# Patient Record
Sex: Male | Born: 1950 | Race: White | Hispanic: No | Marital: Married | Smoking: Former smoker
Health system: Southern US, Community
[De-identification: ages and names within clinical notes are randomized; demographics above are authoritative.]

## PROBLEM LIST (undated history)

## (undated) DIAGNOSIS — I5032 Chronic diastolic (congestive) heart failure: Secondary | ICD-10-CM

## (undated) DIAGNOSIS — I2510 Atherosclerotic heart disease of native coronary artery without angina pectoris: Secondary | ICD-10-CM

## (undated) DIAGNOSIS — Z9889 Other specified postprocedural states: Secondary | ICD-10-CM

## (undated) DIAGNOSIS — M1990 Unspecified osteoarthritis, unspecified site: Secondary | ICD-10-CM

## (undated) DIAGNOSIS — I10 Essential (primary) hypertension: Secondary | ICD-10-CM

## (undated) DIAGNOSIS — I2699 Other pulmonary embolism without acute cor pulmonale: Secondary | ICD-10-CM

## (undated) DIAGNOSIS — T8859XA Other complications of anesthesia, initial encounter: Secondary | ICD-10-CM

## (undated) DIAGNOSIS — R112 Nausea with vomiting, unspecified: Secondary | ICD-10-CM

## (undated) DIAGNOSIS — T4145XA Adverse effect of unspecified anesthetic, initial encounter: Secondary | ICD-10-CM

## (undated) DIAGNOSIS — R0602 Shortness of breath: Secondary | ICD-10-CM

## (undated) HISTORY — DX: Atherosclerotic heart disease of native coronary artery without angina pectoris: I25.10

## (undated) HISTORY — PX: ANKLE SURGERY: SHX546

## (undated) HISTORY — DX: Unspecified osteoarthritis, unspecified site: M19.90

## (undated) HISTORY — PX: FOOT SURGERY: SHX648

## (undated) HISTORY — DX: Essential (primary) hypertension: I10

## (undated) HISTORY — PX: CORONARY ANGIOPLASTY WITH STENT PLACEMENT: SHX49

## (undated) HISTORY — PX: TOTAL KNEE ARTHROPLASTY: SHX125

## (undated) HISTORY — PX: CARPAL TUNNEL RELEASE: SHX101

## (undated) HISTORY — PX: ROTATOR CUFF REPAIR: SHX139

## (undated) HISTORY — DX: Chronic diastolic (congestive) heart failure: I50.32

## (undated) HISTORY — PX: THORACOTOMY: SUR1349

## (undated) HISTORY — PX: PACEMAKER INSERTION: SHX728

## (undated) HISTORY — PX: BACK SURGERY: SHX140

---

## 2002-11-23 ENCOUNTER — Encounter: Payer: Self-pay | Admitting: Orthopaedic Surgery

## 2002-11-23 ENCOUNTER — Ambulatory Visit (HOSPITAL_COMMUNITY): Admission: RE | Admit: 2002-11-23 | Discharge: 2002-11-23 | Payer: Self-pay | Admitting: Orthopaedic Surgery

## 2002-12-26 ENCOUNTER — Encounter: Payer: Self-pay | Admitting: Orthopaedic Surgery

## 2002-12-28 ENCOUNTER — Inpatient Hospital Stay (HOSPITAL_COMMUNITY): Admission: RE | Admit: 2002-12-28 | Discharge: 2003-01-01 | Payer: Self-pay | Admitting: Orthopaedic Surgery

## 2002-12-28 ENCOUNTER — Encounter: Payer: Self-pay | Admitting: Orthopaedic Surgery

## 2003-01-01 ENCOUNTER — Encounter: Payer: Self-pay | Admitting: Orthopaedic Surgery

## 2005-06-13 ENCOUNTER — Ambulatory Visit: Payer: Self-pay | Admitting: Cardiovascular Disease

## 2005-06-14 ENCOUNTER — Ambulatory Visit: Payer: Self-pay | Admitting: Internal Medicine

## 2005-06-14 ENCOUNTER — Inpatient Hospital Stay (HOSPITAL_COMMUNITY): Admission: AD | Admit: 2005-06-14 | Discharge: 2005-06-24 | Payer: Self-pay | Admitting: Internal Medicine

## 2005-06-15 ENCOUNTER — Encounter: Payer: Self-pay | Admitting: Cardiology

## 2005-06-15 ENCOUNTER — Ambulatory Visit: Payer: Self-pay | Admitting: Cardiology

## 2006-06-12 ENCOUNTER — Ambulatory Visit: Payer: Self-pay | Admitting: Critical Care Medicine

## 2006-06-12 ENCOUNTER — Inpatient Hospital Stay (HOSPITAL_COMMUNITY): Admission: EM | Admit: 2006-06-12 | Discharge: 2006-07-14 | Payer: Self-pay | Admitting: *Deleted

## 2006-06-12 ENCOUNTER — Ambulatory Visit: Payer: Self-pay | Admitting: Cardiology

## 2006-06-14 ENCOUNTER — Encounter (INDEPENDENT_AMBULATORY_CARE_PROVIDER_SITE_OTHER): Payer: Self-pay | Admitting: *Deleted

## 2006-06-14 ENCOUNTER — Encounter: Payer: Self-pay | Admitting: Cardiology

## 2006-06-29 ENCOUNTER — Ambulatory Visit: Payer: Self-pay | Admitting: Internal Medicine

## 2006-07-01 ENCOUNTER — Encounter: Payer: Self-pay | Admitting: Cardiology

## 2006-07-20 ENCOUNTER — Ambulatory Visit: Payer: Self-pay | Admitting: Cardiology

## 2006-07-20 ENCOUNTER — Inpatient Hospital Stay (HOSPITAL_COMMUNITY): Admission: EM | Admit: 2006-07-20 | Discharge: 2006-07-25 | Payer: Self-pay | Admitting: Emergency Medicine

## 2006-07-28 ENCOUNTER — Encounter: Admission: RE | Admit: 2006-07-28 | Discharge: 2006-07-28 | Payer: Self-pay | Admitting: Thoracic Surgery

## 2006-08-02 ENCOUNTER — Ambulatory Visit: Payer: Self-pay | Admitting: Cardiology

## 2006-08-25 ENCOUNTER — Ambulatory Visit: Payer: Self-pay | Admitting: Cardiology

## 2006-09-03 ENCOUNTER — Ambulatory Visit: Payer: Self-pay | Admitting: Cardiology

## 2006-09-08 ENCOUNTER — Ambulatory Visit: Payer: Self-pay | Admitting: Cardiology

## 2006-09-08 ENCOUNTER — Encounter: Admission: RE | Admit: 2006-09-08 | Discharge: 2006-09-08 | Payer: Self-pay | Admitting: Thoracic Surgery

## 2006-09-15 ENCOUNTER — Ambulatory Visit: Payer: Self-pay | Admitting: Cardiology

## 2006-10-12 ENCOUNTER — Ambulatory Visit: Payer: Self-pay | Admitting: Internal Medicine

## 2006-10-12 ENCOUNTER — Ambulatory Visit: Payer: Self-pay | Admitting: Cardiology

## 2006-10-19 ENCOUNTER — Ambulatory Visit: Payer: Self-pay | Admitting: Cardiology

## 2006-10-26 ENCOUNTER — Ambulatory Visit: Payer: Self-pay | Admitting: Cardiology

## 2006-11-05 ENCOUNTER — Ambulatory Visit: Payer: Self-pay | Admitting: Cardiology

## 2006-11-11 ENCOUNTER — Ambulatory Visit: Payer: Self-pay | Admitting: Cardiology

## 2006-11-25 ENCOUNTER — Ambulatory Visit: Payer: Self-pay | Admitting: Cardiology

## 2006-11-26 ENCOUNTER — Ambulatory Visit: Payer: Self-pay | Admitting: Thoracic Surgery

## 2006-11-30 ENCOUNTER — Encounter: Payer: Self-pay | Admitting: Internal Medicine

## 2006-12-08 ENCOUNTER — Ambulatory Visit: Payer: Self-pay | Admitting: Internal Medicine

## 2006-12-08 ENCOUNTER — Ambulatory Visit (HOSPITAL_COMMUNITY): Admission: RE | Admit: 2006-12-08 | Discharge: 2006-12-08 | Payer: Self-pay | Admitting: Neurosurgery

## 2006-12-21 ENCOUNTER — Ambulatory Visit: Payer: Self-pay | Admitting: Thoracic Surgery

## 2006-12-21 ENCOUNTER — Encounter: Admission: RE | Admit: 2006-12-21 | Discharge: 2006-12-21 | Payer: Self-pay | Admitting: Thoracic Surgery

## 2006-12-29 ENCOUNTER — Ambulatory Visit: Payer: Self-pay | Admitting: Cardiology

## 2006-12-29 LAB — CONVERTED CEMR LAB
BUN: 23 mg/dL (ref 6–23)
CO2: 31 meq/L (ref 19–32)
Calcium: 9.5 mg/dL (ref 8.4–10.5)
Chloride: 102 meq/L (ref 96–112)
Creatinine, Ser: 0.8 mg/dL (ref 0.4–1.5)
GFR calc Af Amer: 129 mL/min
GFR calc non Af Amer: 107 mL/min
Glucose, Bld: 106 mg/dL — ABNORMAL HIGH (ref 70–99)
Potassium: 4.2 meq/L (ref 3.5–5.1)
Sodium: 140 meq/L (ref 135–145)

## 2007-01-11 ENCOUNTER — Ambulatory Visit: Payer: Self-pay | Admitting: Cardiology

## 2007-01-11 ENCOUNTER — Ambulatory Visit: Payer: Self-pay | Admitting: Internal Medicine

## 2007-01-11 DIAGNOSIS — S82899A Other fracture of unspecified lower leg, initial encounter for closed fracture: Secondary | ICD-10-CM | POA: Insufficient documentation

## 2007-01-11 DIAGNOSIS — M009 Pyogenic arthritis, unspecified: Secondary | ICD-10-CM | POA: Insufficient documentation

## 2007-01-14 ENCOUNTER — Encounter: Payer: Self-pay | Admitting: Internal Medicine

## 2007-01-21 ENCOUNTER — Ambulatory Visit: Payer: Self-pay | Admitting: Internal Medicine

## 2007-01-31 ENCOUNTER — Ambulatory Visit: Payer: Self-pay | Admitting: Cardiology

## 2007-01-31 ENCOUNTER — Ambulatory Visit: Payer: Self-pay | Admitting: Cardiovascular Disease

## 2007-02-14 ENCOUNTER — Ambulatory Visit: Payer: Self-pay | Admitting: Cardiology

## 2007-02-28 ENCOUNTER — Ambulatory Visit: Payer: Self-pay | Admitting: Cardiology

## 2007-03-28 ENCOUNTER — Ambulatory Visit: Payer: Self-pay | Admitting: Cardiology

## 2007-04-11 ENCOUNTER — Ambulatory Visit: Payer: Self-pay | Admitting: Internal Medicine

## 2007-05-10 ENCOUNTER — Ambulatory Visit: Payer: Self-pay | Admitting: Cardiology

## 2007-05-10 LAB — CONVERTED CEMR LAB
BUN: 13 mg/dL (ref 6–23)
Calcium: 9.4 mg/dL (ref 8.4–10.5)
GFR calc Af Amer: 129 mL/min
Potassium: 3.8 meq/L (ref 3.5–5.1)
Sodium: 139 meq/L (ref 135–145)

## 2007-05-12 ENCOUNTER — Ambulatory Visit: Payer: Self-pay | Admitting: Cardiology

## 2007-06-03 ENCOUNTER — Ambulatory Visit: Payer: Self-pay | Admitting: Pulmonary Disease

## 2007-06-06 ENCOUNTER — Ambulatory Visit: Payer: Self-pay | Admitting: Pulmonary Disease

## 2007-06-06 LAB — CONVERTED CEMR LAB
Albumin: 3.8 g/dL (ref 3.5–5.2)
Angiotensin 1 Converting Enzyme: 9 units/L (ref 9–67)
BUN: 14 mg/dL (ref 6–23)
Bilirubin, Direct: 0.1 mg/dL (ref 0.0–0.3)
Chloride: 104 meq/L (ref 96–112)
Creatinine, Ser: 0.7 mg/dL (ref 0.4–1.5)
Eosinophils Absolute: 1.3 10*3/uL — ABNORMAL HIGH (ref 0.0–0.6)
Eosinophils Relative: 15.1 % — ABNORMAL HIGH (ref 0.0–5.0)
GFR calc Af Amer: 151 mL/min
GFR calc non Af Amer: 124 mL/min
Glucose, Bld: 105 mg/dL — ABNORMAL HIGH (ref 70–99)
HCT: 34.4 % — ABNORMAL LOW (ref 39.0–52.0)
MCV: 91.2 fL (ref 78.0–100.0)
Monocytes Absolute: 0.7 10*3/uL (ref 0.2–0.7)
Neutro Abs: 4.6 10*3/uL (ref 1.4–7.7)
Neutrophils Relative %: 53.5 % (ref 43.0–77.0)
Platelets: 170 10*3/uL (ref 150–400)
Prothrombin Time: 13.3 s (ref 10.9–13.3)
Sed Rate: 20 mm/hr (ref 0–20)
Total Bilirubin: 0.8 mg/dL (ref 0.3–1.2)
WBC: 8.7 10*3/uL (ref 4.5–10.5)
aPTT: 26.5 s (ref 21.7–29.8)

## 2007-06-09 ENCOUNTER — Ambulatory Visit: Payer: Self-pay | Admitting: Pulmonary Disease

## 2007-06-09 ENCOUNTER — Encounter: Payer: Self-pay | Admitting: Pulmonary Disease

## 2007-06-09 ENCOUNTER — Ambulatory Visit: Admission: RE | Admit: 2007-06-09 | Discharge: 2007-06-09 | Payer: Self-pay | Admitting: Pulmonary Disease

## 2007-06-20 ENCOUNTER — Ambulatory Visit: Payer: Self-pay

## 2007-06-20 ENCOUNTER — Ambulatory Visit: Payer: Self-pay | Admitting: Cardiology

## 2007-06-22 ENCOUNTER — Ambulatory Visit: Payer: Self-pay | Admitting: Cardiology

## 2007-06-22 LAB — CONVERTED CEMR LAB
BUN: 15 mg/dL (ref 6–23)
Basophils Absolute: 0.1 10*3/uL (ref 0.0–0.1)
CO2: 25 meq/L (ref 19–32)
Calcium: 9.1 mg/dL (ref 8.4–10.5)
Chloride: 106 meq/L (ref 96–112)
Eosinophils Relative: 12.5 % — ABNORMAL HIGH (ref 0.0–5.0)
GFR calc Af Amer: 151 mL/min
HCT: 33.7 % — ABNORMAL LOW (ref 39.0–52.0)
Hemoglobin: 11.6 g/dL — ABNORMAL LOW (ref 13.0–17.0)
INR: 2.8 — ABNORMAL HIGH (ref 0.8–1.0)
MCHC: 34.5 g/dL (ref 30.0–36.0)
Monocytes Absolute: 0.8 10*3/uL — ABNORMAL HIGH (ref 0.2–0.7)
Platelets: 192 10*3/uL (ref 150–400)
Potassium: 4.1 meq/L (ref 3.5–5.1)
Prothrombin Time: 20.9 s — ABNORMAL HIGH (ref 10.9–13.3)
Sodium: 137 meq/L (ref 135–145)
aPTT: 38.4 s — ABNORMAL HIGH (ref 21.7–29.8)

## 2007-06-27 ENCOUNTER — Inpatient Hospital Stay (HOSPITAL_COMMUNITY): Admission: RE | Admit: 2007-06-27 | Discharge: 2007-06-28 | Payer: Self-pay | Admitting: Cardiology

## 2007-06-27 ENCOUNTER — Ambulatory Visit: Payer: Self-pay | Admitting: Cardiology

## 2007-07-05 ENCOUNTER — Ambulatory Visit: Payer: Self-pay | Admitting: Cardiology

## 2007-07-12 ENCOUNTER — Ambulatory Visit: Payer: Self-pay | Admitting: Cardiology

## 2007-08-08 ENCOUNTER — Ambulatory Visit: Payer: Self-pay | Admitting: Cardiology

## 2007-09-19 ENCOUNTER — Inpatient Hospital Stay (HOSPITAL_COMMUNITY): Admission: RE | Admit: 2007-09-19 | Discharge: 2007-09-20 | Payer: Self-pay | Admitting: Neurosurgery

## 2007-10-24 ENCOUNTER — Encounter
Admission: RE | Admit: 2007-10-24 | Discharge: 2008-01-22 | Payer: Self-pay | Admitting: Physical Medicine & Rehabilitation

## 2007-10-24 ENCOUNTER — Ambulatory Visit: Payer: Self-pay | Admitting: Physical Medicine & Rehabilitation

## 2007-10-25 ENCOUNTER — Encounter: Payer: Self-pay | Admitting: Internal Medicine

## 2007-11-04 ENCOUNTER — Ambulatory Visit: Payer: Self-pay | Admitting: Cardiology

## 2007-11-25 ENCOUNTER — Ambulatory Visit: Payer: Self-pay | Admitting: Physical Medicine & Rehabilitation

## 2007-12-12 ENCOUNTER — Ambulatory Visit: Payer: Self-pay | Admitting: Physical Medicine & Rehabilitation

## 2007-12-26 ENCOUNTER — Ambulatory Visit: Payer: Self-pay | Admitting: Physical Medicine & Rehabilitation

## 2008-01-02 ENCOUNTER — Ambulatory Visit: Payer: Self-pay | Admitting: Physical Medicine & Rehabilitation

## 2008-01-24 ENCOUNTER — Encounter: Payer: Self-pay | Admitting: Internal Medicine

## 2008-01-27 ENCOUNTER — Encounter
Admission: RE | Admit: 2008-01-27 | Discharge: 2008-04-09 | Payer: Self-pay | Admitting: Physical Medicine & Rehabilitation

## 2008-01-30 ENCOUNTER — Ambulatory Visit: Payer: Self-pay | Admitting: Physical Medicine & Rehabilitation

## 2008-02-08 ENCOUNTER — Ambulatory Visit: Payer: Self-pay | Admitting: Cardiology

## 2008-02-08 LAB — CONVERTED CEMR LAB
ALT: 28 units/L (ref 0–53)
Albumin: 3.9 g/dL (ref 3.5–5.2)
Alkaline Phosphatase: 67 units/L (ref 39–117)
BUN: 13 mg/dL (ref 6–23)
Basophils Absolute: 0 10*3/uL (ref 0.0–0.1)
Basophils Relative: 0.2 % (ref 0.0–1.0)
Bilirubin, Direct: 0.1 mg/dL (ref 0.0–0.3)
Calcium: 9.5 mg/dL (ref 8.4–10.5)
Chloride: 102 meq/L (ref 96–112)
Eosinophils Relative: 5.1 % — ABNORMAL HIGH (ref 0.0–5.0)
GFR calc Af Amer: 129 mL/min
Glucose, Bld: 116 mg/dL — ABNORMAL HIGH (ref 70–99)
Hemoglobin: 14 g/dL (ref 13.0–17.0)
MCHC: 33.4 g/dL (ref 30.0–36.0)
MCV: 90.7 fL (ref 78.0–100.0)
Monocytes Relative: 7.1 % (ref 3.0–12.0)
Platelets: 197 10*3/uL (ref 150–400)
Total Bilirubin: 1 mg/dL (ref 0.3–1.2)
WBC: 8.7 10*3/uL (ref 4.5–10.5)

## 2008-02-15 IMAGING — CR DG THORACIC SPINE 1V
1 series · 1 of 1 positions shown · non-contrast
Comparison: none

CLINICAL DATA: Intraop film for localization.
THORACIC SPINE - 1 VIEW:
AP view of the thoracic spine was obtained.  Multilevel thoracic spine degenerative disc space narrowing is seen.  No gross compression fracture is appreciated on this AP projection, although evaluation is limited without a lateral view.  Atelectasis or scarring is noted in both lung bases.

[t t-spine a.p. *]
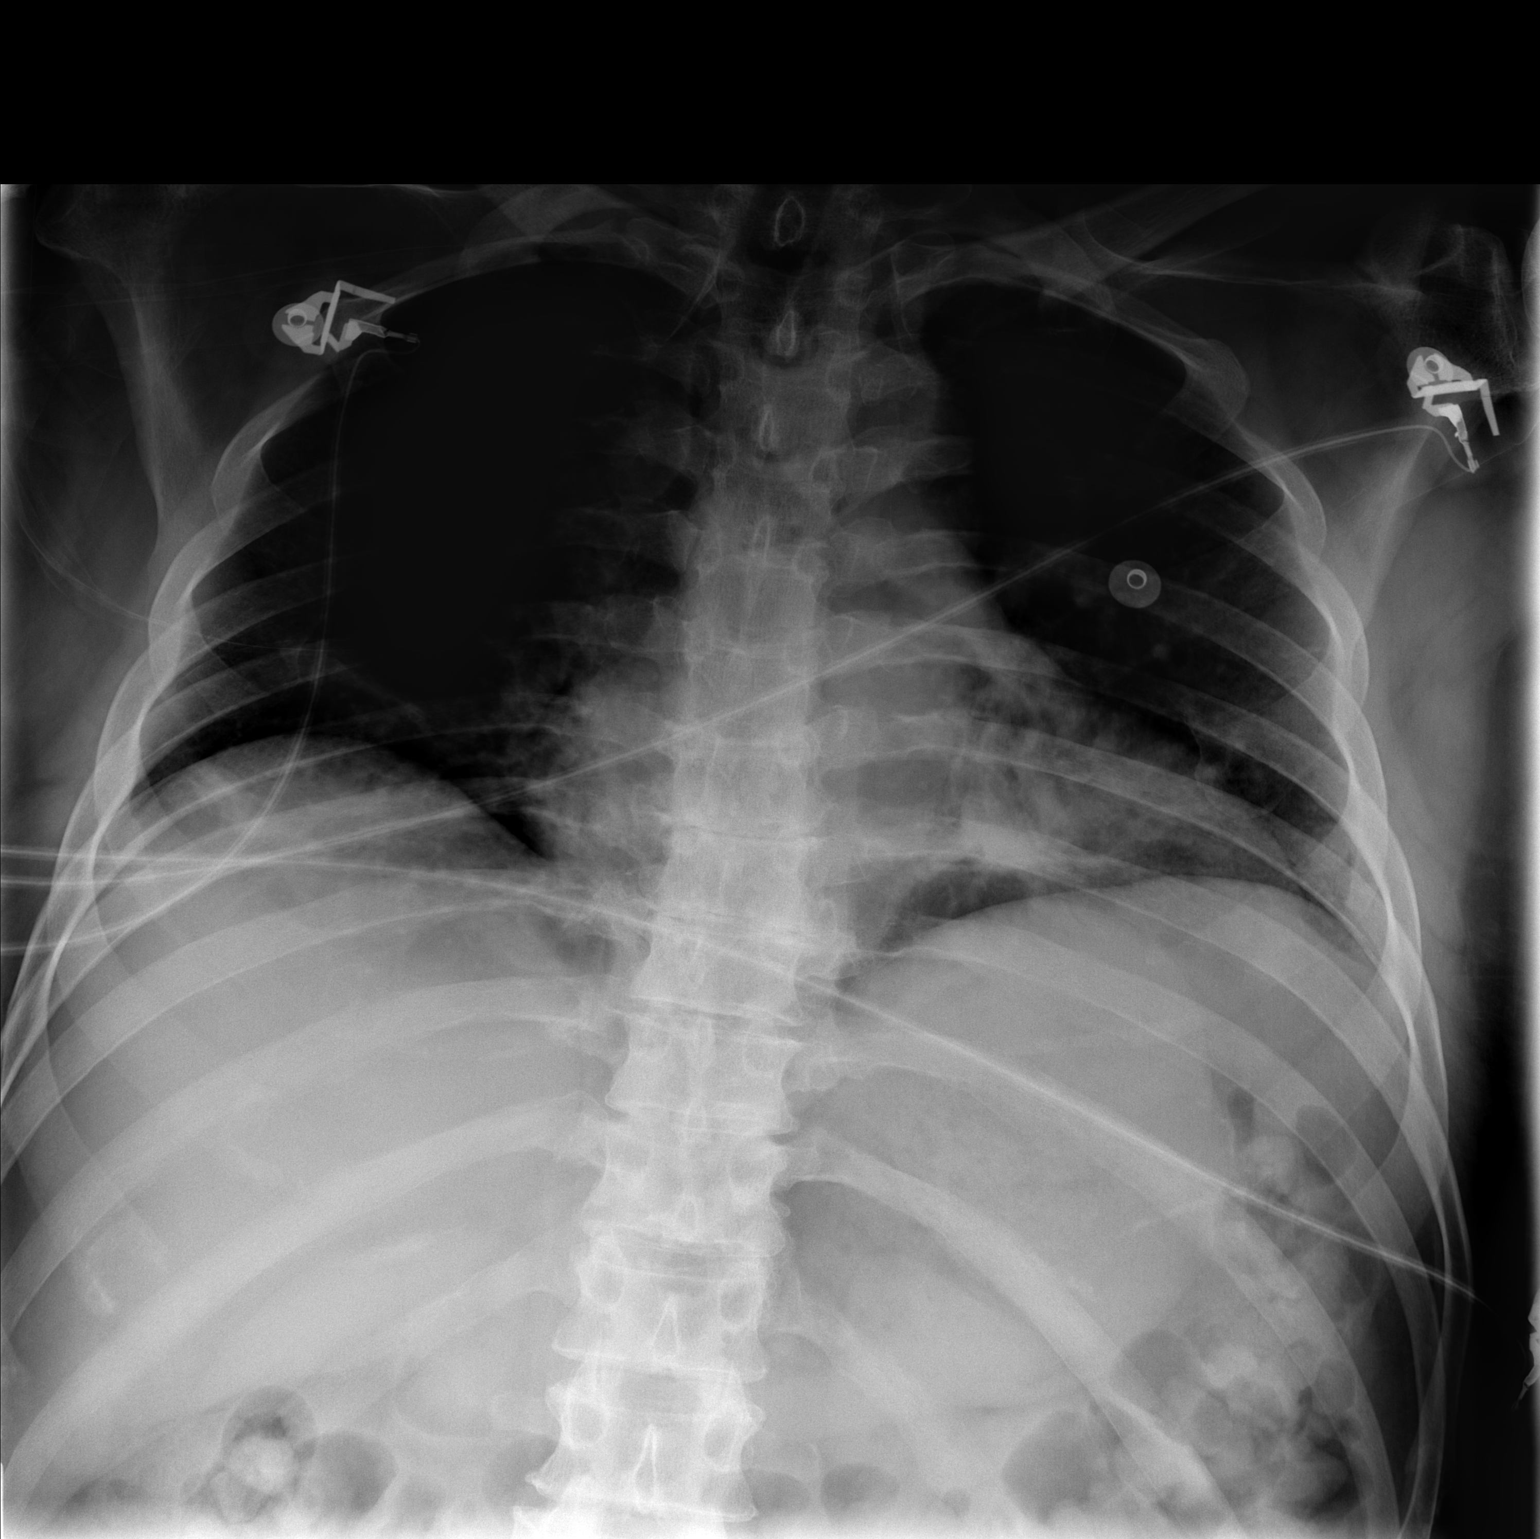

[1 of 1 positions shown; findings below may reference images not displayed]

IMPRESSION: As above.

## 2008-02-16 ENCOUNTER — Encounter: Admission: RE | Admit: 2008-02-16 | Discharge: 2008-05-16 | Payer: Self-pay | Admitting: Anesthesiology

## 2008-02-16 IMAGING — CR DG CHEST 1V PORT
1 series · 1 of 1 positions shown · non-contrast
Comparison: none

CLINICAL DATA: HNP, central line, and CT placement. 
PORTABLE CHEST - 1 VIEW AT 4488 HOURS:

[view not recorded]
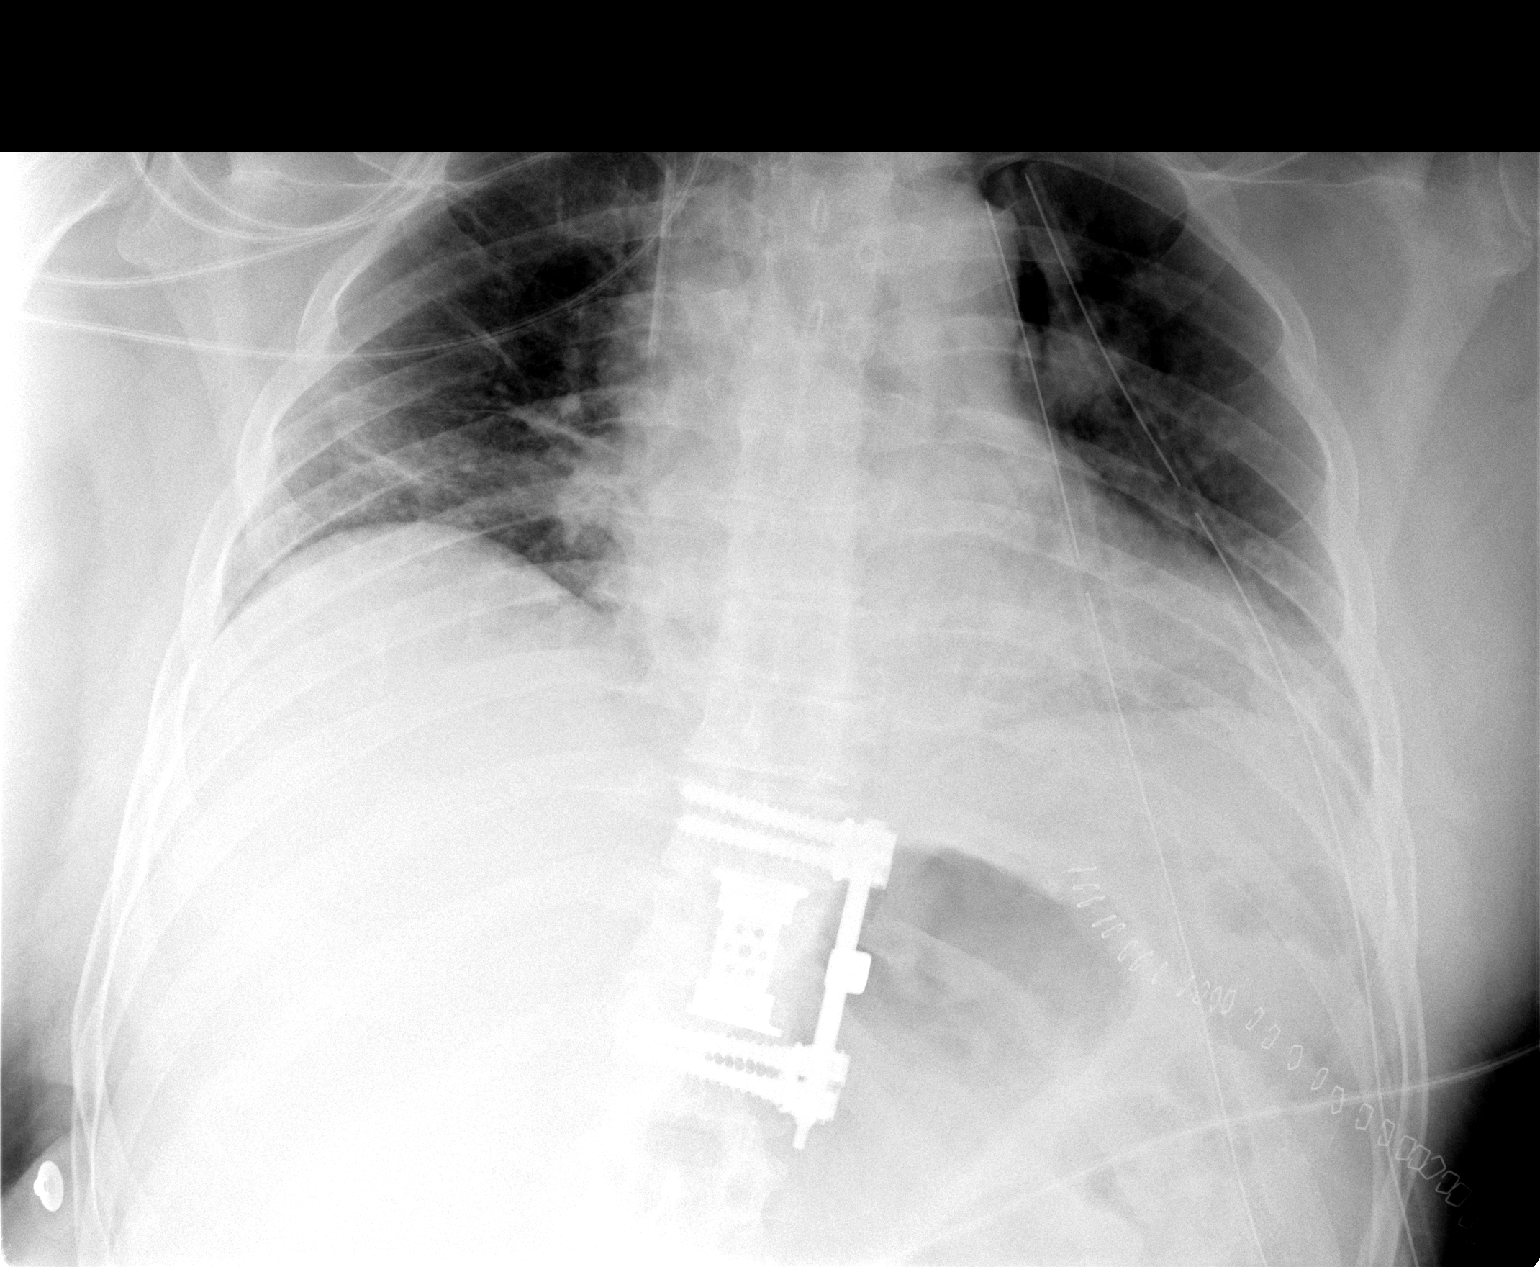

[1 of 1 positions shown; findings below may reference images not displayed]

FINDINGS: There is a corpectomy plug at T11.  Paired horizontally oriented trans-vertebral screws at T10 and T12.  Two left pleural chest tubes.  Left lower chest wall staples. Right jugular CVC tip is in the SVC.  Subsegmental atelectasis at the bases.  No pneumothorax.
IMPRESSION: Subsegmental atelectasis at the bases, mainly on the right.  Status-post T11 corpectomy. See comments above.

## 2008-02-17 IMAGING — CR DG CHEST 1V PORT
1 series · 1 of 1 positions shown · non-contrast
Comparison: Yesterday.

CLINICAL DATA: Pneumonia.

PORTABLE CHEST - 1 VIEW

[view not recorded]
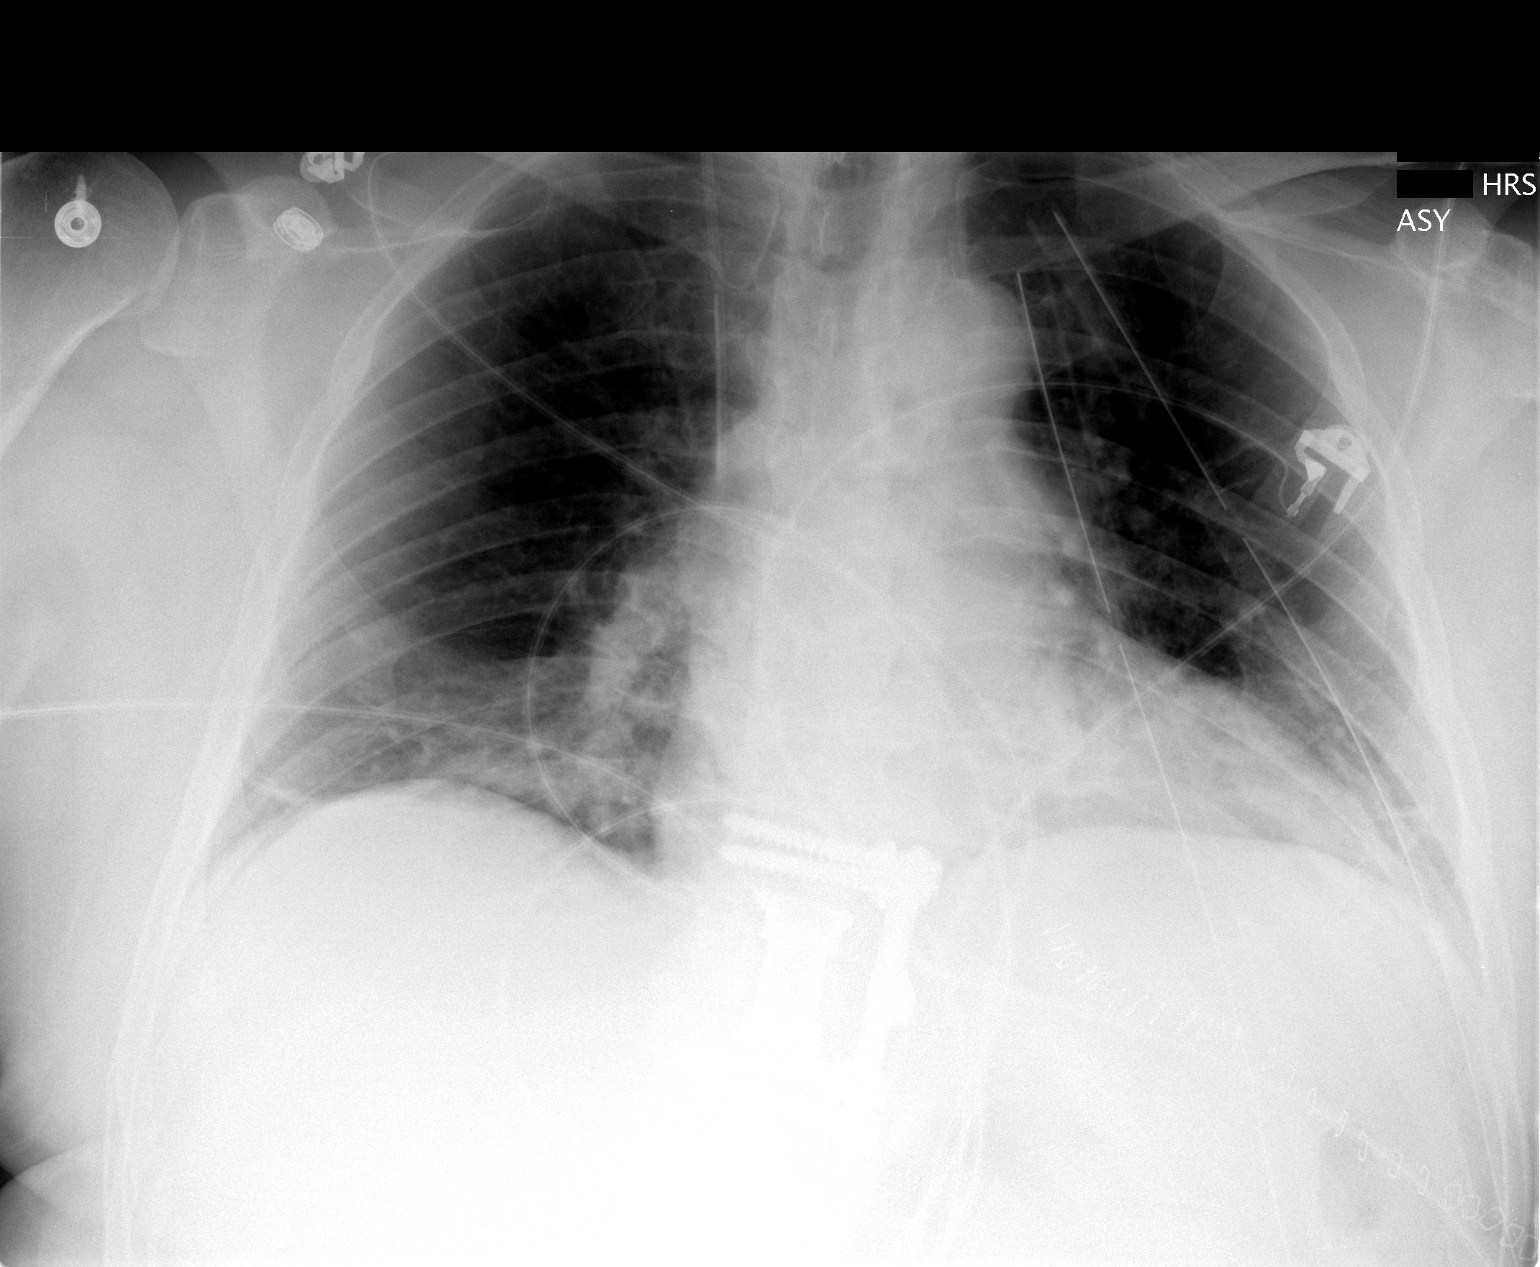

[1 of 1 positions shown; findings below may reference images not displayed]

FINDINGS: Two left chest tubes are unchanged. A tiny left apical pneumothorax
is currently noted. This occupies less than 5% of the left hemithorax. No
significant change in amount of bibasilar atelectasis. Stable hardware fixation
of the lower thoracic spine.  Stable right jugular catheter.

IMPRESSION

1. Tiny left apical pneumothorax.
2. Stable mild bibasilar atelectasis.

## 2008-02-20 IMAGING — CR DG CHEST 1V PORT
1 series · 1 of 1 positions shown · non-contrast
Comparison: none

06/18/06 ? REVISED REPORT:  The comparison date for this exam was 06/17/06.  Also the following information should have been included in this report, in both the body of report and in the impression section:  ?It would be difficult to exclude a tiny left apical pneumothorax.?
CLINICAL DATA: Herniated disc.  Follow-up possible pneumothorax. 
 PORTABLE CHEST - 1 VIEW, 06/18/06, 3302 HOURS:

[view not recorded]
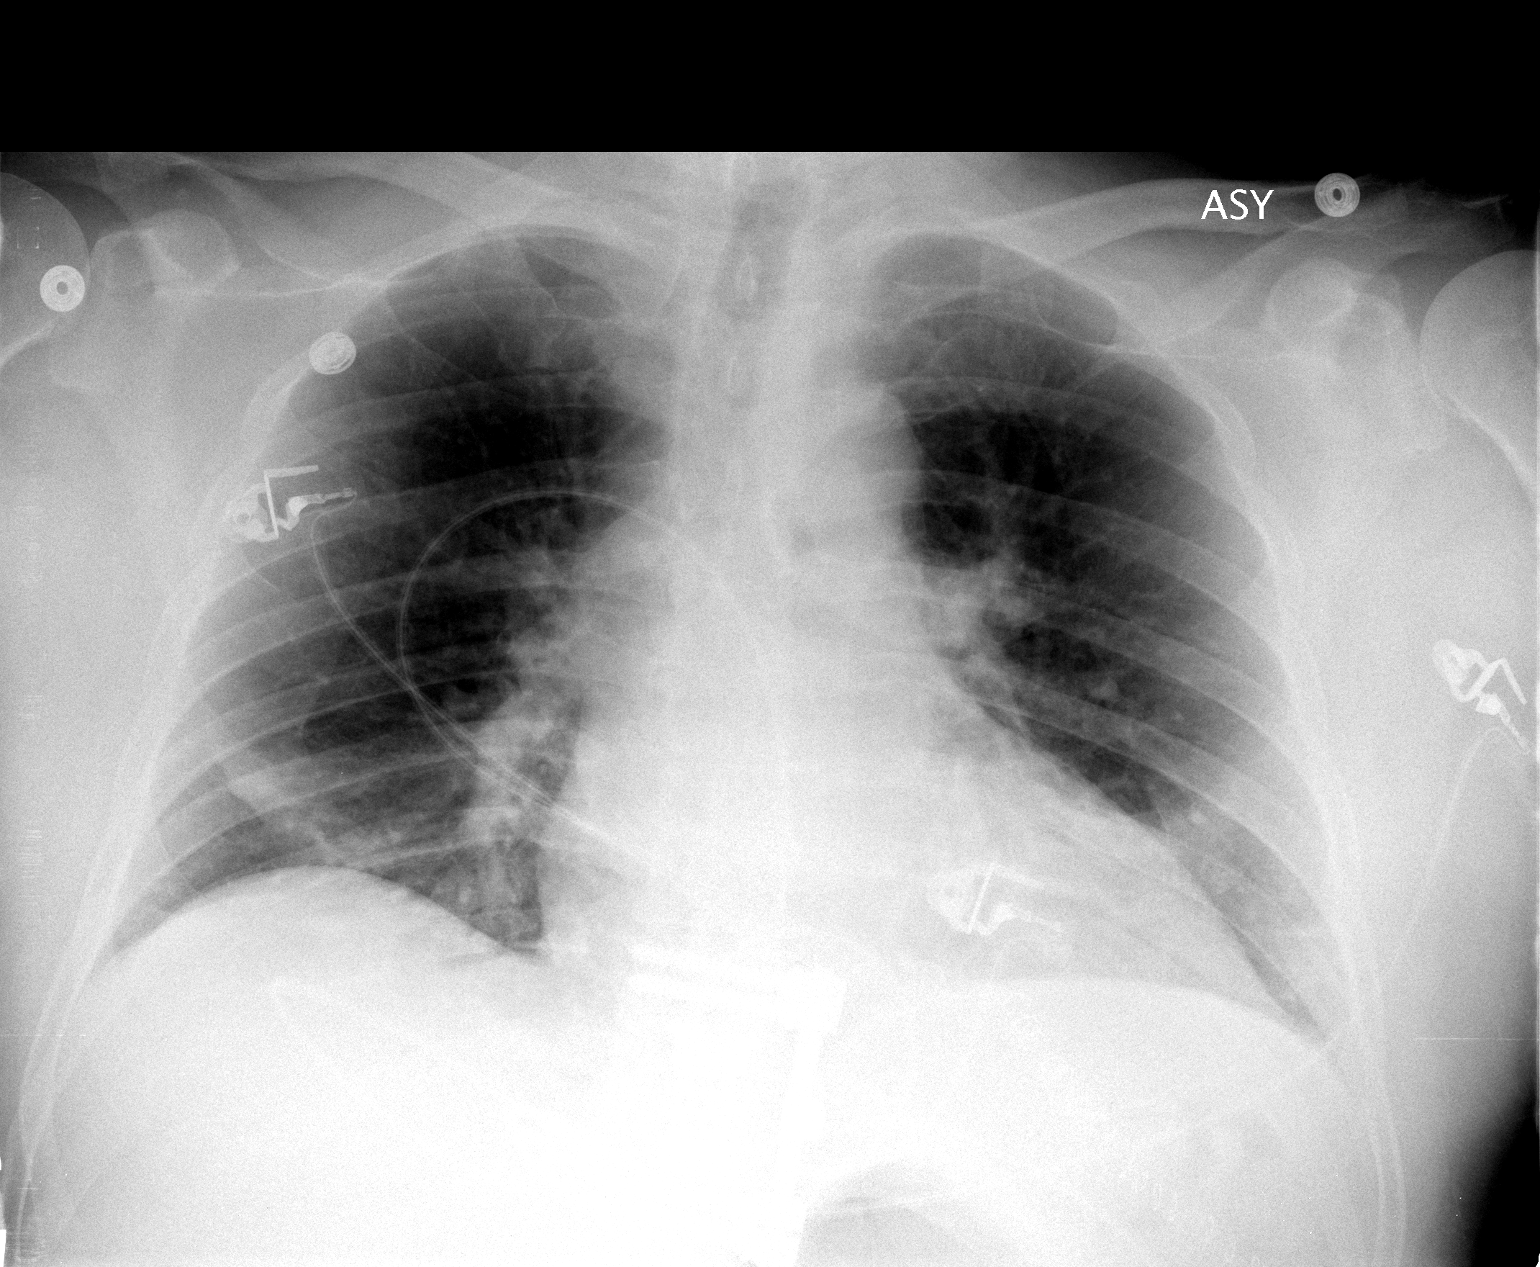

[1 of 1 positions shown; findings below may reference images not displayed]

FINDINGS: Right central venous catheter has been removed.  No pneumothorax is seen.  There is mild basilar atelectasis present.  Cardiomegaly is stable.
IMPRESSION: No pneumothorax.  Mild basilar atelectasis.

## 2008-02-21 ENCOUNTER — Ambulatory Visit: Payer: Self-pay | Admitting: Anesthesiology

## 2008-02-23 IMAGING — CR DG CHEST 1V PORT
1 series · 1 of 1 positions shown · non-contrast
Comparison: 06/19/2006

CLINICAL DATA: Shortness of breath

PORTABLE CHEST - 1 VIEW:

[view not recorded]
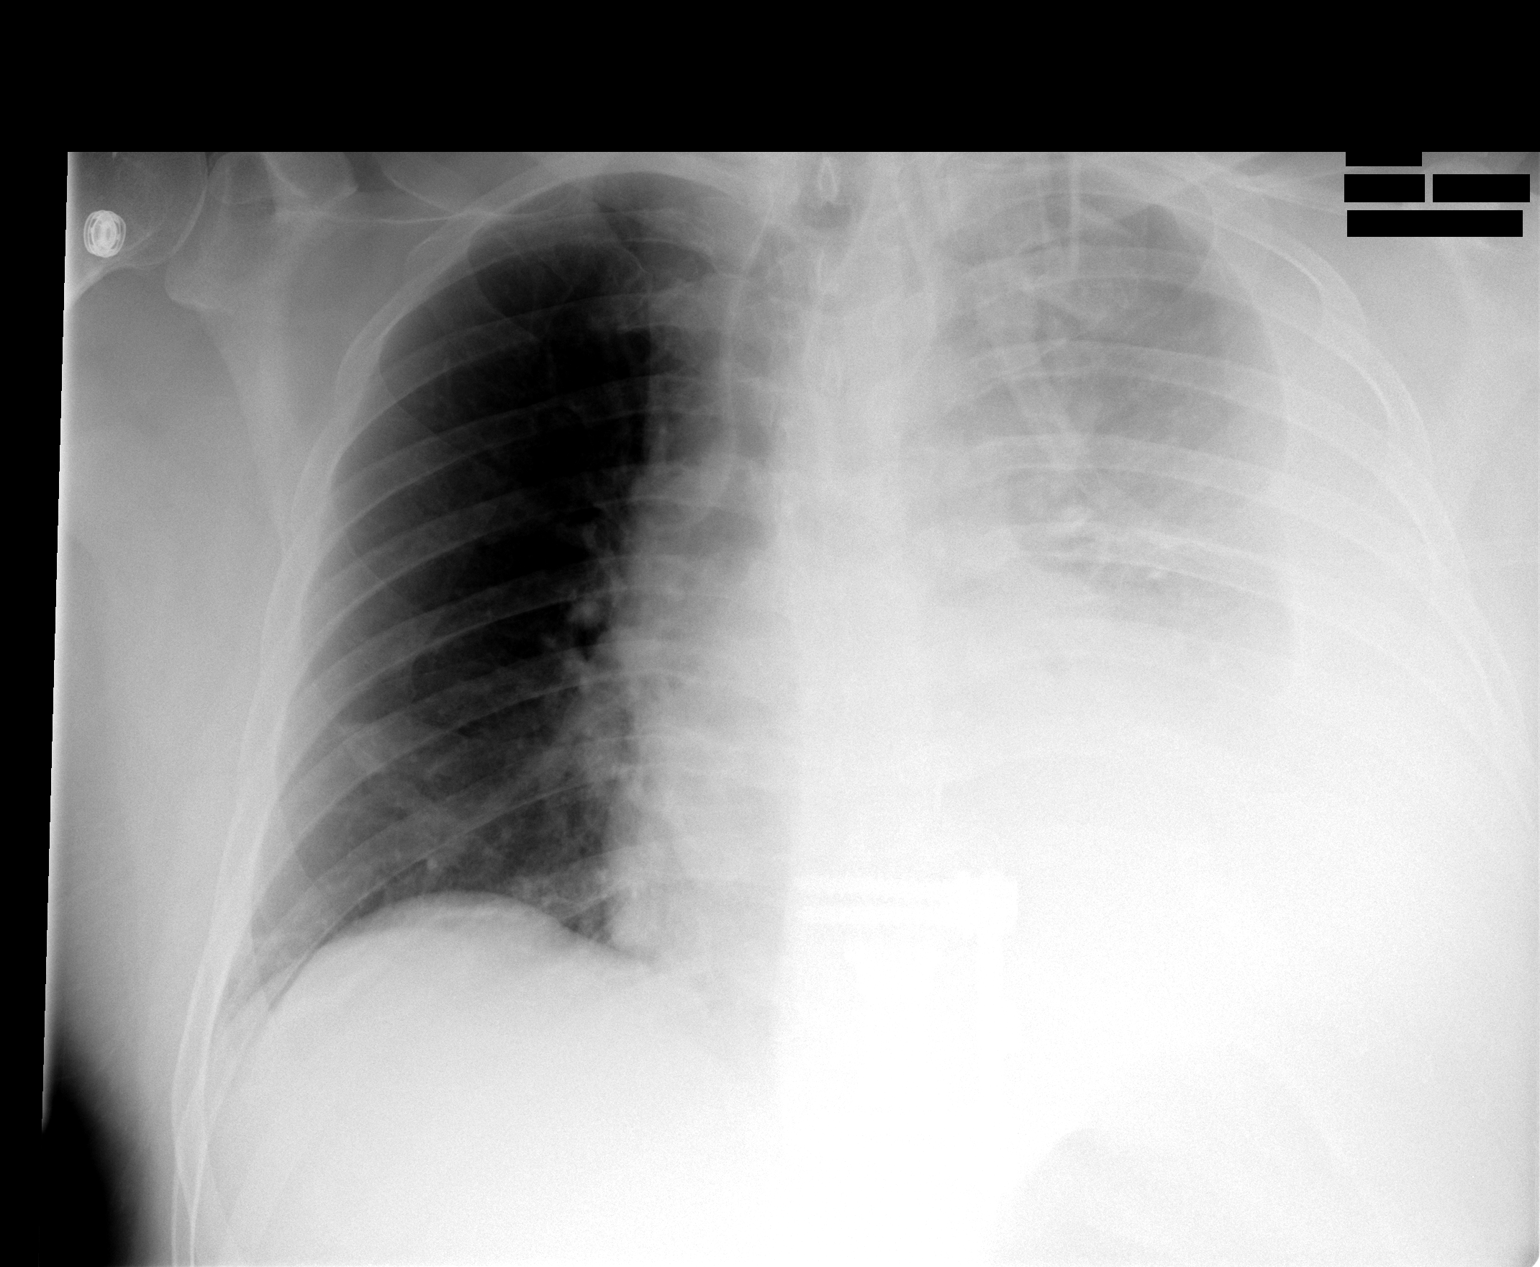

[1 of 1 positions shown; findings below may reference images not displayed]

FINDINGS: There is a new large left pleural effusion. Atelectasis noted
throughout the left lung. Right lung clear. There is mild cardiomegaly.
IMPRESSION: New large left pleural effusion and left lung atelectasis.

Cardiomegaly

## 2008-03-27 ENCOUNTER — Ambulatory Visit: Payer: Self-pay | Admitting: Anesthesiology

## 2008-04-09 ENCOUNTER — Ambulatory Visit: Payer: Self-pay | Admitting: Physical Medicine & Rehabilitation

## 2008-05-04 ENCOUNTER — Encounter
Admission: RE | Admit: 2008-05-04 | Discharge: 2008-08-02 | Payer: Self-pay | Admitting: Physical Medicine & Rehabilitation

## 2008-05-08 ENCOUNTER — Ambulatory Visit: Payer: Self-pay | Admitting: Physical Medicine & Rehabilitation

## 2008-06-07 ENCOUNTER — Ambulatory Visit: Payer: Self-pay | Admitting: Physical Medicine & Rehabilitation

## 2008-06-11 ENCOUNTER — Emergency Department (HOSPITAL_COMMUNITY): Admission: EM | Admit: 2008-06-11 | Discharge: 2008-06-11 | Payer: Self-pay | Admitting: Emergency Medicine

## 2008-07-06 ENCOUNTER — Ambulatory Visit: Payer: Self-pay | Admitting: Physical Medicine & Rehabilitation

## 2008-07-20 ENCOUNTER — Ambulatory Visit: Payer: Self-pay | Admitting: Cardiovascular Disease

## 2008-07-23 ENCOUNTER — Ambulatory Visit: Payer: Self-pay | Admitting: Cardiovascular Disease

## 2008-07-23 ENCOUNTER — Inpatient Hospital Stay (HOSPITAL_COMMUNITY): Admission: RE | Admit: 2008-07-23 | Discharge: 2008-07-24 | Payer: Self-pay | Admitting: Cardiovascular Disease

## 2008-08-02 ENCOUNTER — Ambulatory Visit: Payer: Self-pay | Admitting: Physical Medicine & Rehabilitation

## 2008-08-06 ENCOUNTER — Ambulatory Visit: Payer: Self-pay | Admitting: Cardiovascular Disease

## 2008-08-27 ENCOUNTER — Encounter
Admission: RE | Admit: 2008-08-27 | Discharge: 2008-08-28 | Payer: Self-pay | Admitting: Physical Medicine & Rehabilitation

## 2008-08-28 ENCOUNTER — Ambulatory Visit: Payer: Self-pay | Admitting: Physical Medicine & Rehabilitation

## 2008-11-28 ENCOUNTER — Encounter
Admission: RE | Admit: 2008-11-28 | Discharge: 2009-02-26 | Payer: Self-pay | Admitting: Physical Medicine & Rehabilitation

## 2008-12-04 ENCOUNTER — Ambulatory Visit: Payer: Self-pay | Admitting: Physical Medicine & Rehabilitation

## 2009-01-01 ENCOUNTER — Ambulatory Visit: Payer: Self-pay | Admitting: Physical Medicine & Rehabilitation

## 2009-01-22 ENCOUNTER — Encounter (INDEPENDENT_AMBULATORY_CARE_PROVIDER_SITE_OTHER): Payer: Self-pay | Admitting: *Deleted

## 2009-02-05 ENCOUNTER — Ambulatory Visit: Payer: Self-pay | Admitting: Physical Medicine & Rehabilitation

## 2009-02-11 ENCOUNTER — Ambulatory Visit: Payer: Self-pay | Admitting: Cardiovascular Disease

## 2009-02-11 DIAGNOSIS — I2510 Atherosclerotic heart disease of native coronary artery without angina pectoris: Secondary | ICD-10-CM | POA: Insufficient documentation

## 2009-02-11 DIAGNOSIS — I10 Essential (primary) hypertension: Secondary | ICD-10-CM

## 2009-02-15 ENCOUNTER — Encounter: Payer: Self-pay | Admitting: Cardiovascular Disease

## 2009-02-28 ENCOUNTER — Encounter
Admission: RE | Admit: 2009-02-28 | Discharge: 2009-05-29 | Payer: Self-pay | Admitting: Physical Medicine & Rehabilitation

## 2009-03-07 ENCOUNTER — Ambulatory Visit: Payer: Self-pay | Admitting: Physical Medicine & Rehabilitation

## 2009-04-03 ENCOUNTER — Ambulatory Visit: Payer: Self-pay | Admitting: Physical Medicine & Rehabilitation

## 2009-04-25 ENCOUNTER — Telehealth: Payer: Self-pay | Admitting: Cardiovascular Disease

## 2009-06-24 ENCOUNTER — Telehealth: Payer: Self-pay | Admitting: Cardiovascular Disease

## 2009-07-12 ENCOUNTER — Telehealth: Payer: Self-pay | Admitting: Cardiovascular Disease

## 2009-07-22 ENCOUNTER — Telehealth: Payer: Self-pay | Admitting: Cardiovascular Disease

## 2009-08-01 ENCOUNTER — Ambulatory Visit: Payer: Self-pay | Admitting: Cardiovascular Disease

## 2009-10-21 ENCOUNTER — Telehealth: Payer: Self-pay | Admitting: Cardiovascular Disease

## 2009-12-02 ENCOUNTER — Telehealth: Payer: Self-pay | Admitting: Cardiovascular Disease

## 2010-02-03 ENCOUNTER — Ambulatory Visit: Payer: Self-pay | Admitting: Cardiovascular Disease

## 2010-03-10 ENCOUNTER — Encounter: Admission: RE | Admit: 2010-03-10 | Discharge: 2010-03-10 | Payer: Self-pay | Admitting: General Surgery

## 2010-06-04 ENCOUNTER — Telehealth: Payer: Self-pay | Admitting: Cardiovascular Disease

## 2010-08-14 ENCOUNTER — Ambulatory Visit: Payer: Self-pay | Admitting: Cardiovascular Disease

## 2010-09-04 DIAGNOSIS — E785 Hyperlipidemia, unspecified: Secondary | ICD-10-CM | POA: Insufficient documentation

## 2010-09-28 LAB — CONVERTED CEMR LAB
AST: 20 units/L (ref 0–37)
Albumin: 3.5 g/dL (ref 3.5–5.2)
Alkaline Phosphatase: 57 units/L (ref 39–117)
Bilirubin, Direct: 0.1 mg/dL (ref 0.0–0.3)
HDL: 32.9 mg/dL — ABNORMAL LOW (ref >39.00)
Total Bilirubin: 0.9 mg/dL (ref 0.3–1.2)
Total CK: 67 units/L (ref 7–232)
Total Protein: 7.3 g/dL (ref 6.0–8.3)
Triglycerides: 121 mg/dL (ref 0.0–149.0)

## 2010-09-30 NOTE — Progress Notes (Signed)
Summary: pharmacy have questions about medications / KLOR-CON 10 10 MEQ C   Phone Note Refill Request Call back at [phone]   Caller: [facility] # ref [id number] Summary of Call: pharmacy has questions about medications Initial call taken by: Judie Grieve,  October 21, 2009 2:56 PM  Follow-up for Phone Call        Per pt calling - [facility] has question regarding KLOR-CON 10 10 MEQ CR-TABS 1 tab once daily . [phone]/ ref # [phone] / pt phone # [phone] Lorne Skeens  October 21, 2009 3:37 PM  Called [facility] and clarified Klor-Con orders.  Called pt and let him know this.  Pt then said he had received a call from [facility] about his NTG not being available. I told pt I would check on this for him.  I called [facility] and confirmed they have active order for NTG but it is not covered by his insurance.  They said pt would need to call customer service at [phone] (option 1).  Pt notified of this.  He will call customer service.  I told pt to call us back if help needed and we would try and assist with this if needed. Follow-up by: Dossie Arbour, RN, BSN,  October 21, 2009 5:39 PM

## 2010-09-30 NOTE — Progress Notes (Signed)
Summary: rx NTG   Phone Note Refill Request Message from:  Patient on December 02, 2009 10:57 AM  Refills Requested: Medication #1:  NITROGLYCERIN 0.4 MG SUBL One tablet under tongue every 5 minutes as needed for chest pain---may repeat times three [facility] TO [facility]  [address] [phone] PT WOULD LIKE A CALL WHEN THE REFILL HAS BEEN CALLED IN.  Initial call taken by: Judie Grieve,  December 02, 2009 10:58 AM    Prescriptions: NITROGLYCERIN 0.4 MG SUBL (NITROGLYCERIN) One tablet under tongue every 5 minutes as needed for chest pain---may repeat times three  #25 x 9   Entered by:   Danielle Rankin, CMA   Authorized by:   Verne Carrow, MD   Signed by:   Danielle Rankin, CMA on 12/02/2009   Method used:   [facility] to        [facility]  [address] [id number]       [address]       [place], [place]  [address]       Ph: [phone] or [phone]       Fax: [phone]   RxID:   [id number]

## 2010-09-30 NOTE — Progress Notes (Signed)
Summary: RX SENT TO [facility] TODAY   Phone Note Refill Request Message from:  Patient on June 04, 2010 9:46 AM  Refills Requested: Medication #1:  PRAVASTATIN SODIUM 80 MG TABS Take one tablet by mouth daily at bedtime [facility] [phone]  Initial call taken by: Judie Grieve,  June 04, 2010 9:47 AM    Prescriptions: PRAVASTATIN SODIUM 80 MG TABS (PRAVASTATIN SODIUM) Take one tablet by mouth daily at bedtime  #90 x 3   Entered by:   Danielle Rankin, CMA   Authorized by:   Verne Carrow, MD   Signed by:   Danielle Rankin, CMA on 06/04/2010   Method used:   Faxed to ...       [facility] (mail-order)             , [place]         Ph: [phone]       Fax: [phone]   RxID:   [id number]

## 2010-09-30 NOTE — Assessment & Plan Note (Signed)
Summary: 6 month rov.sl  Medications Added KLOR-CON 10 10 MEQ CR-TABS (POTASSIUM CHLORIDE) take one tablet once daily-pt has been taking two times a day for the last week ASPIRIN 81 MG TABS (ASPIRIN) once daily DOXYCYCLINE HYCLATE 100 MG CAPS (DOXYCYCLINE HYCLATE) take 2 capsules daily      Allergies Added: NKDA  Visit Type:  Follow-up Primary Provider:  Finis Bud MD  CC:  6 MONTH ROV. Marland Kitchen  History of Present Illness: 60 yo WM with history of CAD s/p two drug eluting stents to RCA 11/09, HTN, OA here today for routine follow up. He tells me that he is doing well overall.  He was seen in our office in December for risk assessment prior to left rotator cuff surgery. His Plavix was stopped at the end of last year. He did well with his shoulder surgery but unfortunately he fell on the ice and reinjured his shoulder. 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 His left chest wall has a hernia and he is thinking about seeing a surgeon for that procedure.   He has had no severe chest pain. He has been gardening. He does get easily fatigued with mild SOB if he walks up hills while hunting.  There are no symptoms that are similar to his complaints before his stents were placed.  No palpitations, near syncope, syncope, orthopnea or PND.     Current Medications (verified): 1)  Pravastatin Sodium 80 Mg Tabs (Pravastatin Sodium) .... Take One Tablet By Mouth Daily At Bedtime 2)  Carvedilol 12.5 Mg Tabs (Carvedilol) .Marland Kitchen.. 1 Tab Two Times A Day 3)  Klor-Con 10 10 Meq Cr-Tabs (Potassium Chloride) .... Take One Tablet Once Daily-Pt Has Been Taking Two Times A Day For The Last Week 4)  Isosorbide Mononitrate Cr 30 Mg Xr24h-Tab (Isosorbide Mononitrate) .Marland Kitchen.. 1 Tab Once Daily 5)  Lisinopril 40 Mg Tabs (Lisinopril) .Marland Kitchen.. 1 Tab Once Daily 6)  Hydrochlorothiazide 25 Mg Tabs (Hydrochlorothiazide) .Marland Kitchen.. 1 Tab Once Daily 7)  Aspirin 81 Mg Tabs (Aspirin) .... Once Daily 8)  Nitroglycerin 0.4 Mg  Subl (Nitroglycerin) .... One Tablet Under Tongue Every 5 Minutes As Needed For Chest Pain---May Repeat Times Three 9)  Oxycodone-Acetaminophen 10-325 Mg Tabs (Oxycodone-Acetaminophen) .Marland Kitchen.. 1 Tab Q4 Hours As Needed 10)  Doxycycline Hyclate 100 Mg Caps (Doxycycline Hyclate) .... Take 2 Capsules Daily  Allergies (verified): No Known Drug Allergies  Past History:  Past Medical History: Arthritis- CAD s/p prior PCI with stenting of the RCA as well as other stenting procedures in past Hypertension  Social History: Reviewed history from 07/20/2008 and no changes required. No tobacco No etoh No illicit drugs Married 3 children  Review of Systems       The patient complains of joint pain.  The patient denies fatigue, malaise, fever, weight gain/loss, vision loss, decreased hearing, hoarseness, chest pain, palpitations, shortness of breath, prolonged cough, wheezing, sleep apnea, coughing up blood, abdominal pain, blood in stool, nausea, vomiting, diarrhea, heartburn, incontinence, blood in urine, muscle weakness, leg swelling, rash, skin lesions, headache, fainting, dizziness, depression, anxiety, enlarged lymph nodes, easy bruising or bleeding, and environmental allergies.    Vital Signs:  Patient profile:   60 year old male Height:      72 inches Weight:      259 pounds BMI:     35.25 Pulse rate:   54 / minute Pulse rhythm:   regular BP sitting:   124 / 80  (left arm) Cuff size:  large  Vitals Entered By: Judithe Modest CMA (February 03, 2010 2:28 PM)  Physical Exam  General:  General: Well developed, well nourished, NAD Musculoskeletal: Muscle strength 5/5 all ext Psychiatric: Mood and affect normal Neck: No JVD, no carotid bruits, no thyromegaly, no lymphadenopathy. Lungs:Clear bilaterally, no wheezes, rhonci, crackles CV: RRR no murmurs, gallops rubs Abdomen: soft, NT, ND, BS present Extremities: Trace bilateral LE  edema, pulses 2+.    EKG  Procedure date:   02/03/2010  Findings:      Sinus bradycardia, rate 54 bpm.   Impression & Recommendations:  Problem # 1:  CAD, NATIVE VESSEL (ICD-414.01) Stable. No changes in medications. He is tolerating Pravastatin with occasional leg cramps at night. He used to be on Crestor but changed secondary to cost.  He is ok from a cardiac perspective for any upcoming hernia repair.   His updated medication list for this problem includes:    Carvedilol 12.5 Mg Tabs (Carvedilol) .Marland Kitchen... 1 tab two times a day    Isosorbide Mononitrate Cr 30 Mg Xr24h-tab (Isosorbide mononitrate) .Marland Kitchen... 1 tab once daily    Lisinopril 40 Mg Tabs (Lisinopril) .Marland Kitchen... 1 tab once daily    Aspirin 81 Mg Tabs (Aspirin) ..... Once daily    Nitroglycerin 0.4 Mg Subl (Nitroglycerin) ..... One tablet under tongue every 5 minutes as needed for chest pain---may repeat times three  Orders: EKG w/ Interpretation (93000)  Problem # 2:  ESSENTIAL HYPERTENSION, BENIGN (ICD-401.1) Well controlled.   His updated medication list for this problem includes:    Carvedilol 12.5 Mg Tabs (Carvedilol) .Marland Kitchen... 1 tab two times a day    Lisinopril 40 Mg Tabs (Lisinopril) .Marland Kitchen... 1 tab once daily    Hydrochlorothiazide 25 Mg Tabs (Hydrochlorothiazide) .Marland Kitchen... 1 tab once daily    Aspirin 81 Mg Tabs (Aspirin) ..... Once daily  Patient Instructions: 1)  Your physician recommends that you schedule a follow-up appointment in: 6 months 2)  Your physician recommends that you continue on your current medications as directed. Please refer to the Current Medication list given to you today.

## 2010-10-02 NOTE — Assessment & Plan Note (Signed)
Summary: PER CHECK OUT/SF  Medications Added CARVEDILOL 12.5 MG TABS (CARVEDILOL) 1 tab two times a day KLOR-CON 10 10 MEQ CR-TABS (POTASSIUM CHLORIDE) 1 tab once daily ISOSORBIDE MONONITRATE CR 30 MG XR24H-TAB (ISOSORBIDE MONONITRATE) 1 tab once daily LISINOPRIL 40 MG TABS (LISINOPRIL) 1 tab once daily HYDROCHLOROTHIAZIDE 25 MG TABS (HYDROCHLOROTHIAZIDE) 1 tab once daily      Allergies Added: NKDA  Visit Type:  6 mo f/u Primary Provider:  Finis Bud MD  CC:  chest pain.  History of Present Illness: 60 yo WM with history of CAD s/p two drug eluting stents to RCA 11/09, HTN, OA here today for routine follow up. He tells me that he is doing well overall.  He was seen in our office in December 2010 for risk assessment prior to left rotator cuff surgery. His Plavix was stopped at the end of 2010.  He describes some burning in his chest when he exerts himself. This is similar to pain prior to stents being placed. He also describes dyspnea. He hunted this season and took it more slowly.  No palpitations, near syncope, syncope, orthopnea or PND. His right foot and ankle is severely painful. He had a prior fall and has had fusion of the ankle. He tells me that he is not interested in any repeat catheterizations at this time.     Current Medications (verified): 1)  Pravastatin Sodium 80 Mg Tabs (Pravastatin Sodium) .... Take One Tablet By Mouth Daily At Bedtime 2)  Carvedilol 12.5 Mg Tabs (Carvedilol) .Marland Kitchen.. 1 Tab Two Times A Day 3)  Klor-Con 10 10 Meq Cr-Tabs (Potassium Chloride) .Marland Kitchen.. 1 Tab Once Daily 4)  Isosorbide Mononitrate Cr 30 Mg Xr24h-Tab (Isosorbide Mononitrate) .Marland Kitchen.. 1 Tab Once Daily 5)  Lisinopril 40 Mg Tabs (Lisinopril) .Marland Kitchen.. 1 Tab Once Daily 6)  Hydrochlorothiazide 25 Mg Tabs (Hydrochlorothiazide) .Marland Kitchen.. 1 Tab Once Daily 7)  Aspirin 81 Mg Tabs (Aspirin) .... Once Daily 8)  Nitroglycerin 0.4 Mg Subl (Nitroglycerin) .... One Tablet Under Tongue Every 5 Minutes As Needed For Chest  Pain---May Repeat Times Three 9)  Oxycodone-Acetaminophen 10-325 Mg Tabs (Oxycodone-Acetaminophen) .Marland Kitchen.. 1 Tab Q4 Hours As Needed 10)  Doxycycline Hyclate 100 Mg Caps (Doxycycline Hyclate) .... Take 2 Capsules Daily  Allergies (verified): No Known Drug Allergies  Past History:  Past Medical History: Reviewed history from 02/03/2010 and no changes required. Arthritis- CAD s/p prior PCI with stenting of the RCA as well as other stenting procedures in past Hypertension  Social History: Reviewed history from 07/20/2008 and no changes required. No tobacco No etoh No illicit drugs Married 3 children Disability  Review of Systems       The patient complains of chest pain and shortness of breath.  The patient denies fatigue, malaise, fever, weight gain/loss, vision loss, decreased hearing, hoarseness, palpitations, prolonged cough, wheezing, sleep apnea, coughing up blood, abdominal pain, blood in stool, nausea, vomiting, diarrhea, heartburn, incontinence, blood in urine, muscle weakness, joint pain, leg swelling, rash, skin lesions, headache, fainting, dizziness, depression, anxiety, enlarged lymph nodes, easy bruising or bleeding, and environmental allergies.    Vital Signs:  Patient profile:   60 year old male Height:      72 inches Weight:      270.50 pounds BMI:     36.82 Pulse rate:   62 / minute Pulse rhythm:   regular BP sitting:   122 / 78  (left arm) Cuff size:   large  Vitals Entered By: Danielle Rankin, CMA (September 04, 2010  1:45 PM)  Physical Exam  General:  General: Well developed, well nourished, NAD HEENT: OP clear, mucus membranes moist SKIN: warm, dry Neuro: No focal deficits Musculoskeletal: Muscle strength 5/5 all ext Psychiatric: Mood and affect normal Neck: No JVD, no carotid bruits, no thyromegaly, no lymphadenopathy. Lungs:Clear bilaterally, no wheezes, rhonci, crackles CV: RRR no murmurs, gallops rubs Abdomen: soft, NT, ND, BS present Extremities:  Right ankle with 1+ edema, left ankle with no edema, pulses 2+.    Impression & Recommendations:  Problem # 1:  CAD, NATIVE VESSEL (ICD-414.01) He has recurrent chest pain but he does not think it is severe enough to warrant repeat cath. I have told him that I will be glad to arrange a repeat cath if he is willing. He will call and let us know. Continue current meds.   His updated medication list for this problem includes:    Carvedilol 12.5 Mg Tabs (Carvedilol) .Marland Kitchen... 1 tab two times a day    Isosorbide Mononitrate Cr 30 Mg Xr24h-tab (Isosorbide mononitrate) .Marland Kitchen... 1 tab once daily    Lisinopril 40 Mg Tabs (Lisinopril) .Marland Kitchen... 1 tab once daily    Aspirin 81 Mg Tabs (Aspirin) ..... Once daily    Nitroglycerin 0.4 Mg Subl (Nitroglycerin) ..... One tablet under tongue every 5 minutes as needed for chest pain---may repeat times three  Problem # 2:  CAD, NATIVE VESSEL (ICD-414.01) BP stable. Needs repeat lipids next month.   His updated medication list for this problem includes:    Carvedilol 12.5 Mg Tabs (Carvedilol) .Marland Kitchen... 1 tab two times a day    Isosorbide Mononitrate Cr 30 Mg Xr24h-tab (Isosorbide mononitrate) .Marland Kitchen... 1 tab once daily    Lisinopril 40 Mg Tabs (Lisinopril) .Marland Kitchen... 1 tab once daily    Aspirin 81 Mg Tabs (Aspirin) ..... Once daily    Nitroglycerin 0.4 Mg Subl (Nitroglycerin) ..... One tablet under tongue every 5 minutes as needed for chest pain---may repeat times three  Problem # 3:  HYPERLIPIDEMIA-MIXED (ICD-272.4) Continue statin.   His updated medication list for this problem includes:    Pravastatin Sodium 80 Mg Tabs (Pravastatin sodium) .Marland Kitchen... Take one tablet by mouth daily at bedtime  Patient Instructions: 1)  Your physician recommends that you schedule a follow-up appointment in: 6 months. 2)  Your physician recommends that you continue on your current medications as directed. Please refer to the Current Medication list given to you today. Prescriptions: LISINOPRIL 40  MG TABS (LISINOPRIL) 1 tab once daily  #90 x 3   Entered by:   Danielle Rankin, CMA   Authorized by:   Verne Carrow, MD   Signed by:   Danielle Rankin, CMA on 09/04/2010   Method used:   Faxed to ...       [facility] (mail-order)             , [place]         Ph: [phone]       Fax: [phone]   RxID:   [id number] HYDROCHLOROTHIAZIDE 25 MG TABS (HYDROCHLOROTHIAZIDE) 1 tab once daily  #90 x 3   Entered by:   Danielle Rankin, CMA   Authorized by:   Verne Carrow, MD   Signed by:   Danielle Rankin, CMA on 09/04/2010   Method used:   Faxed to ...       [facility] (mail-order)             , [place]         Ph: [phone]  Fax: [phone]   RxID:   [id number] ISOSORBIDE MONONITRATE CR 30 MG XR24H-TAB (ISOSORBIDE MONONITRATE) 1 tab once daily  #90 x 3   Entered by:   Danielle Rankin, CMA   Authorized by:   Verne Carrow, MD   Signed by:   Danielle Rankin, CMA on 09/04/2010   Method used:   Faxed to ...       [facility] (mail-order)             , [place]         Ph: [phone]       Fax: [phone]   RxID:   [id number] KLOR-CON 10 10 MEQ CR-TABS (POTASSIUM CHLORIDE) 1 tab once daily  #90 x 3   Entered by:   Danielle Rankin, CMA   Authorized by:   Verne Carrow, MD   Signed by:   Danielle Rankin, CMA on 09/04/2010   Method used:   Faxed to ...       [facility] (mail-[facility])             , [place]         Ph: [phone]       Fax: [phone]   RxID:   [id number] CARVEDILOL 12.5 MG TABS (CARVEDILOL) 1 tab two times a day  #180 x 3   Entered by:   Danielle Rankin, CMA   Authorized by:   Verne Carrow, MD   Signed by:   Danielle Rankin, CMA on 09/04/2010   Method used:   Faxed to ...       [facility] (mail-[facility])             , [place]         Ph: [phone]       Fax: [phone]   RxID:   [id number]

## 2010-12-10 ENCOUNTER — Other Ambulatory Visit: Payer: Self-pay | Admitting: Cardiovascular Disease

## 2011-01-13 NOTE — Assessment & Plan Note (Signed)
[facility]                            CARDIOLOGY OFFICE NOTE   NAME:[name], [name]                        MRN:          [id number]  DATE:11/04/2007                            DOB:          08/20/1951    PRIMARY CARE PHYSICIAN:  Dr. Sheria Lang with [facility].   REASON FOR VISIT:  Cardiac followup.   HISTORY OF PRESENT ILLNESS:  [name] is doing well.  He underwent  cervical disk decompression surgery and states he has been feeling well.  He is also seeing a pain management physician and is on some new  medications listed below.  He is not having any angina and has stable  NYHA class II dyspnea on exertion.  He is tolerating his medical regimen  is back on Plavix.   ALLERGIES:  NO KNOWN DRUG ALLERGIES.   PRESENT MEDICATIONS:  1. Coreg 12.5 mg p.o. b.i.d.  2. Lisinopril 40 mg p.o. daily.  3. Imdur 30 mg p.o. daily.  4. Enteric-coated aspirin 325 mg p.o. daily.  5. Hydrochlorothiazide 25 mg p.o. daily.  6. Potassium 10 mg p.o. daily.  7. Sublingual nitroglycerin 0.4 mg p.r.n.  8. Plavix 75 mg p.o. daily.  9. Neurontin 100 mg p.o. t.i.d.  10.Voltaren 1% gel to the foot x3-4 daily.   REVIEW OF SYSTEMS:  As described in history of present illness,  otherwise negative.   EXAMINATION:  Blood pressure today is 124/83, heart rate is 73, weight  is 250 pounds which is overall stable.  The patient is comfortable and  in no acute distress.  NECK:  No elevated jugular venous pressure, no loud bruits.  LUNGS:  Clear without labored breathing.  CARDIAC:  Regular rate and rhythm; no loud murmur or gallop.  EXTREMITIES:  No significant edema.   IMPRESSION/RECOMMENDATIONS:  1. History of ischemic cardiomyopathy with ejection fraction of 45%      status post previous drug-eluting stent placement as well as bare      metal stent placement to the right coronary artery with repeat      angioplasty in October 2008.  The patient is doing well      symptomatically, and we will plan to continue his present medical      regimen.  I will see him back over the next 6      months.  2. Hypertension, well-controlled.     Jonelle Sidle, MD  Electronically Signed    [facility]  DD: 11/04/2007  DT: 11/05/2007  Job #: [id number]

## 2011-01-13 NOTE — Op Note (Signed)
NAME:  [name], [name]                 ACCOUNT NO.:  [id number]   MEDICAL RECORD NO.:  [id number]          PATIENT TYPE:  INP   LOCATION:  3027                         FACILITY:  [facility]   PHYSICIAN:  Cristi Loron, M.D.DATE OF BIRTH:  [birth year]   DATE OF PROCEDURE:  09/19/2007  DATE OF DISCHARGE:  09/20/2007                               OPERATIVE REPORT   BRIEF HISTORY:  The patient is a 60 year old white male who has suffered  from chronic neck pain with some numbness consistent with a cervical  myelopathy.  He was worked up with a cervical MRI which demonstrated the  patient had significant spinal stenosis at C3-4 and C5-6.  I discussed  the various treatment options with him including surgery.  The patient  has weighed the risks, benefits, and alternatives of surgery and decided  to proceed with a C3-4 and C5-6 anterior cervical discectomy fusion and  plating.   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   PROCEDURE:  C3-4 and C5-6 extensive anterior cervical  discectomy/decompression; C3-4 and C5-6 anterior interbody arthrodesis  with local morselized autograft bone and Actifuse bone graft extender;  insertion of C3-4 and C5-6 interbody prosthesis (Alphatec PEEK interbody  prosthesis; C3-4 and C5-6 anterior cervical plating with Codman Slim-Loc  titanium plate and screws.   SURGEON:  Cristi Loron, M.D.   ASSISTANT:  Hewitt Shorts, M.D.   ANESTHESIA:  General endotracheal.   ESTIMATED BLOOD LOSS:  100 mL.   SPECIMENS:  None.   DRAINS:  None.   COMPLICATIONS:  None.   DESCRIPTION OF PROCEDURE:  The patient was brought to the operating room  by the anesthesia team and general endotracheal anesthesia was induced.  The patient  remained in the supine position.  The anterior cervical  region was then shaved with clippers and prepared with Betadine scrub  and Betadine solution.  Sterile drapes were applied.  I then injected  the area to be incised with Marcaine with epinephrine solution.  I used  a scalpel to make a transverse incision in the patient's left anterior  neck.  I used Metzenbaum scissors to divide the platysma muscle and  dissected medial to sternocleidomastoid muscle, jugular vein, and  carotid artery.  I dissected down towards the anterior cervical spine,  carefully identifying the esophagus and retracting it medially.  We used  the Kitner swabs to clear the soft tissue from the anterior cervical  spine.  I then inserted a bent spinal needle into the upper exposed  intervertebral disc space.  I then obtained intraoperative radiograph to  confirm our location.   We then used electrocautery to detach the medial border of the longus  colli muscle bilaterally from C3-4 and C5-6 intervertebral disc space.  We inserted the Caspar self-retaining retractor for exposure, and we  began decompression at C5-6.  We attempted to incise the C5-6  intervertebral disc with a #15-blade scalpel.  The anterior disc  space  was quite spondylotic.  I then used a high-speed drill to remove some  ventral spondylosis from the disc space and then incised the  intervertebral disc and performed partial intervertebral discectomy  using the pituitary forceps and the Carlen curettes.  We then inserted  the distraction screws at C5-6 and distracted the intervertebral disc  space, and we then used a high-speed drill to decorticate the vertebral  endplates at C5-6 to drill away the remainder of C5-6 intervertebral  disc and to drill away some posterior spondylosis and thin out the  posterior longitudinal ligament.  We then incised the ligament with  arachnoid knife and removed the Kerrison punch undercutting the  vertebral end  plates at B1-4 decompressing the thecal sac.  We then  performed foraminotomies about the bilateral C6 nerve roots completing  the decompression at this level.   Having completed the decompression at C5-6, we then repeated this  procedure in an analogous fashion at C3-4 decompressing the thecal sac  and bilateral C4 nerve roots at this level as well.   Having completed the decompression, we now turned attention to  arthrodesis.  We used trial spacers and determined to use 5 mm medium  Alphatec interbody prosthesis at C5-6 and a 6-mm medium interbody  prosthesis at C3-4.  We prefilled the prosthesis with Actifuse bone  graft extender and local morselized autograft bone we obtained during  decompression and then inserted the prosthesis into the distracted  interspaces.  We removed the distraction screws.  There was a good snug  fit of the prosthesis at both levels.   Having completed the arthrodesis, we now turned attention to the  anterior instrumentation.  We obtained the appropriate length Codman  Slim-Loc anterior cervical plate and laid it along the anterior aspect  of the vertebral bodies at C5-6 and C3-4.  We then drilled two 14-mm  holes at C3, C4, C5, and C6.  We then secured the plate to the vertebral  bodies by placing two 14-mm screws at C5, C6, C3, and C4.  We obtained  intraoperative radiograph.  It demonstrated good position of the upper  plate and screws.  We had limited visualization of the lower plate and  screws, but looked good in vivo.  We therefore secured the screws and  plate by locking each cam.   We then obtained hemostasis using bipolar electrocautery and irrigated  the wound out with bacitracin solution.  We then removed the retractor  and then inspected the esophagus for any damage; there was none  apparent.  We then reapproximated the patient's platysma muscle with  interrupted 3-0 Vicryl suture, subcutaneous tissue with interrupted 3-0  Vicryl suture, and  the skin with Steri-Strips and Benzoin.  The wound  was then coated with bacitracin ointment.  A sterile dressing was  applied.  The drapes were removed, and the patient was subsequently  extubated by the anesthesia team and transported to the post-anesthesia  care unit in a stable condition.  All sponge, instrument, and needle  counts were correct at the end of this case.      Cristi Loron, M.D.  Electronically Signed     JDJ/MEDQ  D:  09/20/2007  T:  09/21/2007  Job:  [id number]

## 2011-01-13 NOTE — Assessment & Plan Note (Signed)
[facility]                             PULMONARY OFFICE NOTE   NAME:[name], [name] VILARDI                        MRN:          [id number]  DATE:06/14/2007                            DOB:          [birth year]    I had called [name] to discuss the results of his bronchoscopy.  This  was essentially unremarkable.  Specifically, it was negative for  malignancy or granulomatous disease.  His cultures have been negative to  date, although, his AFB and fungal cultures are still pending.  What I  have discussed with [name] is that we would likely need to have  radiographic follow up as well as monitoring his symptoms to determine  if any further interventions would be necessary.  During our  conversation, he complained of having pain in his left side again which  is getting progressively worse.  I have advised him to contact his  primary care physician, Dr. Sheria Lang, as he, depending upon his  evaluation, may need to have further imaging studies of his abdomen as  well as possibly surgical evaluation.     Coralyn Helling, MD  Electronically Signed    VS/MedQ  DD: 06/15/2007  DT: 06/16/2007  Job #: [id number]   cc:   [facility] Dr. Elwanda Brooklyn, MD  Cristi Loron, M.D.

## 2011-01-13 NOTE — Procedures (Signed)
NAME:  Palladino, Jakyren                 ACCOUNT NO.:  [id number]   MEDICAL RECORD NO.:  [id number]          PATIENT TYPE:  REC   LOCATION:  TPC                          FACILITY:  [facility]   PHYSICIAN:  Celene Kras, MD        DATE OF BIRTH:  17-May-1951   DATE OF PROCEDURE:  DATE OF DISCHARGE:                               OPERATIVE REPORT   HISTORY:  Karl comes to [facility].  We obtained  the history from 14-point review of systems.  He is known to me, he is  an individual who has had successful intercostal block for neuralgia,  secondary to surgical intrusion, most problematic segment is segment T9.  He also has some T8, but I am going to go ahead and proceed today with  interventional procedure, to attenuate  his symptoms, improve his  function, and quality of life.  He also wants to minimize escalation of  controlled substances, I have reviewed this with him.  Cautions were  given.  Opioid consent.  The patient care agreement reviewed.  No  barrier to communication.  Problematic is the neuralgia, that interferes  with his ability to flex, side bend, and he has some dysesthesia that is  also prominent.  1. I have reviewed the risk, complications, and options including      bleeding, infection, nerve damage, stroke, seizure, death, and      potential pneumothorax.  He understands and agrees.  I planned 4-mm      active tip to the left side, under local anesthetic, T9 considering      sequential T11 if necessary.  Consider sequential T8.  I do not      think that it would be necessary at this time and we will see how      he does in followup with Dr. Larna Daughters.  We will use pulse      technology.  He is consented for today's procedure.   Objectively, his typical pain, some dysesthetic pain, difficulty with  endurance and range of motion activities, side bending, pain with  extension and flexion.  Nothing new neurologically.  Extensive  osteoarthritic pain as well.   Nothing new neurological.   IMPRESSION:  Intercostal neuralgia.   PLAN:  T9 radiofrequency neural ablation, pulse technology, with local  anesthetic, at the intercostal nerve, with block.  He is consented.   PROCEDURE:  The patient taken to the operating suite and placed in the  prone position.  The back prepped and draped in the usual fashion, using  a 22-gauge spinal needle, RF 4 mm, I advanced to the intercostal nerve,  6-cm lateral, T9, and confirmed placement in multiple fluoroscopic  positions.  No evidence of intrusion into the pleura, and appropriately  stimulate.  After confirmation of placement, I then injected 2 mL of  Marcaine 0.5% MPF and 20 mg of Aristocort.  This is then followed with  pulsed lesion at 100 second cycling, at 40 degrees centimeter.   He tolerated this well.  We have had him go to the recovery and he  had  good diminution of pain perception with assessment in the context of  activities of daily living.  We will see him back in a few weeks to see  if we need to go to the sequential intercostal nerve.          ______________________________  Celene Kras, MD    HH/MEDQ  D:  02/21/2008 10:31:59  T:  02/22/2008 02:21:46  Job:  [id number]

## 2011-01-13 NOTE — Assessment & Plan Note (Signed)
[name]                            CARDIOLOGY OFFICE NOTE   NAME:[name], [name]                        MRN:          [id number]  DATE:07/12/2007                            DOB:          1951-02-11    PRIMARY CARE PHYSICIAN:  Dr. Sheria Lang with [facility].   REASON FOR VISIT:  Followup cardiac catheterization.   HISTORY OF PRESENT ILLNESS:  [name] was seen back in October for  preoperative assessment prior to planned C3-4 and C4-5 anterior cervical  decompression.  He was referred for a Myoview which was abnormal  demonstrating moderate lateral ischemia.  I arranged for him to undergo  a cardiac catheterization which revealed occlusion of 2 obtuse marginal  branches that filled by collaterals with more importantly a 95% focal  stenosis within the proximal edge of tandem overlapping stents in the  right coronary artery.  Dr. Juanda Chance intervened upon this with an  angioplasty (no new stent) and the patient tolerated this well.  Plan  based on that would be a month of Plavix and therefore deferring surgery  for approximately 6 weeks.   [name] comes into the clinic today stating that he has not had any  new chest pain.  He can not tell any marked difference in his prior  atypical pleuritic chest pain and has noted no new dyspnea on exertion.  His electrocardiogram today is normal showing sinus rhythm at 65 beats  per minute.  He remains off of Coumadin which we discontinued in October  and our plan is for a basic thrombophilia without, hopefully with no  further Coumadin anticipated.   ALLERGIES:  NO KNOWN DRUG ALLERGIES.   PRESENT MEDICATIONS:  1. Coreg 12.5 mg p.o. b.i.d.  2. Lisinopril 40 mg p.o. daily.  3. Imdur 30 mg p.o. daily.  4. Enteric coated aspirin 325 mg p.o. daily.  5. Hydrochlorothiazide 25 mg p.o. daily.  6. Potassium 10 mEq p.o. daily.  7. Nitroglycerin 0.4 mg sublingual p.r.n.  8. Oxycodone p.r.n.   REVIEW OF SYSTEMS:  As described in the history of present illness.  He  has had no bleeding problems.   EXAMINATION:  Weight is 245 pounds, blood pressure is 109/71, heart rate  is 66 and regular.  This is an obese male, no acute distress.  HEENT:  Conjunctivae and lids normal, oropharynx is clear.  NECK:  Supple, no elevated jugular venous pressure, no loud bruits, no  thyromegaly is noted.  LUNGS:  Clear without labored breathing with diminished breath sounds  and rhonchi, right greater than left; no wheezing noted.  CARDIAC:  Reveals a regular rate and rhythm, S3 gallop or pericardial  rub.  ABDOMEN:  Soft, nontender.  EXTREMITIES:  Show no significant pitting edema, distal pulses are 2+.  SKIN:  Warm and dry.  MUSCULOSKELETAL:  No kyphosis is noted.  NEUROPSYCHIATRIC:  Patient alert and oriented x3, affect is normal.   IMPRESSION AND RECOMMENDATION:  1. Coronary artery disease with ischemic cardiomyopathy, ejection      fraction approximately 45%.  Patient has undergone prior drug-  eluting stent placement as well as bare metal stent placement most      recently to the right coronary artery in 2006.  His recent Myoview      indicated lateral ischemia with ultimate angiographic findings      including 95% focal stenosis within the right coronary artery.      This was treated with angioplasty.  My plan at this point would be      to continue Plavix for 4 - 6 weeks in concert with aspirin.  He      could then discontinue Plavix with tentative plans for cervical      surgery in 6 weeks (mid-December).  I would like to see him back in      the office in 1 month prior to considering this.  2. Regarding his prior history of Coumadin use and pulmonary embolus      in the postoperative setting we will keep him off Coumadin as he      had an adequate treatment course as well as previous inferior vena      cava filter.  I spoke with Dr. Craige Cotta about this last month and I      will plan  a general thrombophilia workup with blood work, presuming      that this is normal, will not resume Coumadin.  3. Further plans to follow.     Jonelle Sidle, MD  Electronically Signed    SGM/MedQ  DD: 07/12/2007  DT: 07/13/2007  Job #: [id number]   cc:   Cristi Loron, M.D.  Coralyn Helling, MD

## 2011-01-13 NOTE — Assessment & Plan Note (Signed)
[name] returns today.  I last saw him on April 09, 2008.  He had an  intercostal RF per Dr. Stevphen Rochester on March 27, 2008, this still has been  helping, it is reducing the duration of his flare-ups due to the  intercostal neuralgias.  He continues to have pain that he describes as  sharp, stabbing, aching, and dull.  The ankle pain is more dull.  Intercostal pain is more sharp.  His sleep is fair.  He can sleep.  He  only has occasional disturbance of sleep by pain.  His main limitations  due to pain are lifting objects, and he could only lift very light  weights.  He no longer does any type of heavy work.  He was last  employed in August 2007.   His new complaint today is loss of sexual desire as well as erectile  dysfunction.  He has a history of coronary artery disease.  He has had a  couple of erections following his surgery, but really nothing  consistent.  He has also decreased desire.   He has been on long-term narcotic analgesics.  He has not had his  testosterone checked per his report.   PHYSICAL EXAMINATION:  GENERAL:  No acute distress.  Mood and affect  appropriate.  He has some tenderness to palpation over the left ribs  lower aspect.  Also, some pain in the right lower back area.  He has  limitation in range of motion in right ankle and some hypersensitivity  over the medial scar.   IMPRESSION:  1. Intercostal neuralgia, left side.  2. Lumbar postlaminectomy syndrome.  3. Ankylosis, right ankle.   PLAN:  1. We will continue his current medications, which include MS Contin      30 mg t.i.d., MSIR 15 mg a day, he took it 1 extra last month, he      ran out one day early, he was apologetic.  His Neurontin, we will      increase to q.i.d. 300 mg.  2. We asked him to follow up with his primary physician to check a      testosterone level, as this can be reduced with chronic narcotic      analgesics.  His Oswestry disability score today is 46%.   I will see him back in a  month to see how he does on the increased  Neurontin.  If he remains stable, we will switch him nursing visits on  alternate months.  We will ask the primary physician to check a  testosterone level.  I have discussed the possibility of Urology consult  as well.  He is less inclined to proceed at the current time.      Erick Colace, M.D.  Electronically Signed     AEK/MedQ  D:  05/08/2008 12:25:18  T:  05/09/2008 01:07:34  Job #:  [id number]   cc:   Derrick Ravel  Fax: [phone]

## 2011-01-13 NOTE — Assessment & Plan Note (Signed)
[facility]                            CARDIOLOGY OFFICE NOTE   NAME:[name], [name]                        MRN:          [id number]  DATE:08/06/2008                            DOB:          22-Jun-1951    PRIMARY CARE PHYSICIAN:  [name]. [name] Lang, MD   REASON FOR VISIT:  Cardiac followup after heart catheterization.   HISTORY OF PRESENT ILLNESS:  [name] is a pleasant 60 year old  Caucasian male with a past medical history significant for coronary  artery disease status post multiple percutaneous coronary interventions  with stent placements as well as hypertension and osteoarthritis, who  was previously followed in this office by Dr. Nona [name].  Mr.  [name] came in to see me for the first time on July 20, 2008, and was  complaining of shortness of breath with exertion as well as burning-type  chest sensation with exertion that felt similar to the episodes prior to  his last coronary intervention.  Based on his symptoms, I felt that it  was most appropriate to proceed directly to a left heart  catheterization.  This was performed on July 23, 2008.  The patient  was found to have a significant restenosis in the stented portion of the  right coronary artery.  There was a 99% in-stent restenosis in the mid  right coronary artery in the stented segment followed by a 70% in-stent  restenosis in the mid right coronary artery.  The non-stented segment of  the distal portion of the mid segment had a 70% stenosis.  I initially  performed a cutting balloon angioplasty of the in-stent restenosis, but  did not have a favorable result.  I elected to proceed with angioplasty  through both the stented and non-stented portion of the mid and distal  right coronary artery.  I then placed a 2.75- x 28-mm PROMUS drug-  eluting stent in the beginning of the distal right coronary artery  overlapping with an old stent.  I followed this by placing a 3.0- x 28-  mm PROMUS drug-eluting stent overlapping with the distal stent back into  the proximal portion of the mid segment.  A noncompliant balloon was  used for post-dilatation of the lesion.   The patient comes in today for followup and tells me that he is doing  well.  He has had complete resolution of his dyspnea on exertion as well  as his chest pain on exertion.  He denies any palpitations, diaphoresis,  nausea, vomiting, near syncope, syncope, orthopnea, PND, or lower  extremity edema.   PAST MEDICAL HISTORY:  Significant for coronary artery disease status  post prior stenting procedures, hypertension, and osteoarthritis.   CURRENT MEDICATIONS:  1. Crestor 10 mg once daily, (this was started following his      catheterization last week).  2. Carvedilol 12.5 mg twice daily.  3. Klor-Con 10 mEq once daily.  4. Plavix 75 mg once daily.  5. Isosorbide mononitrate 30 mg once daily.  6. Morphine sulfate 15 mg once daily.  7. Lisinopril 40 mg once daily.  8. Hydrochlorothiazide 25  mg once daily.  9. Gabapentin 400 mg 4 times daily.  10.Voltaren gel once daily.  11.Enteric-coated aspirin 325 mg once daily.  12.Morphine extended release 30 mg once daily.  13.AndroGel 1% 4 times daily.   REVIEW OF SYSTEMS:  As stated in the history present illness, is  otherwise negative.   PHYSICAL EXAMINATION:  VITALS:  Blood pressure 124/88, pulse 60 and  regular, respirations 12 and nonlabored.  GENERAL:  He is a pleasant middle-aged Caucasian male in no acute  distress.  He is alert and oriented x3.  PSYCHIATRIC:  Mood and affect are normal.  SKIN:  Warm and dry.  HEENT:  Normal.  NECK:  No JVD.  No carotid bruits.  No thyromegaly.  No lymphadenopathy.  LUNGS:  Clear to auscultation bilaterally without wheezes, rhonchi, or  crackles noted.  CARDIOVASCULAR:  Regular rate and rhythm without murmurs, gallops, or  rubs noted.  ABDOMEN:  Soft, nontender, and nondistended.  Bowel sounds are present.   EXTREMITIES:  No evidence of edema.  Pulses are 2+ in all extremities.  The right groin cath site is without hematoma or ecchymosis.   DIAGNOSTIC STUDIES:  1. Left heart catheterization performed on July 23, 2008, as      follows.  The left main coronary artery was free of any significant      disease.  The left anterior descending coronary artery gave rise to      3 diagonal branches and 2 septal perforators.  There were luminal      irregularities noted in the proximal and distal left anterior      descending coronary artery.  There was a 40-50% stenosis in the mid      left anterior descending coronary artery.  There was a 90% ostial      stenosis in the first diagonal branch, which is known to be old.      The circumflex is a codominant vessel that had 2 moderate-sized      marginal branches and 2 large posterolateral branches.  The 2      marginal branches are known to be totally occluded.  The second      marginal branch fills via left collaterals.  There was a 50%      stenosis in the midportion of the circumflex artery.  The right      coronary artery was a moderate-sized vessel that had stents noted      in the beginning of the mid segment down to the beginning of the      distal segment.  There was a 99% in-stent restenosis in the mid      right coronary artery.  This was followed by a 70% in-stent      restenosis in the mid right coronary artery.  The non-stented      segment at the distal portion of the mid segment had a 70%      stenosis.  There was a 40-50% stenosis noted in the distal stented      segment.  Left ventricular function was noted to be mildly      depressed with an ejection fraction of 40-45% and global      hypokinesis demonstrated.   ASSESSMENT AND PLAN:  This is a pleasant 60 year old Caucasian male who  was recently found to have recurrent in-stent restenosis in the stented  portion of the right coronary artery.  He is now status post placement  of 2  PROMUS drug-eluting stents in the  right coronary artery in segments  that were stented as well as a non-stented segment.  He is doing well at  the current time.  He denies any symptoms that are suggestive of angina,  arrhythmias, or congestive heart failure.  He seems to be tolerating the  Crestor that was started several weeks ago well.  I would like to have  him come back in 6-8 weeks to have liver function tests, a CK level, and  a fasting lipid profile drawn.  I would also like to see him back in the  office in 6 months unless he needs to be seen sooner.  We will continue  on all of his current medications as written.  I think that Mr. Caul  will be continued on aspirin and Plavix for the remainder of his life or  as long as he tolerates this medication regimen.     Verne Carrow, MD  Electronically Signed    CM/MedQ  DD: 08/06/2008  DT: 08/07/2008  Job #: [id number]   cc:   [name]. [name] Lang, MD

## 2011-01-13 NOTE — Procedures (Signed)
NAME:  [name], [name]                 ACCOUNT NO.:  [id number]   MEDICAL RECORD NO.:  [id number]          PATIENT TYPE:  REC   LOCATION:  [facility]                          FACILITY:  [facility]   PHYSICIAN:  Erick Colace, M.D.DATE OF BIRTH:  [birth year]   DATE OF PROCEDURE:  01/30/2008  DATE OF DISCHARGE:                               OPERATIVE REPORT   This is a left T9 and left T10 intercostal nerve block under  fluoroscopic guidance, performed at the posterior axillary line, area  marked, prepped with Betadine after marking the areas over the rib with  a radiopaque marker.  A 25-gauge 1-1/2-inch needle was inserted first to  numb the skin and subcutaneous tissue at a shallow depth.  Then, a 22-  gauge 1-1/2-inch spinal needle was inserted under fluoroscopic guidance,  starting the inferior aspect of the T9 rib.  Omnipaque 180 demonstrated  no intravascular uptake, then solution containing 1 mL of 4 mg/mL  dexamethasone and 1 mL of 2% MPF lidocaine were injected.  The same  procedure was repeated at the T10 level using same technique needle  injectate.  The patient tolerated the procedure well.  Pre- and post-  injection vitals were stable.  Post-injection instructions were given.  No shortness of breath noted post-injection.  I have also written a  prescription for lidocaine patch to be worn on that area, on q.a.m. and  off q.p.m.       Erick Colace, M.D.  Electronically Signed     AEK/MEDQ  D:  01/30/2008 10:49:16  T:  01/31/2008 00:12:10  Job:  [id number]

## 2011-01-13 NOTE — Assessment & Plan Note (Signed)
Mr. Talcott follows up.  He had an intercostal RF per Dr. Stevphen Rochester on March 27, 2008.  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  His average  pain is 6/10, was 7/10 last time I saw him, and was averaging about 9/10  last time I saw him.  Pain improves with rest and medication.  He can  walk 15-20 minutes at a time, climb steps, he drives.   His meds include Voltaren gel, MS Contin 30 mg b.i.d., MSIR 50 mg daily,  Neurontin 300 mg t.i.d., and Lidoderm patch.   PHYSICAL EXAMINATION:  No tenderness to palpation over the thoracic  area.  Gait shows no toe drag or knee instability.  He continues to have  contracture in right ankle secondary to fusion.   IMPRESSION:  Intercostal neuralgia, improved, status post  radiofrequency.  We will continue current medications.  He does have  several pain generators, several problems including posttraumatic  osteoarthritis, right ankle chronic postop pain.  He also has lumbar  post-laminectomy syndrome, status post lumbar fusion.   We will add Limbrel 250 mg b.i.d. for posttraumatic arthritis.      Erick Colace, M.D.  Electronically Signed     AEK/MedQ  D:  04/09/2008 18:11:26  T:  04/10/2008 07:28:17  Job #:  [id number]

## 2011-01-13 NOTE — Procedures (Signed)
NAME:  [name], [name]                 ACCOUNT NO.:  [id number]   MEDICAL RECORD NO.:  [id number]          PATIENT TYPE:  REC   LOCATION:  [facility]                          FACILITY:  [facility]   PHYSICIAN:  Erick Colace, M.D.DATE OF BIRTH:  04-23-1951   DATE OF PROCEDURE:  DATE OF DISCHARGE:                               OPERATIVE REPORT   Mr. Bulkley returns today.  He was scheduled for transforaminal lumbar  epidural steroid injection under fluoroscopic guidance.  However, he has  been taking Plavix up until about two days ago.   On follow-up on intercostal nerve block from April 13, he has had some  improvement in his left rib pain and when he does have attacks, they  last shorter and are less intense.  He continues to have benefit from  the right tibial nerve block.   VITAL SIGNS:  Blood pressure 122/68, pulse 66, respiratory rate 18,  oxygen saturation 99% on room air.  GENERAL:  In no acute distress.  Mood and affect appropriate.   His gait is normal.  Sensorium is clear.   We went over pre injection instructions.  We went over his medications  and have prescribed another month's supply.  Gabapentin 300 mg three  times daily.  Morphine IR 15 daily and morphine sulfate MS-Contin  controlled release 30 mg three times daily.  No signs of aberrant drug  behavior.  I will schedule him back once he has been on Plavix for a  minimum of 5 days.  He could take baby aspirin in the mean time.      Erick Colace, M.D.  Electronically Signed     AEK/MEDQ  D:  12/26/2007 09:48:21  T:  12/26/2007 10:17:36  Job:  [id number]

## 2011-01-13 NOTE — Assessment & Plan Note (Signed)
[facility]                            CARDIOLOGY OFFICE NOTE   NAME:[name], [name]                        MRN:          [id number]  DATE:01/31/2007                            DOB:          03-Jun-1951    PRIMARY CARE PHYSICIAN:  Dr. Sheria Lang, at [facility].   REASON FOR VISIT:  Cardiac followup.   HISTORY OF PRESENT ILLNESS:  Mr.  [name] comes in for a routine visit.  I  saw him back in March.  He has a history of coronary artery disease with  ischemic cardiomyopathy and mild to moderate left ventricular  dysfunction, being managed medically with occasional angina, relieved  with sublingual nitroglycerin.  He also has a history of postoperative  bilateral pulmonary emboli requiring initially an inferior vena cava  filter and Coumadin since late last year.  Symptomatically, he reports  no new problems.  He continues to have a mild pleuritic chest  discomfort, which he has had for months ever since his hospitalization  last year.  He does get short of breath with activity but no more than  NYHA class II.  He is having some problems with cervical disk disease  and has arm, neck and back pain associated with this.  He has been  seeing Dr. Tressie [name] and is being observed now with the  possibility of surgery in the future, although nothing planned  definitively.  Today I reviewed his medications and spoke with him about  his cardiac situation.  He has had no bleeding problems on Coumadin and  is followed through our Coumadin clinic.  He is interested in  discontinuing this medicine and he is around 6 months from his pulmonary  emboli, although these were fairly impressive, and I would tend to err  on the side of continuing the medication for now.   ALLERGIES:  NO KNOWN DRUG ALLERGIES.   PRESENT MEDICATIONS:  1. Coumadin as directed by the Coumadin clinic.  2. Coreg 12.5 mg p.o. b.i.d.  3. Lisinopril 40 mg p.o. daily.  4. Imdur 30  mg p.o. daily.  5. Prilosec 20 mg p.o. daily.  6. Hydrochlorothiazide 25 mg p.o. daily.  7. Potassium 10 mEq p.o. daily.  8. Percocet p.r.n.  9. Nitrostat p.r.n.   REVIEW OF SYSTEMS:  As described in the History of Present Illness.   EXAMINATION:  Blood pressure today is 130/82, heart rate is 80, weight  is 240 pounds, up from 219 in March.  The patient is comfortable, in no  acute distress.  NECK:  No elevated jugular venous pressure without bruits.  No  thyromegaly is noted.  LUNGS:  Clear without labored breathing.  CARDIAC EXAM:  A regular rate and rhythm, no S3 gallop or pericardial  rub.  EXTREMITIES:  No pitting edema.   IMPRESSION RECOMMENDATION:  1. Coronary artery disease with ischemic cardiomyopathy and mild to      moderate left ventricular dysfunction.  The patient is      symptomatically stable on the present medical regimen.  He is not      specifically  scheduled for cervical disk surgery at this point,      although I would anticipate he would need a preoperative Myoview      prior to proceeding if this does eventually get planned.  I will      plan to see him back over the next 3 months.  2. Hypertension, better controlled today.  3. History of bilateral pulmonary emboli status post inferior vena      cava filter and being followed in the Coumadin clinic.  He has not      had any major bleeding problems.  I am planning to continue the      Coumadin for now.     [name] Sidle, MD  Electronically Signed    SGM/MedQ  DD: 01/31/2007  DT: 01/31/2007  Job #: [id number]   cc:   [name], M.D.

## 2011-01-13 NOTE — Assessment & Plan Note (Signed)
A 60 year old male with chronic postoperative pain, right ankle status  post fusion who has responded well to posterior tibial nerve block.  His  main complaint today is lower extremity radicular discomfort related to  lumbar post laminectomy syndrome.  He has had some partial relief with  his morphine sulfate.  He has had relief in the past with epidural  steroid injections, but has not had one for about a year.  He is  planning to follow up with Dr. Diona Browner.  Next week, the patient would  like to get off the Plavix if possible.  Certainly, I would be happy if  he just can come off the medication for 5 days prior to the procedure.   The patient has no new medical problems in interval time.   His average pain is about 7/10, interferes with activity at 7/10 level,  worse during the daytime and evening hours, worse with walking, bending,  standing, some other activities.  Improves with medications and  injections.  Relief from meds is fair.  He can walk 15 minutes at a  time.  He can climb steps.  He can drive.  He has some trouble with  walking, confusion on his review of systems, shortness of breath as  well.  No chest pains.   His blood pressure is 123/81, pulse 54, respirations 18, O2 sat 97% on  room air.   His medications have been reviewed.  He remains on lisinopril, Plavix,  carvedilol, isosorbide dinitrate, Klor-Con, hydrochlorothiazide,  Crestor.  His medicines from our clinic include MS Contin 30 mg t.i.d.  and MSIR 15 mg p.o. b.i.d.  He is on gabapentin 400 mg q.i.d. as well.   Exam, no acute stress.  Mood and affect appropriate.  His back has no  tenderness to palpation in the upper lumbar area; however, in the lower  lumbosacral area, he has pain all the way down to around S2 PSIS area.  His lower extremities have no evidence of significant edema.  No knee  effusion.  He has right ankle fusion and has very limited range of  motion.  His gait is limited by fusion in his  right ankle.  His knees  showed no evidence of instability despite his TKRs bilaterally.   Deep tendon reflexes are 1+ bilateral knees and 0 at the ankles.   IMPRESSION:  1. Lumbar post laminectomy syndrome, postoperative pain, radiculitis,      lumbosacral area.  2. Chronic postoperative pain, right ankle.  They are relieved by      phenol nerve block.   PLAN:  1. We will set him up for a caudal epidural steroid injection.  2. Continue his morphine sulfate and his gabapentin as noted above.      Erick Colace, M.D.  Electronically Signed     AEK/MedQ  D:  02/05/2009 15:41:12  T:  02/06/2009 06:40:10  Job #:  [id number]   cc:   Jonelle Sidle, MD  [address]  [place], [place] [address]

## 2011-01-13 NOTE — Discharge Summary (Signed)
NAME:  [name], [name]                 ACCOUNT NO.:  [id number]   MEDICAL RECORD NO.:  [id number]          PATIENT TYPE:  INP   LOCATION:  6529                         FACILITY:  [facility]   PHYSICIAN:  Verne Carrow, MDDATE OF BIRTH:  Feb 15, 1951   DATE OF ADMISSION:  07/23/2008  DATE OF DISCHARGE:  07/24/2008                               DISCHARGE SUMMARY   PRIMARY CARDIOLOGIST:  Verne Carrow, MD.   PRIMARY CARE PHYSICIAN:  Beulah Gandy. Sheria Lang, MD   DISCHARGE DIAGNOSIS:  Obstructive coronary artery disease status post  placement of drug-eluting stent in the right coronary artery.   SECONDARY DIAGNOSES:  1. Hypertension.  2. Osteoarthritis.   ALLERGIES:  The patient has no known drug allergies.   PROCEDURES PERFORMED DURING HOSPITALIZATION:  The patient had a left  heart catheterization, left ventriculogram, a PTCA, and stent in the  right coronary artery, and an Angio-Seal of the right femoral artery  performed successfully on July 23, 2008.  See Dr. Cristal Deer  McAlhany's procedure report for more detail regarding the left heart  catheterization.  The patient had mid stented segments 99% stenosis in  the right coronary artery and distal segments had 50% in-stent stenosis  in the left coronary artery.  The left ventricular ejection fraction was  estimated at 40% and global hypokinesis was also seen.  The patient had  an ECG performed on July 23, 2008, which showed sinus bradycardia at  a rate of 49 beats per minute and T-wave flattening in lead 2 and aVF  and T-wave inversion in lead 3.  The patient had another EKG performed  on July 24, 2008, which showed a normal sinus rhythm with a rate of  68.  T-wave flattening in lead 2, T-wave inversion in lead 3, and also T-  flattening in aVF.   HISTORY OF PRESENT ILLNESS:  On July 20, 2008, the patient was seen  in the clinic by Dr. Clifton James and reported experiencing exertional chest  pain and exertional  shortness of breath.  It was planned at that time  due to the patient's history of coronary artery disease status post drug-  eluting stent and bare-metal stent in the right coronary artery that the  patient should be planned for left heart catheterization to diagnose the  origin of his restenosis on July 23, 2008.  The patient agreed and  was admitted on July 23, 2008.   HOSPITAL COURSE:  The patient was admitted on July 23, 2008, in  stable condition.  He underwent a successful left heart catheterization  and right coronary artery drug-eluting stent placement.  The patient  tolerated the procedure well and was discharged on July 24, 2008,  with a follow up visit with Dr. Verne Carrow on August 06, 2008.  Vital signs on day of discharge were stable with a systolic blood  pressure of 99 and diastolic blood pressure of 49.  Temperature 98.0  degrees Fahrenheit, pulse was 57, respirations 18, and O2 saturation of  94% on room air.  His weight is stable at 117.48 kg.   LABORATORY DATA:  White blood  cell 7.1, hemoglobin 12.7, hematocrit  37.5, and platelet count 163.  Sodium 138, potassium 4.0, chloride 105,  CO2 of 27, BUN 9, creatinine 0.80, and glucose 99.   FOLLOWUP:  The patient has a follow up visit with Dr. Verne Carrow on August 06, 2008, at 1:30 p.m.   DISCHARGE MEDICATIONS:  1. Carvedilol 12.5 mg twice daily.  2. Klor-Con 10 mEq daily.  3. Plavix 75 mg daily.  4. Isosorbide 30 mg daily.  5. Morphine 15 mg IR daily if needed.  6. Lisinopril 40 mg daily.  7. HCTZ 25 mg daily.  8. Gabapentin 400 mg 4 times a day.  9. Voltaren gel daily.  10.Enteric-coated aspirin 325 mg daily.  11.Morphine CR 30 mg 3 times a day.  12.Lidoderm patch 5% apply in the evening and in the day.  13.Nitroglycerin tablets 0.4 mg as needed.  14.AndroGel 1% 4 times daily.  15.Crestor 10 mg by mouth daily.   There are no outstanding labs or studies.   DURATION OF  DISCHARGE/ENCOUNTER:  45 minutes including physician time.      Jarrett Ables, [facility]      Verne Carrow, MD  Electronically Signed    MS/MEDQ  D:  07/24/2008  T:  07/25/2008  Job:  [id number]   cc:   Beulah Gandy. Sheria Lang, M.D.

## 2011-01-13 NOTE — Procedures (Signed)
NAME:  [name], [name]                 ACCOUNT NO.:  [id number]   MEDICAL RECORD NO.:  [id number]           PATIENT TYPE:   LOCATION:                                 FACILITY:   PHYSICIAN:  Erick Colace, M.D.DATE OF BIRTH:  07-13-1951   DATE OF PROCEDURE:  DATE OF DISCHARGE:                               OPERATIVE REPORT   PROCEDURE:  Right tibial nerve neurolysis.   INDICATION:  Chronic postoperative pain, right ankle status post ankle  fusion.  Pain is only partially responsive to narcotic analgesic  medications and other conservative care.  He has had previous good  relief for about 6 months after a right tibial nerve block.   Informed consent was obtained after describing the risks and benefits of  the procedure with the patient.  These include bleeding, bruising, and  infection.  He elects to proceed and has given written consent.  The  patient was placed prone on exam table.  External DC stim applied to the  popliteal fossa using EMG stimulator.  Plantar flexion twitch obtained.  The area marked and prepped with Betadine, entered with 40-mm needle  electrode.  Twitch obtained and confirmed initially at 1.4 mA and 4 mL  of 1% phenol injected.  Another area after further needle of the  movement was identified and confirmed at 0.7 mA and an additional 1 mL  of phenol were injected into this site.   The patient tolerated the procedure well.  Post injection instructions  were given.      Erick Colace, M.D.  Electronically Signed     AEK/MEDQ  D:  08/02/2008 17:01:31  T:  08/03/2008 05:05:52  Job:  [id number]

## 2011-01-13 NOTE — Assessment & Plan Note (Signed)
[name]                             PULMONARY OFFICE NOTE   NAME:[name], [name] DETTMER                        MRN:          [id number]  DATE:06/03/2007                            DOB:          1950-11-18    REFERRING PHYSICIAN:  Dr. Sheria [name]   I met [name] today for evaluation of his abnormal CT scan of the  chest.   He had a fairly complicated hospital course approximately one year ago,  in which he underwent thoracic spinal surgery with Dr. Lovell [name].  This  was complicated by a pneumothorax and hemothorax, and he was evaluated  by Dr. Edwyna [name].  He also had a pulmonary embolism and was initially  treated with IVC filter, but then was later started on long-term  anticoagulation with warfarin.  He says that his breathing has been  getting progressively worse since then, although it seems like he has  probably been having some problems with his breathing, even before this.  He says that now he gets dyspneic after walking just 200 yards.  He also  complains of pleuritic type chest pain when he takes a deep breath, more  prominent retrosternally, but then also on his left side.  He has an  occasional cough, but denies any significant sputum production.  He also  denies any hemoptysis.  He has not had any problems with fevers, sweats,  chills or weight-loss.  He denies any allergy symptoms.  He has not had  any problems with skin rashes, joint pain or joint swelling, besides his  neck and his back.  He has not had any recent sick exposures.  He denies  any travel history.  He has not had any significant exposure to animals  or birds.  He used to work in a [employer], from [place] to 1983.  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  He  has a very remote history of smoking.  He says that his mother did have  tuberculosis previously and he was tested for TB at that time and, from  what he can remember, this was negative.   PAST MEDICAL HISTORY:  Significant for hypertension, elevated  cholesterol, pulmonary embolism, chronic neck pain, back surgery,  coronary artery disease, status post angioplasty, bilateral knee  replacements, rotator cuff repair, multiple foot surgeries, carpal  tunnel syndrome, status post repair; MRSA infection, and he also has a  chronic foot infection.   CURRENT MEDICATIONS:  1. Coumadin as directed.  2. Coreg 12.5 mg b.i.d.  3. Lisinopril 40 mg daily.  4. Imdur 30 mg daily.  5. Aspirin 325 mg daily.  6. Doxycycline 100 mg b.i.d.  7. Prilosec 20 mg p.r.n.  8. Hydrochlorothiazide 25 mg daily.  9. Potassium 10 mEq daily.  10.Oxycodone as needed.  11.Nitrostat as needed.   ALLERGIES:  He has no known drug allergies.   SOCIAL HISTORY:  He is married.  He is currently on disability.  He quit  smoking 30 years ago and used to smoke a pack of cigarettes a day for  three years.  He has no significant history of alcohol or drug use.   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  His mother had coronary artery disease and  hypertension.   REVIEW OF SYSTEMS:  Unremarkable, except as stated above.   PHYSICAL EXAM:  He is 6 feet tall, 241 pounds.  Temperature is 98, blood  pressure is 126/84, heart rate 70, oxygen saturation 97% on room air.  HEENT:  Pupils reactive.  There is no sinus tenderness, no nasal  discharge.  He has mild erythema of the posterior pharynx.  He wears  dentures.  There is no lymphadenopathy, no thyromegaly.  HEART:  S1, S2.  CHEST:  There was no wheezing or rales.  ABDOMEN:  Soft, nontender.  He has a reducible hernia on his left side.  There is no organomegaly.  EXTREMITIES:  There is no edema, cyanosis or clubbing.  He has multiple  surgical scars.  He also has multiple small bruises.  NEUROLOGIC EXAM:  No focal deficits were  appreciated.   The CT scan of his chest from September 11 shows small subcarinal  lymphadenopathy, as well as hilar adenopathy.  There were diffusely  increased interstitial markings and there were multiple acinar-like and  central lobular nodules, most notable in the right upper lobe, but also  seen diffusely, and there were nonspecific ground-glass opacities in the  lower lobes, as well as subsegmental atelectasis.   IMPRESSION:  1. That of progressive dyspnea with abnormal CT scan findings, as      stated above.  There are several components which could be included      in the differential for this.  My suspicion is that this most      likely represents an inflammatory process, such as sarcoidosis or      hypersensitivity pneumonitis.  It seems less likely that this would      be related to his previous history of working in coal mines.  It is      possible it could be related to an ongoing fungal infection or      mycobacterial infection, although this seems less likely, given his      lack of other symptoms.  It also seems less likely that this would      be related to underlying malignancy.  What I will do is arrange for      him to undergo a PPD test.  I have advised him to hold off on      taking his Coumadin and aspirin for the time-being and I will      schedule him for bronchoscopy.  I reviewed the procedure for the      bronchoscopy.  I discussed the possible risks related to      bronchoscopy, including bleeding, pneumothorax, infection and      nondiagnosis.  I have advised him to not eat or drink anything      after midnight on the day before the study and that he would need      to have somebody who could drive him to and from the hospital.  I      will also have him undergo laboratory tests to check his CBC,      electrolytes, liver function tests, renal function tests, ACE      level, ANA, rheumatoid factor and erythrocyte sedimentation rate.  I will then follow  up with him in the office shortly after the      results of his bronchoscopy biopsies are reviewed.  2. Pulmonary embolism:  He has been on anticoagulation for      approximately one year.  From what I can tell, he has only had one      episode of pulmonary embolism and it appears that this may be      related to his previous surgery.  I would defer this to his primary      care physician and cardiology, as far as how long he would need to      be on Coumadin for it, although it may be possible that he could      have this discontinued if no other obvious risk factors for him to      have recurrent thromboembolic disease.     Coralyn Helling, MD  Electronically Signed    VS/MedQ  DD: 06/03/2007  DT: 06/03/2007  Job #: [id number]   cc:   [facility] Dr. Elwanda Brooklyn, MD  Cristi Loron, M.D.

## 2011-01-13 NOTE — Discharge Summary (Signed)
NAME:  [name], [name]                 ACCOUNT NO.:  [id number]   MEDICAL RECORD NO.:  [id number]          PATIENT TYPE:  OIB   LOCATION:  6524                         FACILITY:  [facility]   PHYSICIAN:  Bruce R. Juanda Chance, MD, FACCDATE OF BIRTH:  April 09, 1951   DATE OF ADMISSION:  06/27/2007  DATE OF DISCHARGE:                         DISCHARGE SUMMARY - REFERRING   DISCHARGE DIAGNOSIS:  1. Atypical chest discomfort associated with an abnormal adenosine      Myoview.  2. Progressive coronary artery disease.  3. Status post percutaneous transluminal coronary angioplasty of the      in-stent restenosis in the mid right carotid artery.  4. Hyperlipidemia history as noted below.   PROCEDURES PERFORMED:  Cardiac catheterization with PTCA of in-stent mid  RCA restenosis by Dr. Juanda Chance on June 27, 2007.   BRIEF HISTORY:  [name] is a 60 year old male who was referred to Dr.  Diona Browner on June 22, 2007, for preoperative clearance in regards to  anterior cervical decompression.  He was also undergoing pulmonary  evaluation by Dr. Craige Cotta for abnormal CT.  Dr. Diona Browner noted the patient  complaining of fairly pleuritic sharp left-sided chest discomfort and  exertional chest tightness associated with shortness of breath.  Thus,  he had ordered an adenosine Myoview which showed EF of 45% and lateral  wall ischemia.  Thus, after discussing pulmonary status with Dr. Craige Cotta,  prior history of bilateral pulmonary emboli, and his history of coronary  intervention, it was felt that he should undergo cardiac catheterization  prior to any surgery.   LABORATORY DATA:  Chest x-ray on June 27, 2007, showed no acute  findings.  EKG showed normal sinus rhythm, normal axis, nonspecific ST/T-  wave changes.  Prior to discharge on the June 28, 2007, H&H was 11.3  and 32.8, normal indices, platelets 169,000, WBC 6.8, sodium 139,  potassium 4.0.  BUN 10, creatinine 0.79, glucose 89.  CK-MB  postprocedure was 51  and 2.2.  Fasting lipids showed a total cholesterol  188, triglycerides 272, HDL 31, LDL 103.   HOSPITAL COURSE:  [name] was brought in for procedure by Dr. Juanda Chance.  This was performed on June 27, 2007.  Catheterization showed a total  OM-1 and total OM-2, a 95% in-stent restenosis in the mid RCA, global  hypokinesis.  Dr. Juanda Chance, after review, performed PTCA to the in-stent  mid RCA restenosis, reducing this to 20%.  Post sheath removal and  bedrest, he was ambulating without difficulty.  Catheterization site was  intact.  Plavix was recommended for one month since it was a PTCA only.  It was felt that he should resume his aspirin and Coumadin and delay his  neck surgery for six weeks.  Dr. Juanda Chance felt that Dr. Diona Browner or Dr.  Craige Cotta could address long-term Coumadin given his remote history of  bilateral pulmonary emboli.   DISPOSITION:  The patient is discharged home.  Wound care and activities  are per supplemental sheet.  New medications include Plavix 75 mg every  day for one month.  He is advised to resume his Coumadin as previously,  Coreg 12.5 mg twice a day, lisinopril 40 mg daily, Imdur 30 mg daily,  aspirin 325 mg daily, hydrochlorothiazide 25 mg daily, potassium 10 mEq  daily, nitroglycerin 0.4 as needed, Percocet as needed.  He will have a  PT/INR on Tuesday, July 05, 2007, at 9:30.  He was asked to bring all  medications to all appointments.  He will see Dr.  Diona Browner on July 12, 2007, at 3:15 p.m.Marland Kitchen  Based on the chart, it is  not clear why he is not on a Statin given his coronary artery disease.  Dr. Diona Browner should review and consider this at the time of follow-up.   DISCHARGE TIME:  40 minutes.      Joellyn Rued, PA-C      Bruce R. Juanda Chance, MD, [facility]  Electronically Signed    EW/MEDQ  D:  06/28/2007  T:  06/28/2007  Job:  [id number]   cc:   Jonelle Sidle, MD  Cristi Loron, M.D.  Coralyn Helling, MD  Dr. Sheria Lang

## 2011-01-13 NOTE — Discharge Summary (Signed)
NAME:  [name], [name]                 ACCOUNT NO.:  [id number]   MEDICAL RECORD NO.:  [id number]           PATIENT TYPE:   LOCATION:                                 FACILITY:   PHYSICIAN:  Cristi Loron, M.D.    DATE OF BIRTH:   DATE OF ADMISSION:  06/12/2006  DATE OF DISCHARGE:  07/14/2006                               DISCHARGE SUMMARY   BRIEF HISTORY:  The patient is a 60 year old white male who has suffered  from severe back and bilateral leg pain, numbness, tingling, weakness.  He was worked up with a lumbar MRI, which demonstrated a large herniated  disk at T11-12 with severe spinal cord compression.  I discussed the  various treatment options with the patient, including surgery.  The  patient has weighed the risks, benefits and alternatives to surgery and  decided to proceed with a thoracotomy for a T11 corpectomy, fusion and  instrumentation from T10 down to T12.   For further details of this admission, please refer to typed history and  physical.   HOSPITAL COURSE:  I admitted the patient to [facility] on  June 12, 2006.  On June 14, 2006, I performed a T11 corpectomy,  fusion, plating from T10-T12.  Dr. Edwyna Shell performed exposure.  The  surgery went well.  (For full details of the operations, please refer to  typed operative note.)   POSTOPERATIVE COURSE:  The patient's postoperative course was as  follows:  He was digitally monitored in the ICU.  Dr. Edwyna Shell managed his  chest tubes.  The patient had a short run of V-tach, and Dr. Graciela Husbands, his  cardiologist, saw the patient.  The patient was transferred to the  neurosurgical general floor on June 18, 2006.  Cardiology Team signed  off and recommended he follow up with his primary cardiologist in  [place].  On June 21, 2006, the patient became quite short of  breath.  An x-ray showed a new, large left pleural effusion.  Dr. Edwyna Shell  was on vacation, so Dr. Donata Clay managed this and placed a left  chest  tube on June 21, 2006.  The patient did become anemic and was  transfused.  The patient was noted to have a leukocytosis to 35,000.  Initially, there was no clear cause for this.  The patient had some  suicidal ideation during his hospitalization, and we had a psychiatrist  see the patient.  He was seen by Dr. Jeanie Sewer.  The patient developed a  boil on his right ankle, which had a prior history of a MRSA  infection.  We had Orthopedic Surgery see the patient, and they  recommended medical management, i.e., IV antibiotics, and recommended he  follow up with his orthopedic surgeon who had done his prior surgery in  [place].  We had Infectious Disease see the patient.  He was seen by  Dr. Cliffton Asters, and he recommended continued IV vancomycin.  The  patient again became short of breath.  We had Pulmonary/Critical Care  Medicine see the patient.  The patient was worked up with CT  scan, which  demonstrated possible pulmonary embolisms.  It was thought at that point  that benefits of anticoagulation outweighed the risk.  He was started on  heparin.  The patient was transferred back to the ICU.  It was later  decided that the patient should have an IVC filter placed, which was  done on June 30, 2006.  The patient had some more chest pain, but  again, this was attributed to his pulmonary embolisms.  He continued to  be followed by Cardiology.  The patient underwent a left thoracotomy,  evacuation of hemothorax, and decortication by Dr. Edwyna Shell on July 02, 2006.  The cultures from this surgery demonstrated methicillin-sensitive  Staph aureus.  At this point, Dr. Orvan Falconer recommended Ancef rather than  vancomycin.  The patient had a syncopal event on July 03, 2006.  Seven cultures demonstrated Gram-negative rods seen by Gram stains from  a left chest decortication specimens, so Dr. Orvan Falconer changed his IV  antibiotics to Zosyn and added rifampin.  Remainder of the  patient's  hospital course was relatively unremarkable.  We did have the Rehab Team  see the patient.  They did not think he needed rehab, and by July 14, 2006, the patient was afebrile, his vitals were stable, his wounds  were healing well, his chest tubes were out, and he was requesting  discharge to home.   The patient was discharged to home on July 15, 2007.  Instructed to  follow up with me in a few weeks.  Instructed to follow up with Dr.  Edwyna Shell and Dr. Orvan Falconer as per their directions.  He was also instructed  to follow up with Dr. Bevely Palmer, his orthopedic surgeon in [place], and  his cardiologist in [place].  He was set up for a home course of Cipro  and rifampin for 2-3 months per the Infectious Disease Team.   DISCHARGE INSTRUCTIONS:  The patient was given written discharge  instructions and the above followup instructions.   FINAL DIAGNOSES:  1. 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  2. Thoracic myelopathy.  3. Thoracic stenosis.  4. 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  5. Right ankle infection with sensitive Staphylococcus aureus, as      well.  6. Pulmonary embolisms.  7. Thrush.  8. Pleural effusion.  9. Respiratory failure.  10.Hypertension.  11.Coronary artery disease.  12.Hyperlipidemia.   PROCEDURES PERFORMED:  1. Thoracotomy for T11 corpectomy with fusion and instrumentation from      T10-T12 with the titanium interbody prosthesis and ANTARES titanium      screws and rods.  2. Placement of chest tubes.  3. Thoracotomy for decortication and removal of hematoma.  4. Placement of inferior vena cava filter.      Cristi Loron, M.D.  Electronically Signed     JDJ/MEDQ  D:  01/31/2007  T:  01/31/2007  Job:  [id number]   cc:   Cliffton Asters, M.D.

## 2011-01-13 NOTE — Assessment & Plan Note (Signed)
[facility]                            CARDIOLOGY OFFICE NOTE   NAME:Stoermer, DOCTOR SHEAHAN                        MRN:          [id number]  DATE:08/08/2007                            DOB:          09-23-1950    PRIMARY CARE PHYSICIAN:  Dr. Sheria Lang with [facility].   REASON FOR VISIT:  Preoperative evaluation.   HISTORY OF PRESENT ILLNESS:  I saw Mr. Weckerly back in November.  He has  been pending planned C3-4, C4-5 anterior cervical decompression surgery.  He did undergo a cardiac catheterization revealing occlusion of two  obtuse marginal branches that filled by collaterals as well as a 95%  focal stenosis within the proximal edge of tandem overlapping stents in  the right coronary artery.  This was intervened upon by Dr. Juanda Chance with  angioplasty (no new stent placement) and he did well.  Date of the  procedure was June 27, 2007.  He has been on Plavix since that time  and we requested surgery be deferred at least for 4-6 weeks after his  intervention.  He does not report any problems with angina at this time,  and he has been compliant with his medications.  Otherwise, I spoke with  Dr.  Craige Cotta in the meanwhile about the patient's previous history of  pulmonary embolism in the postoperative setting status post inferior  vena cava placement.  Mr.  Mosely's thrombophilia screen was overall  reassuring, arguing against long-term Coumadin.  I am not entirely clear  if he has a retrievable inferior vena cava filter, although, at this  time I would leave this in place, particularly as repeat surgery is  planned.   ALLERGIES:  NO KNOWN DRUG ALLERGIES.   PRESENT MEDICATION:  1. Coreg 12.5 mg p.o. b.i.d.  2. Lisinopril 40 mg p.o. daily.  3. Imdur 30 mg p.o. daily.  4. Enteric coated aspirin 325 mg p.o. daily.  5. Doxycycline 100 mg p.o. b.i.d.  6. Hydrochlorothiazide 25 mg p.o. daily.  7. Potassium 10 mEq p.o. daily.  8. Plavix 75 mg p.o.  daily.   REVIEW OF SYSTEMS:  As described in history of present illness.   PHYSICAL EXAMINATION:  VITAL SIGNS:  Blood pressure 130/81, heart rate  70, weight 248 pounds which is stable.  GENERAL:  The patient is comfortable, in no acute distress.  NECK:  No elevated jugular venous pressure, without bruits.  LUNGS:  General clear without labored breathing.  CARDIAC:  Regular rate and rhythm.  No S3 or gallops.  EXTREMITIES:  No significant pitting edema.   IMPRESSION/RECOMMENDATIONS:  1. Coronary disease with ischemic cardiomyopathy, ejection fraction      45%, status post previous drug eluting stent placement as well as      bare metal stent placement to the right coronary artery.  Most      recent intervention was angioplasty of the right coronary artery in      late October.  The patient is symptomatically stable at this time.      He should be able to proceed with his planned surgery and  Plavix      can be discontinued.  I would like to see him back over the next      few months postoperatively as well.  2. Prior history of postoperative pulmonary embolus.  The patient has      inferior vena cava and filter in place and is no longer on Coumadin      having completed a full treatment course.  He did not have a      thrombophilia propensity based on follow up blood work.  3. Further plans to follow.     Jonelle Sidle, MD  Electronically Signed    SGM/MedQ  DD: 08/08/2007  DT: 08/09/2007  Job #: [id number]   cc:   [facility] Dr. Janell Quiet, M.D.  Coralyn Helling, MD

## 2011-01-13 NOTE — Assessment & Plan Note (Signed)
[name] returns today.  I last saw him on May 08, 2008.  He had  intercostal RF per Dr. Stevphen Rochester on March 27, 2008.  This still has been  helping.  It has reduced the duration of his flareups due to the  intercostal neuralgia.  He appears to have pain that is described as  sharp, dull, and stabbing.  Current pain is 8/10.  He has pain around  his left lower costal margin as well as his left low back as well as  right ankle.  He does have a right ankle contracture.  He has had fusion  surgery.   He walks without assistive device.  He walks 15 minutes at a time.  He  climbs steps.  He drives.   The interval medical history, started on a testosterone topical agent  given his low testosterone level which I suspected at my last visit due  to his long-term opiate analgesics.  I did discuss while supplementation  might be helpful, another consideration to see if he can come off his  opiate analgesics which he is reluctant to do at this time.   He has had no signs of abuse or early refill request, etc.   His Oswestry disability index was completed today.  His score 17/45,  37%, compares with 46% last visit, this is bordering on an improvement.  His Neurontin was increased to 300 mg q.i.d. last month.   PHYSICAL EXAMINATION:  GENERAL:  In no acute distress.  Mood and affect  appropriate.  He has some tenderness toward the left lower ribs and  tenderness in the right lower back.  Range of motion limitation to right  ankle.  Gait favors the right lower extremity.  His deep tendon reflexes  are 1+ bilateral lower extremities.   IMPRESSION:  1. Intercostal neuralgia left side.  2. Lumbar post-laminectomy syndrome.  3. Ankylosis right ankle.   PLAN:  1. We will increase his Neurontin further to 400 mg q.i.d.  2. Continue MS Contin 30 mg t.i.d.  3. MSIR 15 mg per day.  4. We will see him back in 2 months, nurse visit in 1 month compliance      monitoring.      Erick Colace,  M.D.  Electronically Signed     AEK/MedQ  D:  06/07/2008 09:26:45  T:  06/07/2008 22:35:53  Job #:  [id number]   cc:   Desmond Dike  [facility]  [place]

## 2011-01-13 NOTE — Assessment & Plan Note (Signed)
[facility]                            CARDIOLOGY OFFICE NOTE   NAME:Bree, YONATAN GUITRON                        MRN:          [id number]  DATE:06/22/2007                            DOB:          June 29, 1951    REASON FOR VISIT:  Preoperative assessment and followup on cardiac  studies.   HISTORY OF PRESENT ILLNESS:  I saw Mr. Riff back in September.  He is  being considered for a C3-4 and C5-6 anterior cervical decompression  which was actually already scheduled for November the third.  Presently  Mr. Riff is undergoing a pulmonary evaluation by Dr. Craige Cotta, given  abnormal CT scan findings.  He actually recently underwent a  bronchoscopy which was fairly nonspecific and the plan at this point is  symptom observation and repeat imaging per Dr. Craige Cotta.  Symptomatically,  Mr. Vara Guardian does experience a fairly pleuritic sharp left-sided pain and  also not related to this, and exertional dyspnea with chest tightness  particularly with inclines or walking up to 150 yards.   Cardiac history Includes an ischemic cardiomyopathy with an ejection  fraction of approximately 45% status post previous percutaneous  interventions including most recently a bare metal stent in the right  coronary artery in 2006.   I referred Mr. Riff for a followup Myoview which was just recently done  on October 20th and this revealed a left ventricular ejection fraction  of 45% with moderate ischemia in the lateral wall.  This was an  Adenosine study and otherwise no diagnostic ST segment changes were  noted on electrocardiogram.  I reviewed these results with him today.   I spoke with Dr. Craige Cotta by phone and we discussed the patient's pulmonary  status.  I also reviewed with him the patient's prior history of  bilateral pulmonary emboli in the postoperative setting, status post  inferior vena cava placement.  Mr. Vara Guardian has been on his Coumadin now for  over a year and it may be possible to  discontinue the medication,  presuming he otherwise has no obvious thrombophilia at baseline.   ALLERGIES:  No known drug allergies.   PRESENT MEDICATIONS:  1. Coumadin as followed through our [facility].  2. Coreg 12.5 mg p.o. b.i.d.  3. Lisinopril 40 mg p.o. daily.  4. Imdur 30 mg p.o. daily.  5. Enteric coated aspirin 325 mg p.o. daily.  6. Hydrochlorothiazide 25 mg p.o. daily.  7. Potassium 10 mEq p.o. daily.  8. Nitroglycerin 0.4 mg sublingual p.r.n.  9. Percocet p.r.n.   REVIEW OF SYSTEMS:  As described in the history of present illness.  No  palpitations or syncope.  No lower extremity edema.  No hemoptysis.   EXAMINATION:  Weight 244 pounds, blood pressure 122/84, heart rate 72  and regular. This is an overweight male in no acute distress.  HEENT:  Conjunctivae is normal, oropharynx is clear.  NECK:  Supple, no elevated jugular venous pressure.  No carotid bruits.  LUNGS:  Generally clear. Coarse breath sounds.  CARDIAC EXAM:  Reveals a regular rate and rhythm, no S3 gallop.  ABDOMEN:  Soft, nontender, normal active bowel sounds.  EXTREMITIES:  Exhibit no frank pitting edema.  Distal pulses 1+.  SKIN:  Warm and dry.  MUSCULOSKELETAL:  No kyphosis is noted.  NEUROPSYCHIATRIC:  Patient is alert and oriented x3.   Recent electrocardiogram with stress-testing demonstrated normal sinus  rhythm at 67 beats per minute.   IMPRESSION AND RECOMMENDATIONS:  1. Preoperative assessment in the setting of known cardiovascular      disease and potentially chronic lung disease, as yet not defined.      Mr. Vara Guardian has evidence of an ischemic cardiomyopathy with an      ejection fraction of 45% and is status post prior percutaneous      interventions including most recently a bare metal stent to the      right coronary artery in 2006.  His recent Myoview scan is      abnormal, indicating lateral wall ischemia and he has had some      exertional dyspnea and chest tightness.  I  suspect his sharp      pleuritic lateral thoracic pain is non cardiac however.  At this      point would delay surgery pending further cardiac evaluation to      include a cardiac catheterization to assess the potential for      revascularization options.  I will hold Coumadin and not plan to      resume it until we have done a basic screening work up for      thrombophilia (protein C and S, antithrombin 3, anticardiolipin,      factor V Leiden, etc.).  Otherwise will continue present      medications.  Will plan to arrange his cardiac catheterization for      Monday once he has been off Coumadin for an adequate period of time      and can proceed from there regarding further recommendations.  2. History of postoperative bilateral pulmonary emboli status post      inferior vena cava filter placement. Patient has been on Coumadin      greater than a year.  Will work through this as outlined above.  3. Abnormal chest CT findings including lymphadenopathy/adenopathy,      increased interstitial markings with central lobular nodules, and      nonspecific ground glass opacities.  This is      followed by Dr. Craige Cotta and the patient is status post recent      bronchoscopy with fairly nonspecific findings.     Jonelle Sidle, MD  Electronically Signed    SGM/MedQ  DD: 06/22/2007  DT: 06/22/2007  Job #: [id number]   cc:   Cristi Loron, M.D.  Coralyn Helling, MD  Desmond Dike, M.D.

## 2011-01-13 NOTE — Procedures (Signed)
NAME:  Fletes, Korde                 ACCOUNT NO.:  [id number]   MEDICAL RECORD NO.:  [id number]          PATIENT TYPE:  REC   LOCATION:  [facility]                          FACILITY:  [facility]   PHYSICIAN:  Erick Colace, M.D.DATE OF BIRTH:  07-26-1951   DATE OF PROCEDURE:  DATE OF DISCHARGE:                               OPERATIVE REPORT   Mr. Figueira returns today after last on December 04, 2008.  He has switched  back to MS Contin 30 mg t.i.d., MSIR 15 mg daily.  His pain persists  despite his medication changes.  He is here for tibial nerve block to  help release with his right ankle pain.  He has had good relief with  prior tibial nerve block.   Informed consent was obtained after describing the risks and benefits of  the procedure with the patient.  These include bleeding, bruising,  infection, elects to proceed and has given written consent.  The patient  was placed prone on exam table.  External DC stim applied using EMG  stimulator, followed by marking the area with pen and prepping with  Betadine and alcohol, and a 22-gauge, 40-mm needle electrode was  inserted under E-Stim guidance.  Plantar flexion twitch was obtained,  confirmed at 1 milli amp followed by injection with 5% phenol x4 mL.  The patient tolerated the procedure well.  Post-injection instructions  given.      Erick Colace, M.D.  Electronically Signed     AEK/MEDQ  D:  01/01/2009 16:59:11  T:  01/02/2009 07:41:20  Job:  [id number]

## 2011-01-13 NOTE — Procedures (Signed)
NAME:  [name], [name]                 ACCOUNT NO.:  [id number]   MEDICAL RECORD NO.:  [id number]          PATIENT TYPE:  REC   LOCATION:  [facility]                          FACILITY:  [facility]   PHYSICIAN:  Erick Colace, M.D.DATE OF BIRTH:  1951/08/09   DATE OF PROCEDURE:  11/14/2007  DATE OF DISCHARGE:                               OPERATIVE REPORT   PROCEDURE:  Right tibial nerve block under E-stim guidance.   INDICATIONS:  Chronic right postop ankle pain status post ORIF for  complex fracture.   Informed consent was obtained after describing risks and benefits of the  procedure to the patient.  These include bleeding, bruising, infection.  He elects to proceed and has given written consent.  The patient placed  prone on exam table.  External DC stim applied, right popliteal fossa  plantar flexion twitch obtained.  Area marked, prepped with Betadine  then entered with 22-gauge 40 mm needle electrode under E-stim guidance,  obtained plantar flexion twitch confirmed at 1.5 mA, then 4 mL of 1%  0.5% Sensorcaine injected after negative drawback for blood.  The  patient tolerated the procedure well.  Post injection instructions  given.      Erick Colace, M.D.  Electronically Signed     AEK/MEDQ  D:  11/14/2007 11:38:14  T:  11/14/2007 12:49:19  Job:  [id number]   cc:   [facility] Dr. Netta Cedars, [place]

## 2011-01-13 NOTE — Procedures (Signed)
NAME:  [name], [name]                 ACCOUNT NO.:  [id number]   MEDICAL RECORD NO.:  [id number]           PATIENT TYPE:   LOCATION:                                 FACILITY:   PHYSICIAN:  Erick Colace, M.D.DATE OF BIRTH:  1950/10/05   DATE OF PROCEDURE:  DATE OF DISCHARGE:                               OPERATIVE REPORT   Right L3 selective nerve root block. The patient returns today.  He been  off Plavix now for 9 days.  He continues have right lower extremity pain  which is only partially relieved with tibial nerve block.  His pain is  only partially responsive to narcotic analgesic medications and  conservative care.   The patient has limitation of ADLs including walking and bending and  prolonged standing due to right lower extremity pain.   Informed consent was obtained after describing risks and benefits of the  procedure to the patient.  These include bleeding, bruising, infection,  and he elects to proceed and has given written consent.  The patient was  placed prone on the fluoroscopy table.  Betadine prep, sterile drape.  A  25-gauge 1-1/2 inch needle was used to anesthetize skin and subcu tissue  with 1% lidocaine x2 mL, and then a 22-gauge 3-1/2 inch spinal needle  was inserted under fluoroscopic guidance into the L3-4 right  intervertebral foramen.  AP,  lateral and oblique imaging utilized.  Omnipaque 180 initially showed vascular uptake.  The needle was  repositioned and good nerve root spread was identified followed by  injection of 1 mL of 10 mg/mL dexamethasone and 1 mL of 1% lidocaine.  The patient tolerated the procedure well.  Post injection vitals stable.  Post injection instructions given.  Return in approximately 3 weeks,  possible translaminar epidural steroid injection should the  transforaminal route not be very effective.      Erick Colace, M.D.  Electronically Signed     AEK/MEDQ  D:  01/02/2008 09:44:43  T:  01/02/2008 10:26:00   Job:  [id number]

## 2011-01-13 NOTE — Assessment & Plan Note (Signed)
[name] returns today.  I last saw him on February 05, 2009.  We were to do  a caudal epidural injection, however, his cardiologist said that he  could not come off until November.  He has had some increased back pain.  He golfed 18 holes on both Sunday and Monday, had not golfed that much  in a long time.  No new lower extremity pain.  No bowel or bladder  dysfunction.  His pain is mainly in the center of his back as well as  toward his left hip.  His pain is described as sharp stabbing, and  aching.  Improves with medication, worse with walking, bending, and  standing.  He can walk 2-3 minutes at a time right now since Monday.   His Oswestry score is 50% today.   His Oswestry score last visit 52% prior to exacerbation.   PHYSICAL EXAMINATION:  VITAL SIGNS:  Blood pressure 146/66, pulse 59,  respirations 18, and O2 sat 99% on room air.  GENERAL:  Well-developed, well-nourished, overweight male in no acute  stress while sitting.  When he is standing, he does appear to be in mild-  to-moderate distress.  LYMPHS:  Favors the left side.  BACK:  He has some tenderness to palpation in the lumbar paraspinals,  but this is mild.  His main pain is when he is leaning toward the right  side.  EXTREMITIES:  He has no evidence of toe drag or knee instability.  His  sensation is intact in the lowers.  He has good strength in lowers and  negative straight leg raise test.  Upper extremity strength is full and  back has no deformity noted.  He has midline scar well healed.   IMPRESSION:  Lumbar postlaminectomy syndrome with exacerbation low back  pain following golfing, likely has twisting-type injury affecting the  facet joints.  Certainly, could be sacroiliac as well.   PLAN:  1. We will give him a shot of Toradol today 60 mg IM.  2. Flexeril 10 mg t.i.d. x2 weeks.  3. We will continue his current pain medicines including MS Contin 30      p.o. t.i.d. and MSIR 15 mg b.i.d.  Continue gabapentin 400  mg      q.i.d.  I will see him back have nursing see him in a month and I      will see him back in 2 months.  If he fails to improve, I will see      him sooner.  Reevaluate consider imaging studies.      Erick Colace, M.D.  Electronically Signed     AEK/MedQ  D:  03/07/2009 12:36:25  T:  03/08/2009 00:31:10  Job #:  [id number]   cc:   Jonelle Sidle, MD  [address]  [place], [place] [address]

## 2011-01-13 NOTE — Assessment & Plan Note (Signed)
[name] returns today after an absence since December 2009.  He lost  his Medicaid, but now has picked up on Medicare.  We had to switch him  to morphine sulfate immediate release 30 q.i.d. due to cost issues.  He  continues to have pain around the left lower ribs, low back, as well as  right ankle.  Average pain about 8.  Pain is described as sharp as well  as dull and stabbing and aching.  His rib pain is sharper and his ankle  pain is more dull.   He has been disabled since October 2007.  He can walk 15 minutes at a  time.  He climbs steps.  He drives.   REVIEW OF SYSTEMS:  Otherwise negative.  His Oswestry scale is 46%,  which compares with 48%, essentially no change compared to prior.   His blood pressure is 136/79, pulse 68, respirations 18, and 02 sat 98%  on room air.  General, no acute stress.  Orientation x3.  Affect is  bright.  His gait is with a limp.  Extremities without edema, except  right ankle.  It is hypersensitive to touch over the right ankle medial  scar.  His back has some mild tenderness to palpation of lumbar  paraspinals.  Lumbar spine range of motion is limited to 50% forward  flexion and extension.  He has some tenderness over the left lateral mid  axillary line area.   IMPRESSION:  1. Lumbar post-laminectomy syndrome.  2. Contracture of right foot, chronic postoperative pain.  3. Causalgia, right lower limb.  4. Intercostal neuralgia.   PLAN:  Given that he has insurance, again we will switch him back to his  previous regimen which was MS Contin 30 mg t.i.d. with MSIR 15 mg daily.  He will continue on his Neurontin.  He does have a 29-month supply of  that already.   We will schedule him back for a tibial nerve block.  This was helpful  for him in the past to help with some of his causalgia-type pain in the  right lower extremity.   Discussed with the patient, agrees with plan.      Erick Colace, M.D.  Electronically Signed     AEK/MedQ  D:  12/04/2008 13:14:11  T:  12/05/2008 01:34:53  Job #:  [id number]

## 2011-01-13 NOTE — Cardiovascular Report (Signed)
NAME:  [name], [name]                 ACCOUNT NO.:  [id number]   MEDICAL RECORD NO.:  [id number]          PATIENT TYPE:  OIB   LOCATION:  6524                         FACILITY:  [facility]   PHYSICIAN:  Bruce R. Juanda Chance, MD, FACCDATE OF BIRTH:  01/16/1951   DATE OF PROCEDURE:  06/27/2007  DATE OF DISCHARGE:  06/28/2007                            CARDIAC CATHETERIZATION   PROCEDURE:  Cardiac catheterization and percutaneous intervention.   CLINICAL HISTORY:  [name] is 60 years old and has had multiple  percutaneous coronary interventions on his right coronary artery most of  which were done at [facility].  His last intervention here was done by Dr.  Samule Ohm in 2006, which was a bare metal stent in his distal right  coronary artery.  We think this overlapped the previous stent in that  area.  He has two overlapping stents in the mid-right coronary artery,  one of which is a Cypher.  Recently he developed symptoms of chest pain  and was seen by Dr. Diona Browner and scheduled for outpatient  catheterization in the main laboratory.  He has cervical disk disease  and may need surgery by Dr. Lovell Sheehan in the future.   PROCEDURE:  The procedure was performed via the femoral arteries and  arterial sheath and 6-French preformed coronary catheters.  A front wall  arterial puncture was performed and Omnipaque contrast was used.  After  completion of the diagnostic study, we made a decision to proceed with  intervention on the focal in-stent restenotic lesion in the mid-right  coronary artery.   The patient was given Angiomax bolus and infusion.  He was loaded with  Plavix and given chewable aspirin earlier.  We used a 6-French right  saphenous vein bypass graft guiding catheter with side holes and a  Prowater wire.  We crossed the lesion with the wire without difficulty.  We used a 3 x 10 mm angioscope balloon and crossed the lesion without  much difficulty.  We inflated this twice up to 18 atmospheres for  45  seconds.  This did not completely eliminate the waste in the balloon.  We then performed intravascular ultrasound with an Atlantis IVUS  catheter.  This documented that the overlapping stents at the site of  the lesion were not fully expanded with a stent dimension that was just  a little bit more than 2.5 after inflation with the angioscope balloon.  There were two layers of stent and there was quite a bit of tissue  between the two layers.  We felt we could best help by expanding these  further with a larger balloon and high pressure.  For this reason, we  went back in with a 3.25 x 15 mm Quantum Maverick and performed two  inflations up to 22 atmospheres for about 30 seconds and finally  obtained full expansion of the balloon.  We then performed a final IVUS  run which showed better expansion of the stent.   RESULTS:  Left main coronary artery:  The left main coronary artery was  free of significant disease.   Left anterior descending artery:  The left anterior descending artery  gave rise to three diagonal branch and two septal perforators.  The LAD  was irregular.  There was a 90% ostial stenosis in the first diagonal  branch which was old.   Circumflex artery:  The circumflex artery was a codominant vessel that  supplied two marginal branches and two large posterolateral branches.  The two marginal branches were totally occluded and they both filled by  collaterals.  The second marginal branch was a large vessel that filled  well by collaterals from the posterolateral branches.   Right coronary artery:  The right coronary artery was a moderate-size  vessel that gave rise to a posterior descending branch.  There were  tandem overlapping stents in the midportion of vessel with 95% focal  stenosis near the proximal edge.  There were tandem overlapping stents  in the distal vessel extending in the posterior branch and there was 30%  narrowing in the proximal portion of these  stents.  There was also a 50%  narrowing in the proximal right coronary.   Left ventriculogram:  The left ventriculogram performed in the RAO  projection showed mild global hypokinesis.  Estimated ejection fraction  was 45%.   Following PTCA of the in-stent restenotic lesion in the mid-right  coronary artery, the stenosis improved from 95% to 20%.   CONCLUSION:  1. Coronary artery disease status post multiple percutaneous coronary      interventions as described above with 90% ostial stenosis in the      diagonal branch to the left anterior descending (old), total      occlusion of the first and second marginal branch of circumflex      artery (old), and 95% in-stent restenosis in the mid-right coronary      artery and mild global hypokinesis with an estimated fraction of      45%.  2. Successful percutaneous coronary intervention of the in-stent      restenotic lesion in the mid-right coronary artery using the      angioscope balloon and a high pressure noncompliant balloon with      improvement in percent of narrowing from 95% to 20%.   DISPOSITION:  The patient returned to the post anesthesia care unit for  further observation.  Although the patient did receive a stent, I would  recommend Plavix for a month.  I think he could have surgery on his neck  in six weeks as needed.      Bruce Elvera Lennox Juanda Chance, MD, [facility]  Electronically Signed     BRB/MEDQ  D:  06/27/2007  T:  06/28/2007  Job:  [id number]   cc:   Desmond Dike, M.D.  Jonelle Sidle, MD  Cardiopulmonary Lab  Cristi Loron, M.D.

## 2011-01-13 NOTE — Assessment & Plan Note (Signed)
INTERVAL MEDICAL HISTORY:  [name] is 60 year old male with lumbar  postlaminectomy syndrome, chronic right ankle pain due to ankle fusion  who has had chronic postoperative pain partially responsive to right  tibial nerve neurolysis which I performed last month.  In the interval  time, he has undergone cardiac catheterization with a stent placement.  He had restenosis of the RCA and instant restenosis of mid segment.  These stents placed, drug-eluting stent partially overlapping the old  stent and no postprocedural complication.  He has been maintained on  Plavix.  His ejection fraction was measured at 40-45%.   His pain is mainly in the back as well as right ankle described as  sharp, stabbing, constant, 8/10.  Sleep is fair.  His relief from meds  is okay.  It allows him to be functional and independent.  He can walk  10 minutes at a time, climbs step and drives.  He is independent with  all his self-care and mobility.  His Oswestry disability index is 48%.   EXAMINATION:  GENERAL:  In no acute chest pain.  Affect is appropriate.  His right ankle has limited range of motion approximately one fourth of  normal range.  His back has tenderness to palpation in the lumbar area.  He has pain over the lower ribs on the left side as well consistent with  the history of intercostal neuralgia.  His lower extremity strength and  deep tendon reflex are normal with exceptional right ankle which may be  influenced by his limited range of motion.   IMPRESSION:  1. Lumbar postlaminectomy syndrome.  2. 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  3. Causalgia of limb, right lower extremity.   PLAN:  He indicates that he be loosing his Medicaid, but then starting  up Medicare in approximately four months.  In the interval time, we will  maintain him on his current dose of Gabapentin 400 mg q.i.d. which  should be available in the generic form.   In terms of his  MS Contin, this will be the more expensive medication  and in fact would be cheaper if we switch him to Morphine Sulphate  immediate release 30 mg p.o. q.i.d.   He will pick up his scripts for the next 2 months and then he will see  me back basically at the beginning of April.  The patient agrees with  the plan.      Erick Colace, M.D.  Electronically Signed     AEK/MedQ  D:  08/28/2008 14:35:21  T:  08/29/2008 05:44:28  Job #:  [id number]   cc:   Jonelle Sidle, MD  [address]  [place], [place] [address]   Cristi Loron, M.D.  Fax: [phone]   Verne Carrow, MD  [address]  [place] [address]

## 2011-01-13 NOTE — Assessment & Plan Note (Signed)
[facility]                            CARDIOLOGY OFFICE NOTE   NAME:[name] [name] ACE                        MRN:          [id number]  DATE:02/08/2008                            DOB:          02-Feb-1951    PRIMARY CARE PHYSICIAN:  Dr. Sheria Lang.   REASON FOR VISIT:  Scheduled followup.   HISTORY OF PRESENT ILLNESS:  [name] [name] was actually on Dr. Ludwig Clarks  schedule today for some reason.  Fortunately, I was in the office and  was able to see him.  He came in for a routine visit, but tells me that  he has been experiencing recent nausea and even some emesis with general  anorexia.  He has had no obvious fevers, chills, abdominal pain, or  changes in his bowel habits.  He has had no anginal chest pain or  dyspnea.  He states that he was doing his usual activities, in fact,  worked out in the yard most of the Monday and got overheated.  Since  then, he has had the symptoms described.  He has not been able to take  his medicines regularly.  His electrocardiogram today looks stable with  decreased R-wave progression on baseline sinus rhythm at 69 beats per  minute.  There is no marked change compared to his prior tracing.  I had  orthostatics obtained, which demonstrated a change from as low as 55 up  into the mid 70s after standing for 5 minutes.  His blood pressure never  dropped below 115 systolic.  He reported symptoms of dizziness after  standing, although eventually got better after a few minutes.  He also  states that he has had some vertiginous symptoms, but no syncope.  He  has had no headache or visual changes.  I had blood work obtained in the  office and also had an IV placed for him to receive IV fluids.  He  seemed to be mildly dehydrated.  In reviewing his blood work, his white  blood cell count was 8.7, hemoglobin of 14.0, platelets 197.  Sodium  137, potassium 4.3, BUN 13, creatinine 0.8.  His bilirubin, alkaline  phosphatase, AST, ALT,  amylase, and lipase were all normal.   ALLERGIES:  No known drug allergies.   MEDICATIONS:  1. Gabapentin 300 mg p.o. t.i.d.  2. Carvedilol 12.5 mg p.o. b.i.d.  3. Klor-Con 10 mg p.o. daily.  4. Plavix 75 mg p.o. daily.  5. Imdur 30 mg p.o. daily.  6. Morphine sulfate 15 mg and 30 mg as directed.  7. Doxycycline 100 mg p.o. b.i.d.  8. Lyrica 50 mg p.o. t.i.d.  9. Lisinopril 40 mg p.o. daily.  10.Hydrochlorothiazide 25 mg p.o. daily.  11.Nitroglycerin p.r.n.   REVIEW OF SYSTEMS:  As per history of present illness.  Otherwise,  negative.   PHYSICAL EXAMINATION:  Blood pressure is 140/95, heart rate is 59,  ultimately the blood pressure settled down around systolic of 120-130.  He did not appear toxic.  No acute distress.  General malaise.  HEENT:  Conjunctiva is normal.  No scleral icterus.  Oropharynx  clear.  NECK:  Supple.  No elevated jugular venous pressure.  LUNGS:  Clear without labored breathing.  No wheezing noted.  CARDIAC:  Regular rate and rhythm.  No loud murmur or gallop.  ABDOMEN:  Soft, nontender.  Bowel sounds are present.  No guarding or  rebound.  EXTREMITIES:  Exhibit no frank pitting edema.  SKIN:  No rashes.  Distal pulses are 1-2+.   IMPRESSION:  1. Recent nausea, occasional emesis, and general anorexia.  Question      possible viral syndrome/gastroenteritis.  He has also had some      intermittent vertigo and a viral labyrinthitis could also be      considered.  His blood work is overall stable as is his EKG.  We      gave him nearly a liter of saline in the office, and he is feeling      somewhat better, although we talked about providing p.r.n.      prescriptions for meclizine and Phenergan temporarily until he can      see Dr. Sheria Lang within the week.  It seems unlikely that this is      related to cardiac decompensation.  He is not having any angina and      dyspnea.  2. History of cardiovascular disease with ischemic cardiomyopathy and      an  ejection fraction of 45%, status post drug-eluting stent      placement as well as bare-metal stent placement at the right      coronary artery in the past with repeat angioplasty in October      2008.  3. Hypertension.  4. History of chronic lateral left thoracic pain, status post T9 and      T11 intercostal nerve block.  This was recently done on January 30, 2008.     Jonelle Sidle, MD  Electronically Signed    SGM/MedQ  DD: 02/08/2008  DT: 02/09/2008  Job #: [id number]   cc:   Desmond Dike, MD

## 2011-01-13 NOTE — Assessment & Plan Note (Signed)
[name] returns today.  He has plaintive left-side pain rather than  back pain.  He states that this has really been bothering him quite a  bit lately.  He points to the left side of his ribs in the lower area,  rates it as an 8 out of 10, worse with walking and bending.  Relief from  meds is fair.  He is able to drive and climb steps, but is limited to 15  minutes at a time for walking distances.  This is a combination of his  left ankle contracture as well as his back and his left side pain.   His blood pressure was 133/72, pulse 73, respirations 18, O2 sat 97% on  room air.  GENERAL:  No acute distress.  Mood and affect appropriate.  His gait is with a limp, had decreased ankle range of motion,  essentially zero range of motion, left ankle, status post fusion.  His  left side has multiple surgical scars in the area and tenderness under  the lower rib margin at the mid axillary line which reproduces as pain.  This pain does shoot around toward the front and toward the back.   IMPRESSION:  1. Intercostal neuralgia, postoperative.  He has had thoracotomy due      to T11 corpectomy, 2007.  His pain is only partially responsive to      narcotic analgesics.  He has been on MS Contin 30 mg t.i.d.  He      does have breakthrough pain.  Will add on some MS IR 15 mg per day      and continue the voltaren gel, and he can take that over that area.      Will also increase his Neurontin to 300 p.o. b.i.d. for a week, and      then increase to t.i.d.  2. He is scheduled back for intercostal nerve block under fluoroscopic      guidance.      Erick Colace, M.D.  Electronically Signed     AEK/MedQ  D:  11/28/2007 16:32:12  T:  11/28/2007 17:03:55  Job #:  [id number]   cc:   Elicia Lamp, MD  [facility]  [address]  [place], [place] [address]

## 2011-01-13 NOTE — Procedures (Signed)
NAME:  [name], [name]                 ACCOUNT NO.:  [id number]   MEDICAL RECORD NO.:  [id number]          PATIENT TYPE:  REC   LOCATION:  [facility]                          FACILITY:  [facility]   PHYSICIAN:  Erick Colace, M.D.DATE OF BIRTH:  [birth year]   DATE OF PROCEDURE:  DATE OF DISCHARGE:                               OPERATIVE REPORT   PROCEDURE:  Intercostal nerve block under fluoroscopic guidance, T10  performed at the posterior axillary line as well as the mid axillary  line.   The area was previously marked with a marker. The patient placed in  prone position.  Initially the area was prepped with Betadine and  alcohol. A 25-gauge inch and a half needle was inserted onto the rib  area and 1 mL of 1% lidocaine injected followed by insertion of a 22-  gauge inch and a half needle under fluoroscopic guidance starting at the  inferior border of the rib, bone contact made, needled walk off.  Omnipaque 180 demonstrated no intravascular uptake followed by injection  of 1/2 mL of 40 mg/mL Depo-Medrol plus 1.5 mL of 2% lidocaine and then  the mid axillary line position was selected, marked, prepped with  Betadine, entered with a 25-gauge inch and a half needle and 1 mL of 1%  lidocaine injected followed by insertion of 22 gauge inch and a half  needle, needle walked off the inferior aspect of T10 and Omnipaque 180  under live fluoro demonstrated no intravascular uptake followed by  injection of one half mL of 40 mg/mL Depo-Medrol and 1.5 mL of 2% MPF  lidocaine.  The patient tolerated the procedure well.  Pre injection  pain level 6/10, post injection 4/10. I will see him back in 2 weeks.  He will have right L3-4 transforaminal upper epidural steroid injection  for right lower extremity knee pain only partially relieved with  medications.      Erick Colace, M.D.  Electronically Signed     AEK/MEDQ  D:  12/12/2007 14:07:49  T:  12/12/2007 14:30:54  Job:  [id number]

## 2011-01-13 NOTE — Procedures (Signed)
NAME:  [name], [name]                 ACCOUNT NO.:  [id number]   MEDICAL RECORD NO.:  [id number]          PATIENT TYPE:  REC   LOCATION:  [facility]                          FACILITY:  [facility]   PHYSICIAN:  Celene Kras, MD        DATE OF BIRTH:  19-May-1951   DATE OF PROCEDURE:  03/27/2008  DATE OF DISCHARGE:                               OPERATIVE REPORT   [name].  I evaluated  him and I reviewed her health and history form, 14-point review of  systems.   1. [name] comes today successful to T9 intercostal nerve block with      radiofrequency neural ablation, some residual pain with T11.      possibly even T10, the hardware is evident, he had extensive      surgical invasion  with interference of activities of daily living,      quality of life, and nonrestorative sleep at night, with frequent      night awakenings.  2. Other modifiable features in health profile discussed.  Again,      weight control, home-based therapy as best outcome positioning.  I      have reviewed the risks, complications and options available to      him.  I do believe going on to T11 as described most problematic      level and reinforce with radiofrequency neural ablation.  He is in      his best interest.  This will be pulse technology after block, and      follow him expectantly.  He thinks that he is able to move around      better after the T9 neural modulation, better restorative sleep      capacity but still has frequent catching.  It is migrated,      consistent with T11.  I will go ahead and proceed with this as our      best nonsurgical approach, follow him expectantly, as he does not      want to escalate controlled substances.   Objectively, I think the T9 region has improved, better range of motion,  side bending positive.  Pain with extension and nothing new  neurologically.  This is right around the scar probably point of most  problematic intrusion, and  I have related that with him.  It is on the  left side.   He does not have any surface allodynia, nothing new neurologically.   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   PLAN:  Intercostal nerve block T11, with radiofrequency neural ablation  pulse technology.  He is consented.  Risks, complications and options  are fully outlined.  Questions are answered, discussed in lay terms.  Followup in 1 month.  No further interventions are warranted, see how he  does.  At some point, he may actually have been ended up to be as  peripheral nerve stim candidate, or central candidate.   He is consented.   The patient was taken to the fluoroscopy  suite and placed in supine  position.  Back prepped and draped in the usual fashion.  Using a 10-mm  active tip, radiofrequency cannula we advance to the T11 intercostal  nerve confirmed placement in multiple fluoroscopic positions at greater  than 5 cm lateral.  After confirmation is made under fluoroscopic  observation, with no CSF, heme or paresthesia, we inject 2 mL of  Marcaine 0.25% MPF and 20 of Aristocort.  Pulse technology follows at  100 seconds, 42 degrees cycled, and he tolerated this well.  No  complications identified.  Questions were answered at discharge.  We  discussed potential complications such as pneumothorax in followup and  he is aware.           ______________________________  Celene Kras, MD     HH/MEDQ  D:  03/27/2008 10:32:03  T:  03/27/2008 23:35:04  Job:  [id number]

## 2011-01-13 NOTE — Group Therapy Note (Signed)
REFERRING PHYSICIAN:  Dr. Sheria Lang in [place] at [facility].   REASON FOR CONSULTATION:  Evaluation of chronic back pain.   CHIEF COMPLAINT:  Back pain, left flank pain, bilateral knee pain, right  ankle pain, and right shoulder pain.   HISTORY OF PRESENT ILLNESS:  A 60 year old male with a complicated past  medical history consisting of cervical pain with cervical spine surgery,  lumbar pain with thoracolumbar spine surgery, and shoulder pain.  In  addition he has had rotator cuff repair and carpal tunnel release as  well as bilateral knee replacements.  He states overall he has had 25  surgeries which appears about right given the numerous scars he has  shown me and the records that I have reviewed.  His most severe area of  pain is the right ankle at the current time followed by his back and his  flank.  His right shoulder and knees are less painful than the other  areas.   He describes this pain as sharp, constant, stabbing, and aching, rated  8/10 on average and interferes with activity at 7/10 level.  The pain is  worse with walking, particularly his ankle pain as well as standing.  Improves with medications.  His relief with medicines is fair to good.  He can walk 5-10 minutes at a time.  He climbs steps, he drives.  He  does not use an assistive device to ambulate.  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   REVIEW OF SYSTEMS:  Positive for trouble walking and shortness of  breath.   PAST SURGICAL HISTORY:  He has undergone a C3-4 and C5-6 anterior  diskectomy decompression and anterior interbody arthrodesis, PEEK cage  per Cristi Loron, M.D. on September 19, 2007.  He has had a cardiac  catheterization on June 27, 2007, including angioplasty noted.  He  had an ejection fraction of 45%.  Plavix for a month was recommended.  He had a bronchoscopy to rule out sarcoidosis with no  endobronchial  lesions noted, June 09, 2007.  He had a T11 corpectomy, planing from  T10 to T12 per Dr. Lovell Sheehan on June 14, 2006.  He has some  postoperative arrhythmia, pleural effusion, postoperative infection.  He  had a T12 diskectomy with postoperative hematoma on July 02, 2006,  with drainage per Ines Bloomer, M.D.  He had an L5-S1 fusion,  laminectomy, posterior arthrodesis, spacer placement, and pedicle screws  at L5-S1 on December 28, 2002, and this was per Sharolyn Douglas, M.D.  Also knee  replacement in 1996 and 1994 and right ankle ORIF approximately 2004 and  right ankle fusion in approximately 2007.   PAST MEDICAL HISTORY:  Significant for pulmonary emboli, cardiac  arrhythmia.   Last imaging studies of his lumbar spine showed the intact fusion levels  T10 through T12, mild epidural scarring at that level, moderate right  foraminal narrowing T11-12, L1-2 had broad based fold eccentric to the  right, L2-3 focal perfusion with potential impingement on left L3 nerve  root.   CURRENT MEDICATIONS:  1. Oxycodone 10 mg he tablets about six per day and on this for a      number of years.  He attributes being on this due to all of his      surgeries.  2. Hydrocodone 10 mg about three per day.   OTHER TREATMENTS TRIED:  No physical therapy  and no injections.   PHYSICAL EXAMINATION:  VITAL SIGNS:  Blood pressure 111/64, pulse 76,  respiratory rate 18, and O2 saturations 97% on room air.  GENERAL:  No acute distress.  NECK:  Small amount of bruising around the anterior cervical incision.  Neck range of motion, however, is 75% forward flexion, extension,  lateral rotation, and bending.  NEUROLOGY:  He has 5/5 strength bilateral deltoid, biceps, triceps, and  grip.  He has 5/5 strength in the hip flexors, knee extensors, and left  ankle dorsiflexor.  Right ankle dorsiflexor is trace movement, both  plantar flexor and dorsiflexor, he has right ankylosis.  He has full  range of  motion at the hips and knees, but has knee instability medially  and laterally bilateral knees, history of TKR.  Left ankle has normal  range of motion.  Upper extremity range of motion is normal in  shoulders, elbows, and wrists.  Negative impingement signs at the  shoulders bilaterally.  He has 2+ deep tendon reflexes bilateral biceps,  triceps, brachial radialis.  1+ bilateral knees and 1+ left ankle.  Right ankle could not elicit.  Sensation is normal in C5, C6, C7, C8,  T1, L2, L2, L3, L4, L5, S1 dermatomes.   His right ankle has bony enlargement.  He has medial and lateral  surgical scars.  He has hypersensitivity to touch over the scar areas.  He has good pedal pulses and posterior tibial on the right as well as  left lower extremity.  He has no hypersensitivity to touch to the skin  of the foot, however, decreased left saphenous distribution as well as  the peroneal nerve distribution sensation.   IMPRESSION:  1. 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  He may have some saphenous      nerve involvement.  This may be just peroneal nerve and saphenous.      He may benefit from trial blocks tibial and peroneal on the right      and see how this relates to his pain.  Consider lumbar sympathetic      blocks if peripheral blocks are not helpful.  2. Consider EMG right lower extremity.  3. In terms of his back, he may benefit from lumbar facet medial      branch blocks given that he does not appear to have any significant      radicular component.  There is the possibility that some of his      right lower extremity pain could be related to L3-4 biforaminal      narrowing and this may account for some of his knee pain as well.   We will check urine drug screen.  I have advised him to stop his  hydrocodone taking it in conjunction with his oxycodone.  I would  consider trial of Fentanyl patch versus another long acting  agent such  as MS Contin.  This will be after urine drug screen.  For now we will  start him on Neurontin 100 mg q.h.s. and working up to b.i.d. and then  t.i.d. and a voltaren gel sample as well as prescription for his right  ankle.   Thank you for this interesting consultation and I will keep you apprised  of his progress.      Erick Colace, M.D.  Electronically Signed     AEK/MedQ  D:  10/25/2007 16:04:21  T:  10/26/2007 00:42:29  Job #:  [id number]   cc:   Dr. Sheria Lang, [place], [place] [phone]   Cristi Loron, M.D.  Fax: [phone]

## 2011-01-13 NOTE — Assessment & Plan Note (Signed)
[facility]                            CARDIOLOGY OFFICE NOTE   NAME:Felicetti, RICHARDS PHERIGO                        MRN:          [id number]  DATE:07/20/2008                            DOB:          [birth year]    PRIMARY CARE PHYSICIAN:  Beulah Gandy. Sheria Lang, M.D.   REASON FOR VISIT:  Routine Cardiology followup.   HISTORY OF PRESENT ILLNESS:  Mr. Franek is a pleasant 60 year old  Caucasian male with a past medical history significant for coronary  artery disease status post multiple percutaneous coronary interventions  with stent placement as well as hypertension and osteoarthritis who was  previously followed in this office by Dr. Nona Dell and comes in  today to establish care at my clinic.  Mr. Gulbranson tells me that he has  recently had the onset of shortness of breath with exertion as well as a  burning chest sensation with exertion that feels similar to the episodes  prior to his last coronary intervention.  He denies any palpitations,  diaphoresis, nausea, vomiting, dizziness, near syncope, syncope,  orthopnea, PND, or change in lower extremity edema.  He was helping his  grandson with a tree stand last week and while walking out of the woods,  had the onset of chest burning without radiation.  However, there was an  increase in his shortness of breath during this episode.  His most  recent heart catheterization was in October 2008 at which time Dr.  Juanda Chance performed a cutting balloon angioplasty of the stented segment of  the right coronary artery.  The patient was noted at that time to have  other coronary obstructions in the branch vessels as well.   PAST MEDICAL HISTORY:  1. Hypertension.  2. Coronary artery disease status post prior coronary interventions      with placements of a drug-eluting and bare-metal stents in the      right coronary artery.  3. Osteoarthritis.   PAST SURGICAL HISTORY:  1. Bilateral carpal tunnel release.  2. Rotator  cuff repair.  3. Six knee surgeries leading to bilateral knee replacements.  4. Traumatic injury to the right ankle requiring fusion of the right      ankle joint.  5. Three back surgeries.  6. Placement of Greenfield filter.  7. Thoracotomy for access to a T10-T12 spinal fusion that was      complicated by hemothorax and a 44-day stay in [facility].   ALLERGIES:  No known drug allergies.   CURRENT MEDICATIONS:  1. Coreg 12.5 mg twice daily.  2. Klor-Con 10 mEq once daily.  3. Plavix 75 mg once daily.  4. Imdur 30 mg once daily.  5. Morphine sulfate 15 mg once daily.  6. Lisinopril 40 mg once daily.  7. Hydrochlorothiazide 25 mg once daily.  8. Neurontin 400 mg 4 times daily.  9. Voltaren gel once daily.  10.Enteric-coated aspirin 325 mg once daily.  11.Morphine extended-release 30 mg once daily.   SOCIAL HISTORY:  The patient does not use tobacco, alcohol, or illicit  drugs.  He is married and has 3  children.  He is currently on  disability.   FAMILY HISTORY:  The patient's mother died from myocardial infarction.  His father died from lung complications from being a Forensic scientist.   REVIEW OF SYSTEMS:  As stated in history present illness, is otherwise  negative.   PHYSICAL EXAMINATION:  VITALS:  Blood pressure 138/84, pulse 76 and  regular, respirations 12 and nonlabored.  GENERAL:  He is a pleasant middle-aged Caucasian male, in no acute  distress.  He is alert and oriented x3.  PSYCHIATRIC:  Mood and affect are normal.  MUSCULOSKELETAL:  Muscle strength and tone are normal.  NEUROLOGICAL:  No focal neurological deficits.  SKIN:  Warm and dry.  NECK:  No JVD.  No carotid bruits.  No thyromegaly.  No lymphadenopathy.  HEENT:  Normal.  LUNGS:  Clear to auscultation bilaterally without wheezes, rhonchi, or  crackles noted.  CARDIOVASCULAR:  Regular rate and rhythm without murmurs, gallops, or  rubs noted.  ABDOMEN:  Soft and nontender.  Bowel sounds are  present.  EXTREMITIES:  The right ankle has previous scars from trauma and  surgeries.  The right ankle was slightly swollen and deformed.  The left  ankle and foot have no evidence of edema.  Pulses are 2+ in all  extremities.   DIAGNOSTIC STUDIES:  There are no diagnostic studies for review today.   ASSESSMENT AND PLAN:  This is a pleasant 60 year old Caucasian male with  a history of prior obstructive coronary artery disease with prior stent  placements in the right coronary artery as well as hypertension who  presents to our Cardiology Clinic today and describes exertional angina  as well as exertional dyspnea that is highly suggestive of recurrence of  his obstructive coronary artery disease.  I think at this time it would  be most appropriate to proceed with a diagnostic left heart  catheterization.  We will have the patient come into the Outpatient  Heart Catheterization Laboratory, Monday, July 23, 2008 for a left  heart catheterization.  We will plan on performing pre-catheterization  laboratory studies today as well as a chest x-ray.  The patient is  encouraged to present to a local Emergency Department this weekend if he  has  recurrence of his chest discomfort that is not relieved with sublingual  nitroglycerin.  I will plan on following him in this office in 1-2 weeks  following his heart catheterization.     Verne Carrow, MD  Electronically Signed    CM/MedQ  DD: 07/20/2008  DT: 07/21/2008  Job #: [id number]   cc:   Elicia Lamp, MD

## 2011-01-13 NOTE — Procedures (Signed)
NAME:  [name], [name]                 ACCOUNT NO.:  [id number]   MEDICAL RECORD NO.:  [id number]          PATIENT TYPE:  REC   LOCATION:  TPC                          FACILITY:  [facility]   PHYSICIAN:  Erick Colace, M.D.DATE OF BIRTH:  1951-08-18   DATE OF PROCEDURE:  DATE OF DISCHARGE:                               OPERATIVE REPORT   PROCEDURE:  Right tibial nerve neurolysis with phenol.  He had about a  50% relief of pain for 12-hour duration with Sensorcaine which was  performed about 2 weeks ago.  He has had gradual return of pain.  His  pain is only partially responsive to medication management and other  conservative care.   Informed consent was obtained after describing risks and benefits of the  procedure to the patient.  These include bleeding, bruising, infection  as well as neuritis of the tibial nerve.  He elects to proceed and has  given written consent.  The patient placed prone on exam table.  External DC stim using EMG stimulator utilized.  Plantar flexion twitch  obtained, marked, prepped with Betadine, entered with 22 gauge 40 mm  needle electrode connected to [facility] guidance.  Positive plantar flexion  twitch obtained and confirmed.  Then a solution of 5% phenol x4 ccs  injected with abolition of plantar flexion twitch.  The patient  tolerated procedure well.  Post injection instructions given.      Erick Colace, M.D.  Electronically Signed     AEK/MEDQ  D:  11/28/2007 04:54:09  T:  11/29/2007 81:19:14  Job:  [id number]

## 2011-01-13 NOTE — Cardiovascular Report (Signed)
NAME:  [name], [name]                 ACCOUNT NO.:  [id number]   MEDICAL RECORD NO.:  [id number]          PATIENT TYPE:  INP   LOCATION:  6529                         FACILITY:  [facility]   PHYSICIAN:  Verne Carrow, MDDATE OF BIRTH:  1951-08-16   DATE OF PROCEDURE:  07/23/2008  DATE OF DISCHARGE:                            CARDIAC CATHETERIZATION   PROCEDURES PERFORMED:  1. Left heart catheterization.  2. Selective coronary angiography.  3. Left ventricular angiogram.  4. Percutaneous coronary intervention with placement of 2 PROMUS drug-      eluting stents in the mid and distal right coronary artery.  5. Placement of an Angio-Seal femoral artery closure device in the      right femoral artery.   OPERATOR:  Verne Carrow, MD   INDICATIONS:  A 60 year old Caucasian male with known coronary artery  disease with prior stenting in the right coronary artery who presents  with recurrent chest pain.  The patient had in-stent restenosis in the  mid right coronary artery in October 2008, at which time a cutting  balloon was used for angioplasty.   PROCEDURE IN DETAIL:  The patient was brought into the catheterization  laboratory after signing informed consent for the procedure.  The right  groin was prepped and draped in a sterile fashion.  Lidocaine 1% was  used for local anesthesia.  A 5-French sheath was inserted into the  right femoral artery without difficulty.  Standard diagnostic catheters  were used to perform selective angiography of the left coronary system  and the right coronary artery.  A 5-French pigtail catheter was used to  cross the aortic valve into the left ventricle.  Following performance  of the left ventricular angiogram, the pigtail catheter was pulled back  across the aortic valve with no significant pressure gradient measured.   The patient was found to have significant in-stent restenosis in the mid  right coronary artery.  We elected to proceed to  do angioplasty at this  point.  The patient was given a bolus of Angiomax and started on an  Angiomax drip.  He was given an additional 300 mg of Plavix.  A 6-French  sheath was then inserted into the right femoral artery over a wire.  A  JR-4 guiding catheter with side holes was used to engage the right  coronary artery.  A Cougar intracoronary wire was passed down the right  coronary artery without difficulty.  Initially, I used a 3.0 x 10 mm  cutting balloon inside the area of in-stent restenosis.  I made several  inflations with this and the more distal portion of the mid stented  segment and the proximal portion.  This was a subpar angiographic result  with 60-70% residual stenosis by angiography.  There was an area noted  distal to the stented segment in the midportion that had no prior  stenting.  This area was between the distal stent and the mid stent.  The stenosis was 70-80%.  I then elected to go down with a 2.5 x 15 mm  Apex balloon and predilate this area.  Angiography at  this point  demonstrated that the area in the unstented portion should be stented.  A 2.75 x 28 mm PROMUS drug-eluting stent was placed beginning in the  distal right coronary artery overlapping with an old stent.  This was  inflated to 12 atmospheres.  I then placed a 3.0 x 28 mm PROMUS drug-  eluting stent overlapping with the distal stent back into the proximal  portion of the mid segment.  This was inflated to 14 atmospheres.  At  this point, a 3.25 x 20 mm noncompliant balloon was used for post-  dilatation of the lesion.  This balloon was inflated to 8 atmospheres in  the distal portion of the stented segment.  I then pulled it back and  inflated it to 10 atmospheres in the more distal portion of the mid  stented segment.  I then pulled it back to the proximal portion of the  mid stented segment and inflated to 15 atmospheres.  There was an  excellent angiographic result.  Of note, there is disease  that  represents 60-70% stenosis in the proximal portion of the right coronary  artery.  I elected not to perform angioplasty in this area secondary to  the patient's poor tolerance to stenting therapy in the past.  There was  good runoff to the distal vessel following the stenting procedure.  The  patient tolerated the procedure well.  An Angio-Seal femoral artery  closure device was placed in the right femoral artery for arterial  closure.   ANGIOGRAPHIC FINDINGS:  1. The left main coronary artery is free of any significant disease.      This bifurcates into the circumflex and the LAD.  2. The left anterior descending gives rise to 3 diagonal branches and      2 septal perforators.  There are luminal irregularities noted in      the proximal and distal LAD.  There is a 40-50% stenosis in the mid      LAD.  There is a 90% ostial stenosis in the first diagonal branch      which is known to be old.  3. The circumflex artery is a codominant vessel that has 2 moderate-      sized marginal branches and 2 large posterolateral branches.  The 2      marginal branches are known to be totally occluded.  The second      marginal branch fills via left collaterals.  There is a 50%      stenosis in the midportion of the circumflex artery.  4. The right coronary artery is a moderate-sized vessel that has      stents noted in the beginning of the mid segment down to the      beginning of the distal segment.  There were also stents noted in      the distal right coronary artery.  There is a small segment of      nonstented vessel at the very distal portion of the mid segment.      There is a 99% in-stent restenosis in the mid right coronary      artery.  This is followed by a 70% in-stent restenosis in the mid      right coronary artery.  The nonstented segment at the distal      portion of the mid segment has 70% stenosis.  There is 40-50%      stenosis noted in the distal stented segment.  5. Left  ventricular angiogram in the RAO projection demonstrates mild      global hypokinesis with an ejection fraction estimated at 40-45%.   IMPRESSION:  1. Coronary artery disease status post multiple percutaneous coronary      interventions in the past with a residual 90% ostial stenosis in      the diagonal branch to the left anterior descending that is known      to be old, total occlusion of the first and second marginal      branches of the circumflex artery that is known to be old, and 99%      in-stent restenosis in the mid right coronary artery as well as 70%      stenosis in the distal right coronary artery and a nonstented      segment.  2. Status post successful percutaneous coronary intervention of the in-      stent restenotic lesion in the mid right coronary artery using a      cutting balloon followed by placement of a PROMUS drug-eluting      stent.  3. Successful percutaneous coronary intervention of the distal right      coronary artery and a nonstented segment with placement of a PROMUS      drug-eluting stent.  4. Mild global left ventricular systolic dysfunction that is      unchanged.   RECOMMENDATIONS:  We will continue this patient on aggressive medical  therapy.  I have reviewed his records and do not see that he has been on  a statin therapy in the last year.  I will plan on starting this during  his hospitalization.  The patient will be continued on aspirin and  Plavix for his lifetime.      Verne Carrow, MD  Electronically Signed     CM/MEDQ  D:  07/23/2008  T:  07/23/2008  Job:  [id number]

## 2011-01-13 NOTE — Assessment & Plan Note (Signed)
[facility]                            CARDIOLOGY OFFICE NOTE   NAME:Calcaterra, KIRAN CARLINE                        MRN:          [id number]  DATE:05/10/2007                            DOB:          1951/05/27    PRIMARY CARE PHYSICIAN:  Dr. Sheria Lang with [facility].   REASON FOR VISIT:  Cardiac followup.   HISTORY OF PRESENT ILLNESS:  Mr. Dahmen comes in today having missed his  previous scheduled visit on the 5th of this month.  Symptomatically he  reports that he continues to have dyspnea on exertion at NYHA Class II.  He also has fairly chronic pleuritic chest pain, describing discomfort  in the central portion of his lower anterior rib cage when he takes a  deep breath in.  This past Thursday he experienced a dull ache in his  chest all day and was going to go to the emergency department but  decided not to.  He states that he went to see Dr. Sheria Lang on Friday and  had an electrocardiogram that was reassuring.  The patient states that  it was felt his symptoms were possibly musculoskeletal in etiology.  I  reviewed his medications.  He continues on Coumadin and we plan to check  an INR today.  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  The patient's electrocardiogram today is normal showing sinus  rhythm at 65 beats per minute.   ALLERGIES:  NO KNOWN DRUG ALLERGIES.   PRESENT MEDICATIONS:  1. Coumadin as directed by the Coumadin clinic.  2. Coreg 12.5 mg p.o. b.i.d.  3. Lisinopril 40 mg p.o. daily.  4. Imdur 30 mg p.o. daily.  5. Enteric coated aspirin 325 mg p.o. daily.  6. Prilosec 20 mg p.o. daily.  7. Hydrochlorothiazide 25 mg p.o. daily.  8. Potassium 10 mEq p.o. daily.  9. Percocet p.r.n.  10.Oxycodone p.r.n.   REVIEW OF SYSTEMS:  As described in the history of  present illness.   EXAMINATION:  Blood pressure 115/73, heart rate is 65 and regular,  weight is 238 pounds.  Patient is comfortable and in no acute distress.  HEENT:  Conjunctivae and lids normal, oropharynx is clear.  NECK:  Supple, no elevated jugular venous pressure.  LUNGS:  Clear, somewhat diminished breath sounds.  No obvious pleural  rub noted.  No rales or wheezing.  CARDIAC:  Reveals a regular rate and rhythm, no S3 gallop.  ABDOMEN:  Soft, nontender, normoactive bowel sounds.  EXTREMITIES:  Show no pitting edema, distal pulses are 2+.  SKIN:  Warm and dry.  MUSCULOSKELETAL:  No kyphosis is noted, there is deformity of the right  ankle status post prior surgery.  NEUROPSYCHIATRIC:  Patient is alert and oriented x3.   IMPRESSION/RECOMMENDATION:  1. Pleuritic chest pain as outlined as well as a more prolonged      episode of dull chest aching this past Thursday of uncertain  etiology.  Patient's electrocardiogram is normal.  He is not having      any exertional component to these symptoms.  I would like to      schedule a CT scan of the chest with contrast to followup on his      prior pulmonary emboli and also look for other changes related to      his previous thoracotomy.  I suspect he may have some fibrous      changes/post surgical changes that may be related to his pleuritic      chest pain as his pulmonary emboli should be completely resolved at      this point given long term Coumadin.  His symptoms do not seem to      be ischemic in description, although he does clearly have      underlying coronary artery disease and cardiomyopathy.  We will      followup on his chest CT scan and continue medical therapy for      coronary artery disease.  I will plan to see him back over the next      few months unless he continues to have progressive symptoms.  2. Further plans to follow.     Jonelle Sidle, MD  Electronically Signed    SGM/MedQ  DD: 05/10/2007  DT:  05/11/2007  Job #: [id number]

## 2011-01-13 NOTE — Op Note (Signed)
NAME:  [name], [name]                 ACCOUNT NO.:  [id number]   MEDICAL RECORD NO.:  [id number]          PATIENT TYPE:  AMB   LOCATION:  CARD                         FACILITY:  [facility]   PHYSICIAN:  Coralyn Helling, MD        DATE OF BIRTH:  Jan 12, 1951   DATE OF PROCEDURE:  06/09/2007  DATE OF DISCHARGE:                               OPERATIVE REPORT   BRONCHOSCOPY:   PREOPERATIVE DIAGNOSIS:  Rule out sarcoidosis.   POSTOPERATIVE DIAGNOSIS:  Rule out sarcoidosis   PROCEDURE:  [name] is a 60 year old male who I had recently evaluated  for symptoms of dyspnea.  He was noted to have on CT scan of the chest  from September 11 diffuse increased interstitial markings and multiple  acinar-like and central lobule nodules most notable in the right upper  lobe as well as a nonspecific ground-glass opacification.  As a result  he was scheduled for bronchoscopy with airway inspection,  bronchoalveolar lavage and transbronchial biopsy.  The procedure  explained to the patient.  Risks were detailed including possibilities  of bleeding, infection, pneumothorax and nondiagnosis.  The patient  signed a consent form.  He was given a total of 8 mg of Versed and 100  mcg of fentanyl intravenously for sedation and analgesia.  He was given  a 1% lidocaine nebulizer for topical anesthesia for the nares and  posterior pharynx.  The bronchoscope was then entered through the left  naris.  The vocal cords were visualized and appeared to have normal  motion.  Topical lidocaine was instilled as needed for topical  anesthesia of the airways.  The bronchoscope was entered into the  trachea.  The carina was visualized and the mucosa appeared pale, but  there was no significant widening of the carina.  The bronchoscope was  then entered into the left main bronchus.  The left upper, lingular and  lower lobe orifices were all visualized and there were no obvious  endobronchial lesions.  The bronchoscope was then entered  into the right  main bronchus.  The right upper, middle and lower lobe orifices were all  visualized and there were no obvious endobronchial lesions.  The  bronchoscope was then entered into the right middle lobe and 60 mL of  saline was instilled with approximately 20 mL of clear, foamy fluid  returned.  The bronchoscope was then entered into the right upper lobe.  Using fluoroscopic guidance, a transbronchial biopsy was obtained from  the right upper lobe.  There was a minimal amount of bleeding, which  resolved spontaneously.  Saline was instilled to assure that there was  no further bleeding and then after this, the bronchoscope was then  withdrawn.  The patient tolerated the procedure well without any obvious  immediate complications.  A postprocedure chest x-ray is pending at this  time.  The specimens will be sent for microbiology, cytology and  surgical pathology and then I will call the patient with the results  once they are available and follow up with him the office in  approximately the next 1-2 weeks.  Coralyn Helling, MD  Electronically Signed     VS/MEDQ  D:  06/09/2007  T:  06/09/2007  Job:  [id number]

## 2011-01-16 NOTE — Op Note (Signed)
NAME:  [name], [name]                           ACCOUNT NO.:  [id number]   MEDICAL RECORD NO.:  [id number]                   PATIENT TYPE:  INP   LOCATION:  5035                                 FACILITY:  [facility]   PHYSICIAN:  Sharolyn Douglas, M.D.                     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   PROCEDURES:  1. L5 Gill laminectomy with decompression of the L5 nerve roots bilaterally.  2. L5-S1 posterior arthrodesis.  3. L5-S1 transforaminal lumbar interbody fusion with placement of a 10-mm     NuVasive allograft prosthesis spacer.  4. Pedical screw instrumentation, L5-S1.  5. Local autogenous bone grafting, supplemented with 5 mL ProOsteon bone     graft substitute and platelet gel.  6. Neuro-monitoring utilizing triggered and free-running electromyograms.   SURGEON:  Sharolyn Douglas, M.D.   ASSISTANT:  Verlin Fester, P.A.   ANESTHESIA:  General endotracheal.   COMPLICATIONS:  None.   INDICATIONS:  The patient is a 60 year old male with chronic disabling back  and bilateral lower extremity pain, right greater than left, in an L5  distribution.  He has had a long and appropriate course of conservative  therapy including pain medications, muscle relaxers, physical therapy and  epidural steroid injections.  His plain radiographs show an isthmic  spondylolisthesis at L5-S1 with disk space narrowing.  MRI scan shows multi-  level degenerative changes with the L5-S1 isthmic spondylolisthesis.  There  is severe foraminal stenosis at L5-S1 with bilateral L5 nerve root  impingement.  Because of his disabling symptoms which have failed to respond  to conservative treatment, he has elected to undergo L5-S1 decompression and  fusion in hopes of improving his  symptoms.  He understands he has  significant degenerative changes at the cephalad levels and would be at risk  to develop adjacent segment degeneration and continued pain from these  levels.  Nevertheless, his current symptomatology is intolerable.   PROCEDURE:  The patient was properly identified in the holding area and  taken to the operating room.  He underwent general endotracheal anesthesia  without difficulty.  He was given prophylactic IV antibiotics.  He was  carefully turned prone onto the Acromed four-poster positioning frame.  All  bony prominences were padded.  Face and eyes were protected at all times.  The back was prepped and draped in the usual sterile fashion.  A 10-cm  incision was made over the L5-S1 interspace and extended proximally.  Dissection was carried down to the deep fascia.  Deep fascia was incised and  the paraspinal muscles elevated out over the transverse process of L5 and  the sacral ala bilaterally.  We immediately  noted that the L5 spinous  process and lamina were loose secondary to the spondylolysis.  The L5  transverse process was displaced significantly anteriorly.  We placed our  deep retractors.  We then performed a Gill laminectomy by removing the  spinous process and L5 lamina as a single large piece, once the ligamentum  flavum was released with a curette.  We noted that there was exuberant  fibrous tissue in the pars defect which was narrowing the foramina  bilaterally.  We then performed radical foraminotomies bilaterally,  completely decompressing the L5 nerve roots from their takeoff, all the way  out the foramina.  We noted that the right nerve root was more severely  compressed by the fibrous tissue, hypertrophy of the facet, as well as  pedicle and pedicle narrowing.  Similar findings were on the left side, but  not as severe.  Once the decompression was complete, we performed a  transforaminal lumbar interbody fusion on the right side by  gently  mobilizing the dural sac medially.  Veins were coagulated.  The disk space  was entered using a 15 blade.  We then distracted up to 10 mm.  We then used  specialized instruments from the TLIF set to perform a radical diskectomy  all the way across the contralateral side.  The vertebral endplates were  scraped and the cartilage removed.  We then packed the disk space with local  autologous bone graft that had been mixed with 5 mL of ProOsteon and the  platelet gel.  Once the disk space was packed, we placed a 10-mm NuVasive  allograft prosthesis spacer and carefully tamped it across to the midline  and anterior.  We confirmed good positioning using fluoroscopy.  We checked  the nerve roots again and found that the foramina were now open.  We then  placed pedicle screws at L5 and S1 bilaterally.  We used an anatomic probing  technique as well as fluoroscopy to identify the starting points.  Each  pedicle was palpated using the ball-tipped probe, confirming there were no  breeches.  We were also able to directly palpate the pedicles from within  the canal.  The pedicles were tapped.  We placed 50.0 x 6.5-mm screws at L5  and 40.0 x 7.5-mm screws in the sacrum.  We placed the rods and applied  gentle compression before locking the caps.  The wound was copiously  irrigated.  We then decorticated the L5 transverse process and sacral ala  bilaterally.  We packed the remaining local autogenous bone graft and bone  graft substitute mixture into the lateral gutter, tightly over the L5  process and sacral ala.  We placed a deep Hemovac drain.  We placed Gelfoam  over the exposed dura and nerve roots.  The deep fascia was closed with a  running #1 Vicryl suture, the subcutaneous layer closed with 2-0 Vicryl and  the skin closed with a running 3-0 nylon suture.  It should be noted that  after the pedicle screws were placed, we used trigger EMGs, confirming interosseous placement.  In each case,  we had stimulation at greater than 20  MA.  We also monitored free-running EMGs throughout the three-hour procedure  and there were no deleterious changes.  Patient was turned supine after a  sterile dressing was applied, extubated without difficulty and transferred  to the recovery room in stable condition and able to move his upper and  lower extremities.  Sharolyn Douglas, M.D.    MC/MEDQ  D:  12/28/2002  T:  12/29/2002  Job:  [id number]

## 2011-01-16 NOTE — Cardiovascular Report (Signed)
NAME:  [name], [name]                 ACCOUNT NO.:  [id number]   MEDICAL RECORD NO.:  [id number]          PATIENT TYPE:  INP   LOCATION:  3304                         FACILITY:  [facility]   PHYSICIAN:  Arvilla Meres, M.D. [facility] OF BIRTH:  02/12/1951   DATE OF PROCEDURE:  06/16/2005  DATE OF DISCHARGE:                              CARDIAC CATHETERIZATION   PRIMARY CARE PHYSICIAN:  [facility]   REFERRING PHYSICIAN:  Dr. Sherlyn Lick   IDENTIFICATION:  Mr. Lorimer is a 60 year old male with a history of coronary  artery disease status post a previous inferior wall myocardial infarction.  He has undergone previous stenting on two occasions to the right coronary  artery in 2002 and 2003 in [facility].  Over the past several  months he has had exertional angina which he has not mentioned to anyone.  More recently he underwent repair of a fracture in his right foot.  In the  postoperative period he had a recurrent chest pain similar to his previous  angina.  He underwent Cardiolite which showed an EF of 31% with some mild  inferior lateral wall ischemia.  He had ruled out for myocardial infarction  and is now transferred for cardiac catheterization.  Of note, he does have a  history of medical noncompliance and despite his coronary disease, does not  have a family doctor or a cardiologist with whom he follows.   PROCEDURES PERFORMED:  1.  Selective coronary angiography.  2.  Left heart catheterization.  3.  Left ventriculogram.   DESCRIPTION OF THE PROCEDURE:  The risks and benefits of catheterization  were explained to Mr. Minter.  Consent was signed and placed on the chart.  A  6-French arterial sheath was placed in the right femoral artery using a  modified Seldinger technique.  Standard catheters including a JL4, JR4, and  angled pigtail were used for the procedure.  All catheter exchanges made  over a wire. There were no apparent complications.   Central aortic  pressure was 141/81 with a mean of 106.  LV pressure was  160/80 with an EDP of 14.  There was no gradient across the aortic valve on  pullback.   Left main was short, angiographically normal.   LAD was a long vessel wrapping the apex.  It gave off several small  diagonals.  In the proximal portion of the LAD there was a minor  irregularity.  In the mid portion between the two diagonals there was a 30%  a tubular stenosis.  In the mid to distal portion there was a 50-60% tubular  stenosis.  The first diagonal was a small to moderate sized vessel with a  99% ostial lesion.   Left circumflex was a large system essentially codominant with a small PDA.  It gave off a large OM1 and a large OM2.  The distal AV groove circumflex  was small to moderate size.  In the proximal portion of the left circumflex  was a 30-40% tubular lesion.  In the mid portion of the circumflex there was  a 50-60% lesion extending  into the ostomy of the OM2.  The OM1 was totally  occluded at the ostium.  There was evidence of retrograde filling from left-  to-left collaterals from the OM2 as well as the distal LAD.   Right coronary artery was large, dominant vessel.  There was a tiny acute  marginal branch and moderate sized PDA.  In the mid portion of the RCA there  was evidence of previously placed stents.  There was also evidence of a  stent in the distal portion just at distal RCA into the ostium of the PDA.  In the proximal portion of the RCA was a 50-60% stenosis.  In the proximal  to mid portion there is a 50-60% focal stenosis just at the lead-in to the  proximal stent.  The stent itself was widely patent.  After the stent there  was a 30% stenosis.  In the distal RCA a little bit prior to the takeoff of  the PDA there was a 99% focal stenosis.   Left ventriculogram done in the RAO approach showed an EF of approximately  35-40%.  There was inferior and inferior apical akinesis.   ASSESSMENT:  Three-vessel  coronary artery disease as described above.  The  OM2 is chronically occluded, but fills well through left-to-left  collaterals.  There is also a high-grade lesion in the ostium of the first  diagonal but this is a small vessel and is best treated medically.  Appears  that the culprit vessel is most likely the distal right coronary artery  lesion.  There is also evidence of a moderate decrease in left ventricular  function.   </PLAN/DISCUSSION>  I have reviewed the catheterization films with Dr. Riley Kill.  He agrees that  the best approach is likely PTCA and stent of the distal RCA.  I did have a  long discussion with Mr. Hallahan given the risks and benefits of bare metal  stenting versus drug-eluting stent.  Given his history of noncompliance, at  this point we have decided to proceed with bare metal stenting.      Arvilla Meres, M.D. [facility]  Electronically Signed     DB/MEDQ  D:  06/16/2005  T:  06/16/2005  Job:  [id number]   cc:   [facility] [place]   Harl Bowie, M.D.  Fax: [phone]

## 2011-01-16 NOTE — Op Note (Signed)
NAME:  Koloski, Tenzin                 ACCOUNT NO.:  [id number]   MEDICAL RECORD NO.:  [id number]          PATIENT TYPE:  INP   LOCATION:  2314                         FACILITY:  [facility]   PHYSICIAN:  Kerin Perna, M.D.  DATE OF BIRTH:  August 04, 1951   DATE OF PROCEDURE:  06/21/2006  DATE OF DISCHARGE:                                 OPERATIVE REPORT   OPERATION:  Left chest tube placement.   PREOPERATIVE DIAGNOSIS:  Left hemothorax following prior thoracotomy for  lumbar spine exposure.   POSTOPERATIVE DIAGNOSIS:  Left hemothorax following prior thoracotomy for  lumbar spine exposure.   SURGEON:  Kerin Perna, M.D.   ANESTHESIA:  Local 1% lidocaine, with intravenous conscious sedation using  morphine and Versed.   PROCEDURE:  The patient is a 60 year old male who is 1 week status post left  thoracotomy for exposure of lower thoracic/upper lumbar diskectomy and  instrumentation.  He developed shortness of breath, and a chest x-ray showed  a new large left pleural effusion since his x-ray 48 hours ago.  He had been  hemodynamically stable.  His oxygen requirement had increased.  A left chest  tube was felt to be indicated to drain this new effusion.  I discussed the  procedure with the patient and his wife of before the procedure and obtained  informed consent.   PROCEDURE:  The left chest was prepped and draped as a sterile field.  1%  lidocaine was infiltrated in the left inframammary crease.  A small incision  was made.  Further infiltration of lidocaine down to the chest wall was  accomplished.  The left chest was entered in the fifth interspace, and a 28-  French chest tube was placed over a trocar and positioned to the apex and  secured to the skin with silk sutures.  A sterile dressing was applied.  1000 cc of bloody fluid returned.  A portable chest x-ray is pending.      Kerin Perna, M.D.  Electronically Signed     PV/MEDQ  D:  06/21/2006  T:  06/21/2006  Job:   [id number]   cc:   [facility]  Dalia Heading, M.D.

## 2011-01-16 NOTE — Consult Note (Signed)
NAME:  [name], [name]                 ACCOUNT NO.:  [id number]   MEDICAL RECORD NO.:  [id number]          PATIENT TYPE:  INP   LOCATION:  3302                         FACILITY:  [facility]   PHYSICIAN:  Jonelle Sidle, MD DATE OF BIRTH:  Aug 31, 1951   DATE OF CONSULTATION:  DATE OF DISCHARGE:  06/13/2006                                   CONSULTATION   REFERRING PHYSICIAN:  Dr. Tressie Stalker   REASON FOR CONSULTATION:  Preoperative evaluation.   HISTORY OF PRESENT ILLNESS:  [name] is a 60 year old male previously  followed by Dr. Sherlyn Lick in [place]. He has known coronary artery disease with  ischemic cardiomyopathy and a left ventricular ejection fraction of  approximately 35-40%. His last cardiac catheterization was in December,  2006, at which time he was noted to have overall nonobstructive coronary  artery disease except for a high-grade stenosis within the distal right  coronary artery which was ultimately treated with placement of a bare metal  stent. His prior history includes an inferior wall myocardial infarction  with a previously placed stent at that time as well. He has not had any  regular followup of his cardiac disease stating that he has no insurance. He  does state that he has been taking medications, predominantly generics that  he has been able to obtain through the [employer] drug assistance program. In  terms of symptoms, he fortunately has not had any problems with significant  angina or limiting dyspnea on exertion. He states that he typically has been  fairly active and in fact has recently been lifting heavy weights at work  without any exacerbation of chest pain or shortness of breath. He has  furthermore had no problems with palpitations, orthopnea, PND, lower  extremity edema or syncope.   He states that approximately 5-6 weeks ago he lifted a very heavy weight  and subsequently noted some discomfort off and on in his right leg with some  weakness.  Approximately 1 week ago he stated that he lifted some house  siding (approximately 150 pounds) and then began to experience problems with  bilateral leg weakness, shock-like discomfort, and has had to use crutches  to walk since then. He is scheduled tentatively for a thoracotomy tomorrow  for access to his thoracic spine in anticipation of a T11 corpectomy and T10-  T12 fusion. There are concerns about the patient's lower extremity weakness  and neuropathy and therefore some urgency to proceeding with this surgery.   Electrocardiogram today shows sinus rhythm at 68 beats per minute with a QT  interval of 467 ms. There are no marked ST-T wave changes in comparison to  prior tracings from 2006. His telemetry monitoring shows sinus rhythm  although with 2 separate episodes of nonsustained ventricular tachycardia  which were asymptomatic.   ALLERGIES:  No known drug allergies.   HOME MEDICATIONS:  1. Piroxicam 20 mg p.o. daily.  2. Diclofenac 75 mg p.o., b.i.d.  3. Cyclobenzaprine as directed.  4. Lovastatin 40 mg p.o. daily.  5. Hydrochlorothiazide 12.5 mg p.o. daily.  6. Lisinopril 10 mg p.o. daily.  7. Percocet p.r.n.   PAST MEDICAL HISTORY:  1. As outlined in the history of present illness. Additional problems      include:  2. Hypertension.  3. Hyperlipidemia.  4. Degenerative joint disease with osteoarthritis.  5. Prior history of bilateral total knee replacements.  6. Spondylolisthesis of L5 and S2, status post previous L1-S1 fusion in      July, 2004.  7. Prior history of Methicillin-sensitive Staphylococcus aureus sepsis.   SOCIAL HISTORY:  The patient lives in [place]. 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 There is no active tobacco or alcohol  use. He is married.   FAMILY HISTORY:  Significant for coronary artery disease in the patient's  mother, occurring in her 44s. The patient's father died in his late 10s  after multiple heart attacks  and congestive heart failure. He has 1  brother and 2 sisters with diabetes and heart trouble.   REVIEW OF SYSTEMS:  As described in history of present illness. He denies  having any limiting exertional chest pain or dyspnea. He has had no  palpitations or syncope. He denies any bleeding problems, melena,  hematochezia, cough, fevers or chills. Main complaints recently have been  lower extremity weakness, shock-like pain and numbness.   PHYSICAL EXAMINATION:  VITAL SIGNS:  Blood pressure is 136/87. Heart rate is  in the 80s and sinus rhythm. He is afebrile. Respirations 20 and nonlabored.  GENERAL:  This is an overweight male in no acute distress.  HEENT:  Conjunctivae and lids are normal. Oropharynx is clear.  NECK:  Supple without elevated jugular venous pressure. No thyromegaly or  thyroid tenderness is noted.  LUNGS:  Clear without labored breathing.  CARDIAC:  Exam reveals a regular rate and rhythm with soft systolic murmur  heard throughout the precordium. The 2nd heart sound is normal. There is no  obvious S3 gallop or pericardial rub.  ABDOMEN:  Soft. No midline bruit or tenderness. Bowel sounds are present. No  obvious hepatomegaly.  EXTREMITIES:  Have compression devices in place. No pitting edema is noted.  Distal pulses are 2+.  SKIN:  No ulcerative changes noted.  MUSCULOSKELETAL:  No kyphosis is noted.  NEUROPSYCHIATRIC:  Patient is alert and oriented x3.   LABORATORY DATA:  WBCs 9.3, hemoglobin 13.1, hematocrit 39.2, platelets 193.  INR 1.1. Sodium 134, potassium 4.2, BUN 17, creatinine 0.8. Liver function  tests are normal. Urinalysis is normal.   Chest x-ray reports low lung volumes with bibasilar atelectasis.   IMPRESSION:  1. Preoperative evaluation in a 60 year old male with known coronary      artery disease status post remote inferior wall myocardial infarction     with stent placement to the right coronary artery and a fairly recent      intervention to the  right coronary artery in October, 2006, with      placement of a bare metal stent at that time. Ejection fraction      previously assessed in the 35-40% range. He has not had regular      cardiology followup since that time but remains symptomatically stable,      denying any significant exertional angina or dyspnea with fairly      substantial description of activity (lifting 150 pounds). The      electrocardiogram shows no marked ST-T wave changes in comparison to      prior tracings.  2. History of hypertension and hyperlipidemia.  3. Plans for thoracotomy with T11 corpectomy  and T10-T12 fusion. The      patient has bilateral lower extremity neuropathy, apparently fairly      acute over the last week, and there is some urgency to proceeding with      his surgery.   DISCUSSION:  I reviewed the issues with the patient. He has been  revascularized within the year and is fortunately reporting stable  symptomatology. He has an ejection fraction previously assessed in the 35-  40% range and has had some nonsustained ventricular tachycardia  (asymptomatic) noted on monitoring. At the present time he is not on beta  blocker therapy. I suspect that his perioperative cardiovascular risk is at  least moderate based on his substrate and condition although does not  preclude him from proceeding, particularly given the described urgency. I  will plan to initiate Coreg and titrate this as tolerated while continuing  his lisinopril and lovastatin. Will discontinue hydrochlorothiazide and  initiate Imdur. Will plan serial electrocardiograms and at least initially  will assess a baseline set of cardiac markers. Fasting lipid profile will  also be obtained. We will follow up with a 2-D echocardiogram to reassess  cardiac structure and function and plan to follow him through his  perioperative course. Further plans to follow.      Jonelle Sidle, MD  Electronically Signed     SGM/MEDQ  D:   06/13/2006  T:  06/13/2006  Job:  [id number]   cc:   Cristi Loron, M.D.

## 2011-01-16 NOTE — H&P (Signed)
NAME:  [name], [name]                           ACCOUNT NO.:  [id number]   MEDICAL RECORD NO.:  [id number]                   PATIENT TYPE:  INP   LOCATION:  2864                                 FACILITY:  [facility]   PHYSICIAN:  Sharolyn Douglas, M.D.                     DATE OF BIRTH:  03-03-1951   DATE OF ADMISSION:  12/28/2002  DATE OF DISCHARGE:                                HISTORY & PHYSICAL   CHIEF COMPLAINT:  Back and right lower extremity pain.   HISTORY OF PRESENT ILLNESS:  This is a 60 year old male who has been having  back and right lower extremity pain, referred to [place] by Dr. Bevely Palmer for  surgical consultation.  The pain has been going on for several years now, it  got to the point that it is constant, aching and burning across his low back  with radiation into the right lower extremity, including the posterolateral  thigh, calf and foot.  He describes the pain as a 9/10 on a visual analog  scale.  It is worse with any activity including sitting or standing.  The  most comfortable position is lying down with his legs up.  He has tried  numerous methods of conservative management including anti-inflammatory  medications, pain medications, activity modification, epidural steroid  injections, physical therapy, without any significant relief.  Risks and  benefits of the above surgery were discussed with the patient by Dr. Noel Gerold,  as well as myself, he indicated understanding and opted to proceed.   ALLERGIES:  No known drug allergies.   MEDICATIONS:  Vicodin p.r.n.   PAST MEDICAL HISTORY:  1. Hypertension.  2. Coronary artery disease.  3. Osteoarthritis.   PAST SURGICAL HISTORY:  1. Heart catheterization with two stents.  2. Total knee replacement x2.  3. Right rotator cuff repair.   SOCIAL HISTORY:  The patient denies tobacco use, denies alcohol use.  He is  married.  He works in his own business in Music therapist, and  also he is a Optician, dispensing.  He has a family and  friends to help him through his  postoperative course.   FAMILY MEDICAL HISTORY:  Significant for coronary artery disease,  hypertension, diabetes and osteoarthritis.   REVIEW OF SYSTEMS:  The patient denies any fevers, chills, sweats, bleeding  tendencies.  CENTRAL NERVOUS SYSTEM:  Denies blurred vision, double vision,  seizures, headaches, and paralysis.  CARDIOVASCULAR:  Denies chest pain,  angina, orthopnea, claudication, palpitations.  PULMONARY:  He denies  shortness of breath, productive cough, hemoptysis.  GI:  Denies nausea,  vomiting, constipation, diarrhea, melena or bloody stool.  GU:  Denies  dysuria, hematuria, discharge.  MUSCULOSKELETAL:  As per HPI.   PHYSICAL EXAMINATION:  VITAL SIGNS:  Blood pressure 146/86, respirations 16  unlabored, pulse 84 and regular.  GENERAL APPEARANCE:  This is a 60 year old white male who is alert  and  oriented, in no acute distress.  He is well-nourished.  Appears stated age.  He is pleasant and cooperative through the examination.  HEENT:  Head is normocephalic and atraumatic.  Pupils equal, round and  reactive.  Extraocular movements intact.  Nose patent.  Pharynx clear.  NECK:  Soft to palpation, no bruits appreciated, no lymphadenopathy,  thyromegaly noted.  CHEST:  Clear to auscultation bilaterally.  No rubs, rhonchi, stridor,  wheeze or friction rub.  BREASTS:  Not pertinent and not performed.  HEART:  S1, S2 regular rate and rhythm, no murmurs, gallops or rubs noted.  ABDOMEN:  Soft to palpation, nontender, nondistended, no organomegaly noted,  positive bowel sounds throughout.  GU:  No pertinent.  EXTREMITIES:  Right lower extremity pain.  Sensation is intact, grossly.  Motor function is grossly intact.  Dorsalis pedis and posterior tibialis  pulses are intact bilaterally.  SKIN:  Intact without lesions or rashes.   X-RAYS:  X-ray shows L5-S1 spondylolisthesis which is graded 1-2 with severe  disk space narrowing and  degenerative changes.  There is slight motion in  flexion and extension films.   IMPRESSION:  1. Spondylolisthesis at L5-S1 with right lower extremity radiculopathy.  2. Hypertension.  3. Coronary artery disease.  4. Osteoarthritis.   PLAN:  1. Admit to [facility] on 12/28/02 for laminectomy at L5-S1 with     posterior spinal fusion and __________  procedure at that level as well.     This will all be done by Dr. Sharolyn Douglas.  2. The patient is seeing his primary care physician Dr. Lajoyce Lauber for     preoperative clearance and exam on 12/20/02.  Should a medical     complication or questions arise in that  perioperative period, we will     certainly contact his internist.     Verlin Fester, P.A.                       Sharolyn Douglas, M.D.    CM/MEDQ  D:  12/28/2002  T:  12/28/2002  Job:  [id number]

## 2011-01-16 NOTE — Discharge Summary (Signed)
NAME:  [name], [name]                           ACCOUNT NO.:  [id number]   MEDICAL RECORD NO.:  [id number]                   PATIENT TYPE:  INP   LOCATION:  5035                                 FACILITY:  [facility]   PHYSICIAN:  Sharolyn Douglas, M.D.                     DATE OF BIRTH:  [birth year]   DATE OF ADMISSION:  12/28/2002  DATE OF DISCHARGE:  01/01/2003                                 DISCHARGE SUMMARY   ADMISSION DIAGNOSES:  1. Spondylolisthesis, L5-S1, with right lower extremity radiculopathy.  2. Hypertension.  3. Coronary artery disease.  4. Osteoarthritis.   DISCHARGE DIAGNOSES:  1. Status post L5-S1 spinal fusion, doing well.  2. Postoperative hemorrhagic anemia that was mild, asymptomatic, and did not     require a blood transfusion.  3. Low-grade temperature postoperatively that resolved prior to discharge.  4. Hypertension.  5. Coronary artery disease.  6. Osteoarthritis.   PROCEDURES:  L5-S1 Gill laminectomy and posterior spinal fusion with pedicle  screws done by Sharolyn Douglas, M.D., assistant [facility], [facility].  Anesthesia  used was general.   CONSULTS:  None.   LABORATORY DATA:  CBC with diff was normal on admission; H&H was monitored  due to him postoperatively reaching a level of 11.1 and 33.6, respectively;  he was asymptomatic and did not require any transfusion.  PT, INR, and PTT  were normal on admission.  Complete metabolic panel normal on admission with  the exception of glucose slightly elevated at 119, AST at 42, and ALT at 49;  postoperatively, BMET was rechecked, CO2 was 33, glucose 122, otherwise  normal.  UA on admission was negative.  Postoperatively on December 26, 2002,  blood type A, Rh titer positive, antibody screen negative.  X-rays from Jan 01, 2003, of the lumbar spine, two views, showed stable fusion at L5-S1.  No  ECG was seen on the chart.   HOSPITAL COURSE:  Following admission on December 28, 2002, patient was taken  to the operating room  for the above-listed procedure and tolerated the  procedure well without any interoperative complications.  Blood loss was  approximately 300 mL; one Hemovac drain was placed.  He was transferred to  the recovery room in stable condition.   Postoperatively, neurovascular status was intact, strength was __________  bilateral lower extremities, sensation was intact.  Regular postop protocol  was followed for lumbar fusion, which does include routine vital signs and  neurovascular checks as well as holding diet until he is passing flatus, at  which time it is slowly advanced to a regular diet; he had no complications  with this.  Incentive spirometer was used q.1h. while awake.  Hemovac drain  was kept intact for 48 hours, at which time it was pulled without any  difficulty.  His dressing was reinforced as needed.  His Foley was  discontinued on postoperative day  two, in the morning.  TED hose and SCDs  were utilized to decrease the chance of any DVTs.  Routine lab work and  lumbar x-ray were done prior to discharge with the results as previously  dictated.  Physical therapy and occupational therapy worked with the patient  on a daily basis, working on brace care and management as well as a  progressive ambulation program with back precautions; he did well with them,  progressing along as expected.  EBI LSO was given to the patient  preoperatively, and he brought that with him and used that throughout his  hospital stay.  Forty-eight hours of prophylactic antibiotics were used and  controlled his pain using a combination of IV pain medications as well as  p.o. medications.  He was transitioned to strictly p.o. pain control on day  two, and he continued to do well.  Care management was contacted to assist  with any home needs, as recommended per physical therapy and occupational  therapy.   By Jan 01, 2003, the patient had met all orthopedic goals, was medically  stable and ready for  discharge to his home; he was eager to be discharged.  Vital signs were stable; his temperature was 100.5.  Motor and sensation  were intact.  Neurovascularly intact.  Incision looked good, had a minimal  amount of serous drainage; no signs or symptoms of infection.   PLAN:  A 59 year old male status post L5-S1 posterior spinal fusion, ready  for discharge.   ACTIVITY:  1. Progressive ambulation.  2. Brace on when he is up.  3. No lifting greater than 5 pounds.  4. Back precautions were discussed and understood.  5. Patient was instructed that he may shower when the drainage has stopped.  6. Dressing change on a daily basis.  7. Incentive spirometer q.1h.   FOLLOW UP:  Follow up in 10 to 14 days postop; call for an appointment.   DISCHARGE MEDICATIONS:  1. Percocet.  2. Robaxin.  3. Over-the-counter laxative as needed.  4. Tylenol as needed for temp.  5. Multivitamin.  6. Calcium with vitamin D.   DIET:  As tolerated, regular home diet.   CONDITION ON DISCHARGE:  Stable and improved.   DISPOSITION:  The patient is being discharged to his home with home health  physical therapy for home safety eval, gait training as needed.     Verlin Fester, P.A.                       Sharolyn Douglas, M.D.    CM/MEDQ  D:  03/08/2003  T:  03/09/2003  Job:  [id number]

## 2011-01-16 NOTE — H&P (Signed)
NAME:  [name], [name]                 ACCOUNT NO.:  [id number]   MEDICAL RECORD NO.:  [id number]          PATIENT TYPE:  INP   LOCATION:  1827                         FACILITY:  [facility]   PHYSICIAN:  Audery Amel, MD    DATE OF BIRTH:  [birth year]   DATE OF ADMISSION:  07/20/2006  DATE OF DISCHARGE:                                HISTORY & PHYSICAL   For [facility].   PRIMARY CARDIOLOGIST:  Dr. Diona Browner.   CHIEF COMPLAINT:  Shortness of breath and chest pain.   HISTORY OF PRESENT ILLNESS:  The patient is a 60 year old, white male with a  past medical history notable for coronary artery disease who presented to  [facility] Emergency Department for further evaluation of chest pain.  Of  note, the patient recently had a very complicated hospital course after his  T10 through T12 spinal fusion.  He was initially admitted in mid October for  the procedure; however, he developed a left hemothorax afterwards which  required chest tube drainage and extended hospital stay.  He subsequently  developed a DVT and bilateral pulmonary embolisms during that  hospitalization, requiring placement of an IVC filter.  He was initiated on  anticoagulation prior to discharge with a therapeutic INR.  The patient  states that his pain started this evening while at rest.  He characterizes  the pain as very sharp and right-sided with radiation to his right shoulder.  The pain is worse with deep inspiration.  There is no palpable component.  The patient denies any hemoptysis or productive cough.  He denies any PND or  orthopnea, and his current O2 saturation is 100% on 2L O2 via nasal cannula.  The patient states that he has been up and moving about, but continues to be  deconditioned with reduced functional after a prolonged hospitalization.  The initial impression of his presentation at [facility] was that of acute  coronary syndrome.  An EKG was obtained which did reveal T-wave inversions.  In  discussion with the emergency physician at [facility], he was accepted  under the premise of possible acute coronary syndrome.   After our initial discussion, a CT scan was performed.  It revealed  bilateral pulmonary embolisms.  The patient arrived to [facility] ER in  stable condition.  There was a CT scan report accompanying the patient,  confirming the findings of bilateral pulmonary embolisms, consistent with  his previous hospitalization.  Upon review of his EKG, the T-wave inversion  is actually located in the right side of the precordial leads which likely  represents right ventricular strain pattern.  Currently, he is  hemodynamically stable, and the chest pain has improved significantly since  receiving morphine.  Otherwise, he is has no other complaints.   PAST MEDICAL HISTORY:  1. Coronary artery disease status post multiple percutaneous coronary      interventions.  His EF is 35%.  He last had a bare metal stent placed      December 2006.  2. Hypertension.  3. Hyperlipidemia.  4. Degenerative joint disease and osteoarthritis.  5. Status post bilateral  total knee replacements.  6. Status post L5-S1 fusion, July 2004.  7. History of MRSA.   ALLERGIES:  No known drug allergies.   CURRENT MEDICATIONS:  Unfortunately, the patient is unaware of his list of  medications.  On reviewing our electronic record, there is no discharge  summary available for review.  His medication list will need to be confirmed  with his wife.   SOCIAL HISTORY:  The patient lives in [place] with his wife.  He has his  own personal business installing vinyl siding.  He does have a remote  history of tobacco but quit several years ago.  He denies any alcohol or  illicit substances.   FAMILY HISTORY:  Notable for coronary artery disease in mother in her 77s.  Father died of a MI in his late 7s.  He has 1 brother and 2 siblings, 1 of  which has diabetes and coronary disease in the setting of  morbid obesity.   REVIEW OF SYSTEMS:  CONSTITUTIONAL:  No fevers, chills, swats, or  adenopathy.  HEENT:  No headache, sore throat, nasal discharge, change in  visual or auditory acuity.  SKIN:  No rash or lesion.  The patient does have  a wound of the left lateral chest wall which is healing.  CARDIOPULMONARY:  As per HPI.  GU:  No frequency, urgency or dysuria.  NEURO/PSYCHE: No  weakness, numbness or mood disturbance.  MUSCULOSKELETAL:  No myalgias or  arthralgias.  GI:  No nausea, vomiting, diarrhea, bright red blood per  rectum, melena, hematemesis or dysphagia.  ENDOCRINE:  No polyuria, no  polydipsia, no heat or cold intolerance.   PHYSICAL EXAMINATION:  Blood pressure is 133/90.  Pulse was 73.  O2 sats 98%  on 2 L nasal cannula.  Weight was approximately 190 pounds.  GENERAL:  The patient is well-developed, well-nourished, pleasant, in no  acute distress.  HEENT:  Normocephalic, atraumatic.  EOMI, PERL.  Nares patent, oropharynx  clear without erythema or exudate.  NECK:  Supple, full range of motion, no JVD.  Carotid upstrokes are equal  and symmetric bilaterally without audible bruit.  There is no palpable  lymphadenopathy or thyromegaly.  HEART:  Normal S1, S2 without audible murmurs or gallops.  PMI is  nondisplaced in left clavicular line.  Peripheral pulses are 2+ and  symmetric bilaterally.  LUNGS:  Revealed diminished breath sounds at the bilateral bases, left  greater than the right.  There are scattered crackles appreciated.  Occasional expiratory wheezing.  SKIN:  No rashes or lesions.  ABDOMEN:  Soft, nontender, nondistended with positive bowel sounds.  No  hepatosplenomegaly.  GU:  Normal male genitalia.  EXTREMITIES:  Bilateral 1+ lower extremity edema.  The right lower extremity  has chronic _________ changes from a crush injury.  There are bilateral  total knee replacement scars. MUSCULOSKELETAL:  No joint deformities or effusions.  NEUROLOGIC:  Alert and  oriented x3.  Cranial nerves II through XII are  grossly intact, otherwise nonfocal.   Chest x-ray revealed a left pleural effusion with diminished lung volumes  bilaterally.  CT notable for bilateral PEs.  Please see accompanying  support.  EKG at outside hospital reveals normal sinus rhythm with T-wave  inversion in the right precordial leads consistent with right ventricular  strain.   Labs from outside hospital, white count 9.3, hematocrit 36, platelet count  415, sodium 136, potassium 4, chloride 103, CO2 is 22, BUN 8, creatinine  0.5, glucose 117.  Troponin is 0.02.  BNP is 500.  PT/INR is 4.1.   IMPRESSION:  1. Bilateral pulmonary embolus.  2. Coronary artery disease.  3. Hypertension.  4. Hyperlipidemia.  5. Status post thoracotomy for T10 to T12 fusion.  6. History of left pneumothorax requiring chest tube drainage.  7. Deep venous thrombosis.  8. History of methicillin-resistant Staphylococcus aureus.   PLAN:  CT scan from the outside hospital shows presence of bilateral  pulmonary embolisms.  CT scan during his most recent hospitalization showed  a similar result, and it is unlikely that there has been interval change.  However, we will need to have our radiologist review these studies.  The  patient was initiated appropriately on anticoagulation during his  hospitalization and was therapeutic at the time of discharge.  However,  unfortunately, on presentation his INR is 1.1.  The patient was treated with  enoxaparin  subcutaneously at the outside hospital.  I would prefer an  unfractionated heparin drip; however, given that he has already received low-  molecular weight heparin,  I will continue with his therapy and there is an  increase risk of bleeding with switching between various forms of heparin.  The patient does have a history of a bare metal stent, and believes that he  takes Plavix 75 mg once daily. I will continue with this therapy, in  addition, aspirin 81  mg once daily for his coronary artery disease.  We will  need to check with his wife to confirm the rest of his cardiovascular  medications, however, in the interval will continue him on Lisinopril 10 mg  once daily, hydrochlorothiazide 25 mg once daily, and atorvastatin 40 mg  once daily, as per previous dictated consultation note.  Plan is to send the  patient to a step down bed for close observation and monitoring.      Audery Amel, MD  Electronically Signed     SHG/MEDQ  D:  07/20/2006  T:  07/20/2006  Job:  [id number]

## 2011-01-16 NOTE — Consult Note (Signed)
NAME:  [name], [name]                 ACCOUNT NO.:  [id number]   MEDICAL RECORD NO.:  [id number]          PATIENT TYPE:  INP   LOCATION:  1828                         FACILITY:  [facility]   PHYSICIAN:  Cristi Loron, M.D.DATE OF BIRTH:  1950-09-25   DATE OF CONSULTATION:  06/12/2006  DATE OF DISCHARGE:                                   CONSULTATION   CHIEF COMPLAINT:  Back pain.   HISTORY OF PRESENT ILLNESS:  The patient is a 60 year old white male who has  had chronic trouble with his back.  He has undergone an L5-S1 fusion,  performed by Dr. Noel Gerold, back in approximately April 2004.  The surgery  seemed to help somewhat, but the patient has had chronic intermittent back  troubles with intermittent severe flare ups. Things worsened about 6 weeks  ago when he began having increasing back pain with pain and numbness into  his right thigh.  He basically lived with it and it took a turn for the  worse about 6 days ago, when he was lifting some of vinyl siding.  He had  increased pain in his low back at the lumbosacral junction.  He saw a doctor  in [place], he cannot remember his name, but he was treated with  medications including pain pills.  The patient then traveled to  [place] for work and the pain caused him to go to the ER.  He was seen  in the ER approximately 4 days ago, and then 3 days ago he was treated and  released.  He was treated with medications including cortisone ejections,  pain shots, etc.  At that time, the patient noted that his bilateral lower  extremities were numb and he felt electric shocks.  This has not improved  and therefore, the patient came to the [facility] emergency department.  The  patient was evaluated by Dr. Doug Sou include a lumbar MRI.  The  lumbar MRI demonstrated a large herniated disk at T11-T12, and Dr.  Ethelda Chick called Dr. Simonne Come but  Dr. Simonne Come recommended a  neurosurgical consultation.   Presently, the patient complains  of back pain at the lumbosacral junction  and bilateral lower extremity numbness.  He admits to one episode of urinary  incontinence, when he simply could get to the bathroom in time.  He has had  no recurrent episodes.  He has had no occasion to have an erection but does  notice a perineal numbness.  He states that his symptoms have not worsened,  and in fact have been stable over the last 4 days.  They have not improved  nor worsened.   PAST MEDICAL HISTORY:  1. Hypertension.  2. Coronary artery disease.  3. The patient has had a catheterization and stenting at [facility]      by Dr. Eula Fried, question spelling.  The patient subsequently had      another episode of chest pain after he had surgery in [place], was      transported to [facility], and was treated by Dr. Sherryl Manges.  He again underwent angioplasty and stenting.  Most recent study      was back in October2006.  4. Episode of the staph aureus septicemia.  5. Hyperlipidemia.  6. Osteoarthritis of the knee.  7. Right foot injury.  8. Carpal tunnel syndrome.   PAST SURGICAL HISTORY:  1. Lumbar fusion as above by Dr. Noel Gerold.  2. He has had five knee surgeries including bilateral total knee      replacements, by Dr. Bevely Palmer.  3. The patient had bilateral carpal tunnel release.  4. He has had 4 right foot surgeries as above.  He has his last foot      surgery about a year ago.  He developed chest pain afterwards and      subsequently was transferred to [facility].   MEDICATIONS:  Prior to admission are:  1. Piroxicam 20 mg p.o. daily.  2. Diclofenac 75 mg p.o. b.i.d.  3. Cyclobenzaprine 10 mg p.o. t.i.d.  4. Lovastatin 40 mg p.o. daily.  5. Hydrochlorothiazide 12.5 mg p.o. daily.  6. Lisinopril 10 mg p.o. daily.   THE PATIENT HAS NO KNOWN DRUG ALLERGIES.   FAMILY HISTORY:  The patient's mother's is 28.  She has diabetes mellitus,  coronary disease.  The patient's father died in 67, of  black lung.   SOCIAL HISTORY:  The patient is married.  He has three children.  He is a  Chiropractor.  He denies tobacco use.  He quit about 20 years  ago.  He denies ethanol and drug use.  He lives in [place].   REVIEW OF SYSTEMS:  Negative as above and denies chest pain chest.   PHYSICAL EXAMINATION:  GENERAL:  A pleasant 60 year old white male complains  of back pain.  VITAL SIGNS:  Blood pressure is 144/92, temperature 97.6 degrees Fahrenheit  orally, heart rate 76, respiratory rate 20, oxygen saturation 92% on room  air.  HEENT:  Normocephalic, atraumatic.  Pupils equal, round, react to light.  Extraocular movements intact.  Oropharynx benign.  He is wearing dentures.  NECK:  Supple.  There are no masses, deformities, tracheal deviation.  He  has limited cervical range of motion.  Spurling's testing is negative.  Lhermitte's sign was not present.  Thorax is symmetric.  LUNGS:  Clear to auscultation.  HEART:  Regular rhythm.  ABDOMEN:  Soft, nontender.  EXTREMITIES:  The patient has a fused right ankle, a surgically fused right  ankle.  He has well-healed carpal tunnel incisions, well healed bilateral  knee incisions.  BACK:  The patient complains of pain at the lumbosacral junction.  He denies  pain at the thoracolumbar junction.  He has a well healed lumbar incision.  No deformities or point tenderness.  NEUROLOGIC:  The patient is alert and oriented x3.  Cranial nerves II-XII  are examined bilaterally and grossly normal.  Vision and hearing were  grossly normal bilaterally.  Motor strength is 5/5 in his bilateral deltoid,  biceps, triceps, hand grip, quadriceps, gastrocnemius, extensors longus.  He  gives away somewhat in his bleeding psoas.  Sensory exam demonstrates  decreased light sensation from the umbilicus down, including the perineal  region.  Deep tendon reflexes are 2/4 in his bilateral biceps, triceps, quadriceps, and left gastrocnemius.  There is no  right gastrocnemius reflex  with his ankle fused.  Cerebellar function is intact and rapid alternating  movements in the upper extremities  bilaterally.   IMAGING STUDIES:  I reviewed the patient's lumbar spine MRI, performed  at  [facility] on June 12, 2006.  It demonstrates the patient has  multilevel degenerative changes throughout his lumbar spine.  The patient  has a mild spondylolisthesis at L5-S1 with disk degeneration.  He has  undergone pedicle screw and posterolateral fusion at that level.  There is  still some foraminal stenosis.  The patient has multifactorial spinal  stenosis, mild to moderate at L2-L3 through L4-L5 secondary to degenerative  changes.  At T11-T12, the patient has retrolisthesis.  He has a very disk  herniation and spondylosis at the disk space resulting in upsetting severe  spinal stenosis.  The patient has a large disk herniation which appears to  have emanated from the T11-T12 disk space and migrated in a cephalad  direction behind the T12 vertebral body which is causing significant spinal  cord compression and spinal stenosis.  The patient appears to have an old L2  compression fracture.   ASSESSMENT/PLAN:  1. T11-T12 herniated nucleus pulposis , spinal stenosis, thoracic      myelopathy. I had a long discussion with the patient and discussed the      treatment options with him.  I am recommending he undergo surgery to      decompress spinal cord.  I have recommended he undergo a thoracotomy      for a T11 corpectomy with anterior fusion from T10 down to T12, and      placement of an interbody prosthesis and anterior instrumentation.  I      described that surgery to him and I discussed the risks of surgery      including the risks of anesthesia, hemorrhage, infection, dural      tear/sinal fluid leak, spinal cord injury, nerve injury, paralysis,      injury to the artery causing a spina cord  causing paralysis, fusion      failure,  instrumentation failure, or malplacement, failure to relieve      the symptoms, worsening of his symptoms, etcetera.  We discussed      alternative treatments options which would be to do posterior surgery      but I think the anterior surgery would be safer to his spinal cord.  We      also discussed medical management but I told him I did not think this      would help.  I have answered all his questions and asked him to think      this over.  2. Cardiac issues.  The patient has a significant cardiac history and has      not followed up with his cardiologist, as he does not have medical      insurance.  Since the patient's clinical status has been stable over      the last 4 days, it has not worsened significantly, I would like to      wait on the surgery and start him on Decadron.  I want to ask the      cardiologist to see the patient for cardiac optimization and clearance.      I also asked Dr. Edwyna Shell to see the patient as well for exposure.     Cristi Loron, M.D.  Electronically Signed     JDJ/MEDQ  D:  06/12/2006  T:  06/13/2006  Job:  [id number]

## 2011-01-16 NOTE — Consult Note (Signed)
NAME:  [name], [name]                 ACCOUNT NO.:  [id number]   MEDICAL RECORD NO.:  [id number]          PATIENT TYPE:  INP   LOCATION:  3310                         FACILITY:  [facility]   PHYSICIAN:  Antonietta Breach, M.D.  DATE OF BIRTH:  09-08-1950   DATE OF CONSULTATION:  06/25/2006  DATE OF DISCHARGE:                                   CONSULTATION   REQUESTING PHYSICIAN:  Dr. Tressie Stalker.   REASON FOR CONSULTATION:  Depression.   HISTORY OF PRESENT ILLNESS:  [name] is a 60 year old male who was  admitted on the 13th of October for ruptured herniated disks in the T10  through T12 region and secondary numbness and motor dysfunction.   The patient underwent surgery, including requiring a thoracotomy to access  the vertebral column.  He had a fusion done with a metal frame placed in.   He has had difficulty with postoperative pain and made a statement that he  would be better off dead and actually verbalized suicide.  In subsequent  discussions with staff, he stated that this was a figure of speech.   The patient went on to talk to the undersigned about how he does tend to be  very quick to give figures of speech at times, not having a literal meaning  behind them.  He describes a story from his religious scripture of lepers  sitting outside a city and they state should we stay here and die or go into  the city and die, and he says that this is a kind of conversation he has to  express when he is between a rock and a hard place; he does not mean  anything literal by it.   The patient describes a number of interests in the future, including reasons  why he would never take his life.  He describes interests in his  grandchildren as well as his children, and enjoying the fellowship with his  family as well as his church.  He does not have any hallucinations or  delusions.  He is cooperative with care.  He is very open about his  frustration with remaining in the hospital as  well as not having insurance  to pay for his care, but he also has hope that everything will work out.   PAST PSYCHIATRIC HISTORY:  The patient has no history of major depression,  mania, hallucinations, or delusions.  He has no history of psychiatric care  or psychotropic medical management.   FAMILY PSYCHIATRIC HISTORY:  None known.   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  He is happily married with 3  children and 6 grandchildren.  He does not use any alcohol or drugs.   GENERAL MEDICAL PROBLEMS:  As above.   MEDICATIONS:  The MAR is reviewed.   ALLERGIES:  THE PATIENT HAS NO KNOWN DRUG ALLERGIES.   LABORATORY DATA:  CBC shows a white blood cell count of 35.8, hemoglobin  10.2.   REVIEW OF SYSTEMS:  Noncontributory.  MENTAL STATUS EXAM:  [name] is alert, oriented to all spheres.  His  memory is intact to immediate, recent, remote.  Thought content:  Please see  the History of Present Illness.  The patient states that he will frequently  use figures of speech and stories as he did when he was a Programmer, multimedia without  intending them to be taken literally.  He has a solid sense of humor and  enjoys describing events from his life as he talks to the undersigned.  Thought process:  Logical, coherent and goal-directed.  No looseness of  associations.  Insight is good; judgment is intact.  Speech involves normal  rate and prosody.  Fund of knowledge and intelligence:  Average.  Psychomotor tone is within normal limits.   ASSESSMENT:  AXIS I:  No psychiatric condition.  AXIS II:  None.  AXIS III:  See general medical problems.  AXIS IV:  General medical.  AXIS V:  65.   [name] is not at risk to harm himself or others.  He does agree to call  his doctor if he develops any depressive symptoms.  He also agrees to use  emergency services for any psychiatric emergency symptoms.   RECOMMENDATIONS:   Based upon this patient's coping tools that he uses in his  life, and his current functioning, there is no need for psychiatric  followup, psychotherapy, or medication.      Antonietta Breach, M.D.  Electronically Signed     JW/MEDQ  D:  06/26/2006  T:  06/27/2006  Job:  [id number]

## 2011-01-16 NOTE — Discharge Summary (Signed)
NAME:  [name], [name]                 ACCOUNT NO.:  [id number]   MEDICAL RECORD NO.:  [id number]          PATIENT TYPE:  INP   LOCATION:  2040                         FACILITY:  [facility]   PHYSICIAN:  Luis Abed, MD, FACCDATE OF BIRTH:  [birth year]   DATE OF ADMISSION:  07/20/2006  DATE OF DISCHARGE:  07/25/2006                                 DISCHARGE SUMMARY   PRINCIPAL DIAGNOSES:  1. Chest pain syndrome.      a.     Normal serial cardiac markers.  2. Status post bilateral pulmonary emboli.      a.     Diagnosed during recent hospitalization at [facility].      b.     Discharged with inferior vena cava filter/Coumadin (discharge       international normalized ratio level 2.8).      c.     Admitted with subtherapeutic international normalized ratio       level of 1.4.  3. Oral candidiasis.  4. Hypertension.      a.     Medications adjusted this admission.   SECONDARY DIAGNOSES:  1. Methicillin-sensitive bacteremia.      a.     Associated right ankle infection.      b.     Recently discharged on ciprofloxacin/rifampin regimen (duration       2-3 months, per infectious disease).  2. Status post thoracotomy October 15th, complicated by hemothorax      requiring chest October 22nd   PROCEDURES:  None.   REASON FOR ADMISSION:  [name] is a 60 year old male recently hospitalized  here at [facility] with a complicated hospital course during which he was  seen by our team in consultation.  He has known ischemic cardiomyopathy and  his cardiac status was stable at that time.   Following discharge, the patient return to [facility] with complaint  of chest pain and was found to have EKG changes.  They were concerned that  this represented acute coronary syndrome, and he was subsequently  transferred for further evaluation and management.   HOSPITAL COURSE:  Following admission, the patient ruled out for myocardial  infarction with normal serial cardiac markers.   The  patient was found to be subtherapeutic on Coumadin with an INR level of  1.4 on admission and was thus placed on Lovenox overlap, managed by pharmacy  during this brief stay.  At the time of discharge, INR rose to 2.   Medications were adjusted with respect to hypertension for better control.  ACE inhibitor was up-titrated as was Coreg prior to discharge.   The patient was also started on a 7-day course of Diflucan for treatment of  oral candidiasis.  He was continued on ciprofloxacin/rifampin, as previously  outlined, for treatment of MSSA.   The patient continued to remain clinically stable and, following  normalization of his INR, was cleared for discharge on hospital day #5.   At time of discharge, recommendation was to give a 6-mg dose of Coumadin.  The patient will need a prothrombin time drawn at home and forwarded  to our  clinic on Tuesday, November 27th.   Of note, certain medications were discontinued this admission, Lipitor,  hydrochlorothiazide, and Plavix (statin was discontinued secondary to recent  elevation of liver enzymes).   DISCHARGE MEDICATIONS:  1. Coumadin 5 mg daily.  2. Diflucan 200 mg daily x7 days total (new).  3. Coreg 12.5 mg b.i.d.  4. Lisinopril 40 mg daily.  5. Imdur 30 mg daily (new).  6. Ciprofloxacin 750 mg b.i.d.  7. Rifampin 300 mg b.i.d.  8. Protonix 40 mg daily.  9. Coated aspirin 81 mg daily.  10.Nitrostat 0.4 mg as directed.   INSTRUCTIONS:  The patient will need a follow-up prothrombin time drawn on  Tuesday, November 27th, with results forwarded to Dr. Doreatha Martin McDowell's office  and the [facility].   FOLLOWUP:  1. With Dr. Simona Huh in 2 weeks.  Arrangements will be made through      our office.  2. The patient is to keep previously scheduled followup appointment with      Dr. Dewayne Shorter this Wednesday, November 28th.   DISCHARGE CONDITION:  Stable.   LABORATORIES AT DISCHARGE:  INR 2.  Outstanding labs: INR 1.4 on  admission.  WBC 7.5, hemoglobin 11.5, hematocrit 34, platelets 320 on admission.  Electrolytes and renal function remained normal. Liver enzymes normal.  Decreased albumin 2.2 on  admission.  Serial cardiac markers normal.  Admission chest x-ray:  Persistent patchy infiltrate in posterior left lung  base.      Mark Serpe, PA-C      Luis Abed, MD, [facility]  Electronically Signed    GS/MEDQ  D:  07/25/2006  T:  07/25/2006  Job:  [id number]   cc:   Ines Bloomer, M.D.

## 2011-01-16 NOTE — Cardiovascular Report (Signed)
NAME:  [name], [name]                 ACCOUNT NO.:  [id number]   MEDICAL RECORD NO.:  [id number]          PATIENT TYPE:  INP   LOCATION:  2807                         FACILITY:  [facility]   PHYSICIAN:  Veverly Fells. Excell Seltzer, MD  DATE OF BIRTH:  29-Feb-1951   DATE OF PROCEDURE:  06/30/2006  DATE OF DISCHARGE:                              CARDIAC CATHETERIZATION   PROCEDURE:  Inferior vena cava filter placement.   INDICATION:  Mr. Casillas is a 60 year old male who has had a prolonged  hospitalization and has a recent hemothorax.  This morning, he went down  with radiology for an x-ray and became acutely short of breath and  tachycardic.  He had a CT scan of his chest performed that demonstrated  significant bilateral pulmonary emboli.  He was referred emergently for an  IVC filter placement in the setting of his hemodynamic instability and large  pulmonary embolism.   Procedural details, risks, and indications of the procedure were explained  to the patient.  Informed consent was obtained.  The left groin was prepped,  draped, and anesthetized with 1% lidocaine using normal sterile technique.  Using the modified Seldinger technique, the left femoral vein was accessed.  A 6-French delivery sheath was inserted into the left femoral vein and up to  the inferior vena cava where a venogram was performed.  The venogram  demonstrated an appropriate IVC size for accepting an IVC filter.  The right  renal vein was clearly identified.  The IVC filter was inserted through the  delivery sheath and was placed below the right renal vein and above the  iliac bifurcation without difficulty.  At the conclusion of the case, the  sheath was pulled, and manual pressure was used for hemostasis.  In the  setting of the patient's life-threatening pulmonary embolism, an intravenous  heparin bolus was given of 2500 units.  He will be started on a heparin drip  as soon as he reaches the floor.   CONCLUSION:  Successful  placement of an inferior vena cava filter.  As  above, heparin was given, and he will be started on a drip.  A stat  echocardiogram will be performed to assess his right ventricular strain.  He  will be given a 1 L fluid bolus.      Veverly Fells. Excell Seltzer, MD  Electronically Signed     MDC/MEDQ  D:  06/30/2006  T:  07/01/2006  Job:  [id number]

## 2011-01-16 NOTE — Discharge Summary (Signed)
NAME:  [name], [name]                 ACCOUNT NO.:  [id number]   MEDICAL RECORD NO.:  [id number]          PATIENT TYPE:  INP   LOCATION:  6531                         FACILITY:  [facility]   PHYSICIAN:  Thomas C. Wall, M.D.   DATE OF BIRTH:  April 28, 1951   DATE OF ADMISSION:  06/14/2005  DATE OF DISCHARGE:  06/24/2005                                 DISCHARGE SUMMARY   PRINCIPAL DIAGNOSIS:  Unstable angina/coronary artery disease.   OTHER DIAGNOSES:  1.  Status post right ankle arthrodesis with medial lateral internal      fixation performed by Dr. Bevely Palmer at [facility] June 12, 2005.  2.  Hypertension.  3.  Hyperlipidemia.  4.  Osteoarthritis.  5.  Degenerative joint disease.  6.  Status post bilateral total knee replacements.  7.  Spondylolisthesis of L5 and S2, status post L1-S1 fusion July 2004.  8.  Medication nonadherence.  9.  Methicillin-sensitive Staphylococcus aureus sepsis.   ALLERGIES:  NO KNOWN DRUG ALLERGIES.   PROCEDURE:  Left heart cardiac catheterization, PCI and stenting of the  right coronary artery with bare metal stent.   HISTORY OF PRESENT ILLNESS:  A 60 year old white male with prior history of  CAD, hypertension, hyperlipidemia, who secondary to recent problems with his  right ankle, underwent arthrodesis with medial lateral internal fixation  performed by Dr. Bevely Palmer at [facility] on Friday, June 12, 2005.  Patient awoke from surgery with significant ankle pain accompanied by chest  pain similar in character to previous angina.  Cardiac markers were sent off  and negative and EKG was not specific.  He was evaluated by cardiology and  underwent an adenosine Cardiolite revealing evidence of inferior/lateral  scar as well as mild inferior/lateral ischemia with an EF of 31%.  Decision  was made at that point to transfer the patient to [facility] for further evaluation and cardiac catheterization.   HOSPITAL COURSE:  On  admission, the patient was noted to be febrile with a  temperature of 101.8 and slightly elevated white count of 12.7.  Initially  catheterization was delayed and blood cultures urinalysis and chest x-ray  were performed.  UA and chest x-ray was normal.  Decision was made to pursue  catheterization on June 16, 2005, and this revealed 99% lesion in the  distal RCA and otherwise nonobstructive coronary artery disease with an EF  of 35 to 40%.  During his catheterization, the lab reported positive blood  cultures with methicillin-sensitive Staphylococcus aureus and, as a result,  any intervention to the right coronary artery was delayed and his sheath was  pulled.  Infectious disease was consulted and the patient was started on IV  vancomycin.  Orthopedics was also involved secondary to his recent ankle  surgery and his cast was removed June 18, 2005.  His right ankle/lower  leg remained casted.  He was not felt to be experiencing septic arthritis  and following conversation with Dr. Bevely Palmer by orthopedics, decision was made  not to reopen the wound.  Mr. Widrig remained afebrile following initiation  of vancomycin  and decision was made to pursue PCI on June 23, 2005.  Patient was taken back to the cath lab where a 2.75 x 12 mm Multilink VISION  bare metal stent was successfully placed in the distal right coronary  artery.  He tolerated this procedure well and has been without recurring  discomfort this morning.  He has been cleared for discharge by both  orthopedics and infectious disease with recommendation to remain on Avelox  400 mg daily for _________ nine days.  He is being discharged home today in  satisfactory condition.   DISCHARGE LABORATORY DATA:  Hemoglobin 11.9, hematocrit 35.4, wbc 12.9,  platelets 417.  Sodium 139, potassium 4.2, chloride 104, CO2 25, BUN 10,  creatinine 0.8, glucose 97, calcium 9.6.  Peak CK 39, MB 0.6, troponin I  0.02.  Total cholesterol 116,  triglycerides 106, HDL 35, LDL 60.  TSH 1.752,  free T4 0.99.  Urinalysis was negative.  Blood cultures were positive for  coagulase negative Staphylococcus species in clusters.   DISPOSITION:  Patient is being discharged home today in good condition.   FOLLOW UP PLANS AND APPOINTMENTS:  He is asked to follow up with Dr. Sherlyn Lick  of [facility] in one to two weeks.  Orthopedics has recommended  follow-up with Dr. Bevely Palmer as soon as possible.   DISCHARGE MEDICATIONS:  1.  Aspirin 325 mg daily.  2.  Plavix 75 mg daily x30 days.  3.  Diovan 160 mg daily.  4.  Caduet 10/10 mg daily.  5.  Lopressor 100 mg q.12h.  6.  Colace 100 mg b.i.d.  7.  Avelox 400 mg daily x9 more days.  8.  Nitroglycerin 0.4 mg sublingual p.r.n. chest pain.   OUTSTANDING LAB STUDIES:  None.   DURATION OF DISCHARGE ENCOUNTER:  60 minutes including physician time.      Ok Anis, NP      Jesse Sans. Wall, M.D.  Electronically Signed    CRB/MEDQ  D:  06/24/2005  T:  06/24/2005  Job:  [id number]   cc:   Harl Bowie, M.D.  Fax: [phone]   Darliss Cheney  Fax: [phone]

## 2011-01-16 NOTE — Cardiovascular Report (Signed)
NAME:  [name], [name]                 ACCOUNT NO.:  [id number]   MEDICAL RECORD NO.:  [id number]          PATIENT TYPE:  INP   LOCATION:  6531                         FACILITY:  [facility]   PHYSICIAN:  Salvadore Farber, M.D. LHCDATE OF BIRTH:  06-25-1951   DATE OF PROCEDURE:  06/23/2005  DATE OF DISCHARGE:                              CARDIAC CATHETERIZATION   PROCEDURE:  Bare metal stenting of the distal right coronary artery.   INDICATIONS FOR PROCEDURE:  The patient is a 60 year old gentleman with long-  standing coronary disease, status post a inferior myocardial infarction a  number of years ago with stenting of the RCA in that circumstance.  He now  presents with unstable angina occurring after surgery on his ankle.  He  underwent diagnostic angiography last week by Dr.  Gala Romney.  This  demonstrated chronic total versus sub-total occlusion of the sizeable obtuse  marginal.  It is nicely collateralized from the left.  He also had an 80%  stenosis of the distal RCA.  I was asked to perform percutaneous  revascularization of the RCA.  Plan is for medical management of the  marginal.  Procedure has been delayed due to two positive blood cultures.  He has now been cleared by infectious disease for percutaneous intervention.   DESCRIPTION OF PROCEDURE:  Informed consent was obtained.  Under 1%  lidocaine local anesthesia, a 6 French sheath was placed in the right common  femoral artery using the modified Seldinger technique.  Anticoagulation was  initiated with bivalirudin.  HCT was confirmed to be greater than 225  seconds.  A 6 [place] ART 3.5 guide was advanced over a wire and engaged in  the ostium of the RCA.  A Prowater wire was advanced to the distal PDA  without difficulty.  The lesion was pre-dilated using a 2.5 x 9 mm Maverick  at eight atmospheres.  The lesion was then stented using a 2.75 x 12 mm  Vision at 14 atmospheres.  Final angiography demonstrated no residual  stenosis  and TIMI III flow to the distal vasculature.   COMPLICATIONS:  None.   IMPRESSION/RECOMMENDATIONS:  Successful percutaneous intervention on the  distal right coronary artery using a bare metal stent.  The patient  tolerated the procedure well.  Aspirin should be continued indefinitely.  Plavix should be continued for at least 30 days.      Salvadore Farber, M.D. [facility]  Electronically Signed     WED/MEDQ  D:  06/23/2005  T:  06/23/2005  Job:  [id number]   cc:   Harl Bowie, M.D.  Fax: [phone]

## 2011-01-16 NOTE — Op Note (Signed)
NAME:  [name], [name]                 ACCOUNT NO.:  [id number]   MEDICAL RECORD NO.:  [id number]          PATIENT TYPE:  INP   LOCATION:  3399                         FACILITY:  [facility]   PHYSICIAN:  Ines Bloomer, M.D. DATE OF BIRTH:  02-May-1951   DATE OF PROCEDURE:  DATE OF DISCHARGE:                                 OPERATIVE REPORT   PREOPERATIVE DIAGNOSIS:  T11-12 herniated nucleus pulposus with lower  extremity weakness.   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   OPERATION PERFORMED:  Left thoracotomy, diskectomy, and stabilization,  corpectomy.   SURGEON:  Ines Bloomer, M.D.  Cristi Loron, M.D.   ANESTHESIA:  General anesthesia.   DESCRIPTION OF PROCEDURE:  This patient was brought to the operating room  with severe weakness in his left leg.  Dr. Lovell Sheehan was going to do the  diskectomy and corpectomy, but needed exposure.  He underwent general  anesthesia.  He was turned to the left lateral thoracotomy position and was  prepped and draped in the usual sterile manner.  An incision was made in the  left chest down over the ninth intercostal space and dissection was carried  down with electrocautery to the tenth rib.  We then decided to go in the  tenth intercostal space, so this was opened with electrocautery from  posteriorly superiorly.  A Tuffier was placed in the incision.  The muscle  had been divided with electrocautery.  We decided that to get more exposure,  we needed to take the tenth rib subperiosteally at the angle, and we did  that with the Alexander rib periosteal elevator, and then with [facility] rib  shears.  The Tuffier was placed in the incision, and then a malleable placed  to hold the diaphragm down.  We then started the dissection laterally over  the tenth interspace, reflecting the pleura medially and exposing the tenth,  eleventh and twelfth ribs, and then exposing the 11-12 interspace.  A  needle  was placed at the disk space, and this was confirmed with x-ray.  The  patient then underwent corpectomy and diskectomy, and then reconstruction by  Dr. Lovell Sheehan, who will dictate that separately.  Two chest tube sites were  made anteriorly and in a midaxillary line, and two 28 chest tubes were  placed into the chest.  The chest was closed with 5 paracostals and #1  Vicryl, interrupted #1 Vicryl in the muscle layer, 2-0 Vicryl in the  subcutaneous tissue and Ethicon skin clips.  A single On-Q catheter was  placed above the ribs in the usual fashion and secured in place with Steri-  Strips, and a Marcaine block done in the usual fashion.  The patient was  returned to the recovery room in stable condition.           ______________________________  Ines Bloomer, M.D.     DPB/MEDQ  D:  06/14/2006  T:  06/14/2006  Job:  [id number]   cc:   Cristi Loron, M.D.

## 2011-01-16 NOTE — Assessment & Plan Note (Signed)
[facility]                            CARDIOLOGY OFFICE NOTE   NAME:[name], [name]                        MRN:          [id number]  DATE:11/05/2006                            DOB:          Jul 06, 1951    PRIMARY CARE PHYSICIAN:  Dr. Sheria Lang, [facility].   REASON FOR VISIT:  Cardiac followup.   HISTORY OF PRESENT ILLNESS:  I saw [name] back in December.  His  history is detailed in my previous note.  I have been following him from  a cardiac perspective and he is also enrolled in our Coumadin Clinic,  with a history of bilateral pulmonary emboli status post inferior vena  cava filter.  He has an ischemic cardiomyopathy with mild to moderate  left ventricular dysfunction and underlying coronary artery disease,  which has been relatively stable even throughout a prolonged,  complicated hospital stay.  His electrocardiogram today shows sinus  rhythm with nonspecific ST-T wave changes.  He is due for a Coumadin  clinic visit today as well.  He states that he has had some recent  nausea and diarrhea and was unable to take his medicines yesterday or  this morning, reflected in his high blood pressure today.  He has had  some brief episode of chest pain and has used nitroglycerine twice over  the last month.  He continues to have problems with back pain and foot  pain that limit him.   ALLERGIES:  No known drug allergies.   CURRENT MEDICATIONS:  1. Coumadin as directed by our Coumadin clinic.  2. Coreg 12.5 mg p.o. b.i.d.  3. Lisinopril 40 mg p.o. daily.  4. Imdur 30 mg p.o. daily.  5. Enteric-coated aspirin 81 mg p.o. daily.  6. Darvocet-N 100 p.r.n.  7. Piroxicam 20 mg p.o. b.i.d.  8. Doxycycline 100 mg p.o. b.i.d.  9. Prilosec 20 mg p.o. daily.  10.Sublingual nitroglycerin 0.4 mg p.r.n.   REVIEW OF SYSTEMS:  As per history of present illness.   PHYSICAL EXAMINATION:  Blood pressure is 160/100, heart rate 83, weight  is 219  pounds.  The patient is comfortable, in no acute distress without  active chest pain.  NECK:  No elevated jugular venous pressure.  LUNGS;  Generally clear, without labored breathing or wheezing.  CARDIAC:  Regular rate and rhythm, no S3 gallop.  ABDOMEN:  Without bruits, soft, nontender.  EXTREMITIES:  No marked pitting edema.   IMPRESSION:  1. Coronary artery disease status post previous bare metal stent      placement to a high-grade right coronary artery stenosis in      December of 2006.  He has evidence of mild to moderate left      ventricular dysfunction and is on medical therapy at this point.      He is significantly hypertensive today but has not taken medicines      in the last 24 hours due to what sounds like a possible      gastrointestinal illness.  He states he is feeling better today and  will take his medicines when he gets home.  His electrocardiogram      shows nonspecific changes.  We will plan to see him back over the      next 3 months.  2. History of bilateral pulmonary emboli, status post inferior vena      cava filter, on Coumadin.  He is due for a Coumadin followup today.      He denies any active bleeding problems or major bleeding episodes.     Jonelle Sidle, MD  Electronically Signed    SGM/MedQ  DD: 11/05/2006  DT: 11/05/2006  Job #: [id number]

## 2011-01-16 NOTE — Op Note (Signed)
NAME:  [name], [name]                 ACCOUNT NO.:  [id number]   MEDICAL RECORD NO.:  [id number]          PATIENT TYPE:  INP   LOCATION:  3108                         FACILITY:  [facility]   PHYSICIAN:  Cristi Loron, M.D.DATE OF BIRTH:  [birth year]   DATE OF PROCEDURE:  06/14/2006  DATE OF DISCHARGE:                                 OPERATIVE REPORT   PREOPERATIVE DIAGNOSES:  T11-12 herniated nucleus pulposus, degenerative  disk disease, stenosis, thoracic myelopathy.   POSTOPERATIVE DIAGNOSES:  T11-12 herniated nucleus pulposus, degenerative  disk disease, stenosis, thoracic myelopathy.   PROCEDURES:  Thoracotomy for T11 corpectomy; insertion of interbody  prosthesis from T10 to T12 (Danek titanium interbody prosthesis); interbody  arthrodesis, T10-11, 11-12, with local autograft bone; anterior  instrumentation, T10 to T12 with Antares titanium screws and rods.   SURGEONS:  Cristi Loron, M.D.  Ines Bloomer, M.D.   ASSISTANT:  Clydene Fake, M.D.   ANESTHESIA:  General endotracheal.   ESTIMATED BLOOD LOSS:  700 cc.   SPECIMENS:  None.   DRAINS:  Chest tubes.   COMPLICATIONS:  None.   BRIEF HISTORY:  The patient is a 60 year old white male, who has suffered  from severe back and bilateral leg pain, numbness, tingling and weakness.  He was worked up with a lumbar MRI, which demonstrated a large herniated  disk the T11-12, with severe spinal cord compression.  I discussed the  various treatment options with the patient, including surgery.  The patient  has weighed the risks, benefits and alternatives of surgery and decided to  proceed with a thoracotomy for a T11 corpectomy and fusion/instrumentation  from T10 down to T12.   DESCRIPTION OF PROCEDURE:  The patient was brought to the operating room by  the anesthesia team.  General endotracheal anesthesia was induced.  The  patient was turned to the lateral position with his left side up.  This was  prepared with  Betadine Scrub with Betadine solution.  Sterile drapes were  applied.  Dr. Edwyna Shell performed a thoracotomy; for the details of this,  please refer to his operative note.   After Dr. Edwyna Shell had provided exposure to the left lateral aspect of the T11  vertebral body, I obtained intraoperative radiographs to confirm our  location, and then used the scalpel to incise the T11-12 and 10-11  intervertebral disks and perform a partial diskectomy with a pituitary  forceps.  Then, we had exposed the radicular arteries at T10, 11 and 12.  We  did not take any of these radicular arteries; instead, we worked around  them.  I used the Leksell rongeur and the high-speed drill to perform a T11  corpectomy.  We saved some of the bone from the vertebral body to be used as  autograft bone in the fusion process.  I used a high-speed drill to  carefully drill away the remainder of the posterior vertebral body cortex,  and this gained access to the epidural space.  Upon doing this, I upon  discharge the microdissectors and encountered a huge disk herniation, which  was significantly compressing the  spinal cord.  I removed it in multiple  fragments using the pituitary forceps, decompressing the thecal sac/spinal  cord from T10-11 down to T11-12.   Having completed the decompression, we now turned our attention to the  arthrodesis.  We obtained a thoracic Versapen titanium interbody prosthesis.  We filled the center with local autograft bone and then carefully placed it  into the T11 corpectomy site, being careful not to injure the T11 radicular  artery.  We then expanded the prosthesis until it had a good snug fit and  was securely placed in the interspace.  We then filled lateral to the  prosthesis with local autograft bone as well, completing the interbody  arthrodesis from T10 down to T12.   We now turned our attention to the instrumentation.  We used bipolar  electrocautery and the Kitner swabs to expose  the lateral aspect of the T10  and T12 vertebral bodies, being careful, again, not to injury the vertebral  radicular artery.  We retracted the radicular arteries at those levels out  of harm's way, and then placed a small staple from the Antares system to the  lateral aspect of the T10 and T12 vertebral bodies.  We then used the awl,  the tap, and then placed two 6.5 x 45-mm screws at T10, two at T12.  We then  connected the screws with two titanium rods, which was connected and  fastened in place with the caps, which we tightened appropriately.  We  placed a cross connector between the rods and tightened it.  This completed  the instrumentation.  We then obtained hemostasis using the bipolar  electrocautery.  We inspected the thecal sac and it was well decompressed,  and we had good hemostasis.  At this point, Dr. Edwyna Shell took over the  operation and closed the thoracotomy; for further details of his part of the  operation, please refer to his typed operative note.   The patient was then transported to the postanesthesia care unit in stable  condition.  All sponge, instrument and needle counts were correct at the end  of the case.      Cristi Loron, M.D.  Electronically Signed     JDJ/MEDQ  D:  06/14/2006  T:  06/14/2006  Job:  [id number]   cc:   Ines Bloomer, M.D.

## 2011-01-16 NOTE — Assessment & Plan Note (Signed)
[facility]                            CARDIOLOGY OFFICE NOTE   NAME:Rivkin, JUNIOUS RAGONE                        MRN:          [id number]  DATE:08/02/2006                            DOB:          1950/09/27    HISTORY OF PRESENT ILLNESS:  I saw Mr. Nicholl during a recent prolonged  hospitalization for cardiac preoperative assessment. His history is  detailed in recent inpatient history and physicals as well as discharge  summaries including an ischemic cardiomyopathy with mild to moderate  left ventricular dysfunction, status post previous coronary  interventions including most recently a bare metal stent in December  2006. He was previously followed in [place] for his cardiac care. He  underwent a thoracotomy for access to a T10-T12 spinal fusion that was  ultimately complicated by a hemothorax requiring chest tube and surgical  drainage as well as bilateral pulmonary emboli requiring anticoagulation  and placement of an inferior vena cava filter. He had a methicillin  sensitive staphylococcus aureus bacteremia and ankle infection and was  seen by Orthopedic Surgery as well as Infectious Disease. He was  recently discharged after a subsequent hospitalization with recurrent  chest pain at which time he was noted to have a sub-therapeutic Coumadin  level and pain likely due to continued bilateral pulmonary emboli that  have not yet resolved. This was confirmed by a followup CT scan in  [place] at his second presentation. He has been re-anticoagulated and  now has this followed through home health nursing with results sent to  our Coumadin Clinic. At this point, he has not yet established with a  primary care doctor, although in discussing this with the patient and  his wife, they are interested in arranging followup with Dr. Sheria Lang of  [facility] in their area.   He is no longer experiencing his presenting chest pain, which was  pleuritic, also involving his right shoulder. He still has some post-  surgical pain following his thoracotomy. He did experience some problems  with cold sores during his active illness and has had some recurrence of  this.   An electrocardiogram today shows sinus rhythm with left atrial  enlargement and nonspecific ST-T wave changes. He is not having any  active bleeding problems. He still is ambulating with a 4-point walker.   ALLERGIES:  No known drug allergies.   PRESENT MEDICATIONS:  1. Coumadin as directed by the Coumadin Clinic with a goal INR of 2.0      to 3.0.  2. Diflucan 200 mg p.o. daily.  3. Coreg 12.5 p.o. b.i.d.  4. Lisinopril 40 mg p.o. daily.  5. Imdur 30 mg p.o. daily.  6. Cipro 750 mg p.o. b.i.d.  7. Rifampin 300 mg p.o. b.i.d.  8. Protonix 40 mg p.o. daily.  9. Enteric-coated aspirin 81 mg p.o. daily.  10.Darvocet p.r.n.  11.Nitroglycerine 0.4 mg sublingual.   REVIEW OF SYSTEMS:  As described in History of Present Illness.   PHYSICAL EXAMINATION:  Blood pressure, by me, 140/82, heart rate 82,  weight 212 pounds. The patient is in no acute  distress. Denying any  active chest pain today.  NECK: Reveals no elevated jugular venous pressure, without bruits. No  thyromegaly is noted.  LUNGS:  Are clear with diminished breath sounds at the left base. Thorax  shows a well-healing left posterior thoracotomy incision.  CARDIAC: Reveals a regular rate and rhythm without loud murmur, rub or  gallop.  ABDOMEN: Soft and nontender.  EXTREMITIES: Show no significant pitting edema. No ulcerative changes  noted.  MUSCULOSKELETAL: No kyphosis noted.  NEURO PSYCH: The patient is alert and oriented x3.   IMPRESSIONS AND RECOMMENDATIONS:  1. History of ischemic cardiomyopathy with mild to moderate left      ventricular dysfunction and underlying coronary atherosclerosis.      Mr. Dalton has fortunately been quite stable from a cardiac      perspective throughout his  recent complicated hospital stay. My      plan will be medical therapy at this point going forward and follow      up of his symptoms over the next 3 months.  2. Hypertension, fluctuating recently with his other co-morbid      illness.  3. Bilateral pulmonary emboli, status post inferior vena cava filter      and now on Coumadin with recent therapeutic INR. Coumadin will need      to be continued at least for 6 months, potentially longer depending      on his clinic progress. Our Coumadin Clinic has been following this      just recently and the patient and his wife express an interest in      trying to simplify his followup to [facility]  4. History of methicillin sensitive staphylococcus aureus with ankle      infection. The patient was seen by both Infectious Disease and      Orthopedic Surgery during his original hospitalization. It was      recommended at that time that he would need antibiotics for 2-3      months. I would anticipate that he will need followup with both      Infectious Disease and Orthopedic Surgery in the interim to better      direct this.  5. History of oral candidiasis as well as herpetic ulcers. He has just      finished a course of Diflucan and is still having some problems      with ulcerations. We will place him back on the Valtrex for the      time-being as this was fairly effective during his original      hospitalization.  6. History of thoracotomy complicated by hemothorax. The patient was      recently seen by Dr. Edwyna Shell and is doing fairly well.  7. In terms of followup, Mr. Cretella and his wife will try to arrange      followup with Dr. Sheria Lang of [facility] since he      apparently already sees members of the family. My hope is that he      can help to consolidate followup for Mr. Culmer regarding his      multiple co-morbidities and we can plan to focus on his cardiac      status going forward.    Jonelle Sidle, MD   Electronically Signed    SGM/MedQ  DD: 08/02/2006  DT: 08/02/2006  Job #: [id number]

## 2011-01-16 NOTE — Op Note (Signed)
NAME:  [name], [name]                 ACCOUNT NO.:  [id number]   MEDICAL RECORD NO.:  [id number]          PATIENT TYPE:  INP   LOCATION:  2912                         FACILITY:  [facility]   PHYSICIAN:  Ines Bloomer, M.D. DATE OF BIRTH:  [birth year]   DATE OF PROCEDURE:  07/02/2006  DATE OF DISCHARGE:                                 OPERATIVE REPORT   PREOPERATIVE DIAGNOSIS:  Status post T12 discectomy with reconstruction,  postoperative hematoma.   POSTOPERATIVE DIAGNOSIS:  Status post T12 discectomy with reconstruction,  postoperative hematoma.   OPERATION PERFORMED:  Left exploratory thoracotomy, drainage of hematoma.   SURGEON:  Ines Bloomer, M.D.   ASSISTANT:  Pecola Leisure, P.A.-C.   After percutaneous insertion of all monitoring lines, the patient was turned  to the left lateral thoracotomy incision and was prepped and draped in the  usual sterile manner.  The previous thoracotomy incision was opened about  2/3 length and the muscular sutures of Vicryl were cut as well as the  pericostals were cut.  The patient had a large hematoma.  He had a previous  9th interspace thoracotomy for a T12 collapsed disc with reconstruction.  He  did well for six days and then developed a hemothorax which was drained with  a chest tube. On CT scan, he continued to have residual tumor and because of  this, he was brought back to the operating room.  After the incision had  been reopened, the old hematoma was evacuated, it took awhile to do this.  We took out at least 1 to 1.5 units of old blood, this had to be taken out  from the previous reconstruction site and around the reconstruction site  there was an intercostal vessel that appeared to be intermittently bleeding.  This was oversewn with 2-0 Vicryl in a horizontal mattress fashion.  After  that had been done, we got all the old blood off the diaphragm, the  pericardium and the pleura as well as off the left lower lobe.  We had to do  a partial decortication stripping the blood off the left lower lobe.  This  was very tedious and time consuming.  The area was irrigated copiously until  most of the blood was removed.  We then placed two chest tubes through the  old chest tube sites, a right angle and a straight chest, one anteriorly and  one down near the diaphragm, and then a third chest tube posteriorly.  A  single On-Q catheter was placed above the ribs in the usual fashion and  Marcaine block was done in the usual fashion.  The chest was closed with #2  pericostals and #1 Vicryl on the muscle layer, and 0 Prolene in the skin.  The patient was returned to the recovery room in stable condition.           ______________________________  Ines Bloomer, M.D.     DPB/MEDQ  D:  07/02/2006  T:  07/03/2006  Job:  [id number]

## 2011-01-16 NOTE — H&P (Signed)
NAME:  [name], [name]                 ACCOUNT NO.:  [id number]   MEDICAL RECORD NO.:  [id number]          PATIENT TYPE:  INP   LOCATION:  3304                         FACILITY:  [facility]   PHYSICIAN:  Verne Grain, MD   DATE OF BIRTH:  29-Oct-1950   DATE OF ADMISSION:  06/14/2005  DATE OF DISCHARGE:                                HISTORY & PHYSICAL   PRIMARY ORTHOPEDIST:  Dr. Darliss Cheney.   PRIMARY CARE PHYSICIAN:  None (although wife goes to [facility] where the patient would to if he did have primary care  physician).   PRIMARY CARDIOLOGIST:  None, although he has had cardiac catheterization  with PCI and stents performed by Dr. Leeann Must at [facility].   CHIEF COMPLAINT:  Postoperative (status post repeat arthrodesis right ankle  with medial/lateral internal fixation June 12, 2005 by Dr. Darliss Cheney  at [facility]) chest pain with negative cardiac  markers/negative EKG (only frequent PVCs on telemetry postop day #1),  evaluated with an adenosine Cardiolite which revealed an ejection fraction  of 31% with inferolateral infarct and mild inferolateral ischemia  transferred from [facility] for cardiac catheterization.   HISTORY OF PRESENT ILLNESS:  A 60 year old male with hypertension,  hyperlipidemia, coronary artery disease with the onset of exertional chest  pain in 2002 for which he underwent cardiac catheterization by Dr. Leeann Must  at [facility] with placement of 2 stents to the right coronary  artery.  Patient subsequently had a recurrence of his exertional chest pain  in September of 2003 which prompted a repeat cardiac catheterization which  showed stenosis of a region that was apparently at the edge of the stents  that were placed.  The patient had an additional PCI with an additional  stent placed, initially again did well, however, subsequently experienced  recurrent chest discomfort that was relieved with rest.   This time, however,  the patient did not report symptoms to any physician, he just modified his  activity to treat chest pains as they occurred.  He reports that his pattern  of chest pain has been stable over the past 2 years occurring mainly when  walking up hill or doing vigorous activity particularly vigorous activity.  The chest pain is burning in nature, it is substernal, it does not  radiate.  As mentioned above, it is brought on by exertion and relieved with  rest.  There are no accompanying symptoms of shortness of breath, radiation,  nausea, vomiting or diaphoresis with these symptoms.  The chest pain is not  positional and not reproduced by palpation per the patient report.  The  patient had difficulty with his right ankle requiring repeat arthrodesis  with mediolateral internal fixation.  This was performed by Dr. Bevely Palmer on  Friday, June 12, 2005.  The patient apparently awoke from surgery with  significant ankle pain that was accompanied by chest pain that was similar  in character to the pain that he experienced when walking up hills or doing  vigorous activity.  Notes state that this pain resolved spontaneously,  although the patient's daughter believes that he may have received  sublingual nitroglycerin as part of the resolution of his pain.  The pain  also was noted per note to recur later on postop day #1, but then apparently  was relieved when a Dilaudid PCA was prescribed for treatment of the  patient's complaint of ankle pain.  The patient's EKGs show only nonspecific  ST and T wave abnormalities but no changes diagnostic of ischemia.  Serial  CKs and troponins were negative.  Telemetry was remarkable for frequent PVCs  on postop day #1; however, there were no other significant abnormalities  detected.  The patient was evaluated by Unice Cobble with  recommendations for adenosine Cardiolite that revealed evidence of  inferior/lateral scar and mild  inferior/lateral ischemia and an ejection  fraction of 31%.  The patient was transferred from [facility] to  [facility] for cardiac catheterization.  Prior to transfer, the  patient was initiated on heparin 1000 units an hour (no bolus in light of  recent surgery), aspirin, metoprolol.  The patient presents to [facility] appearing in general fairly well.  He is quite stoic in his symptom  reporting stating that if he knew complaining of chest pain was going to  cause this much trouble that he would not have complained of it at all.  The patient reports no chest pain since postop day #1.  The patient's  admission finals are notable for a temperature of 101.8.  The highest  temperature prior to transfer was 99.6.  Patient's white blood cell count is  12.7.  Patient's right lower extremity is casted from above the knee to the  level of the toes.  His toes are somewhat edematous but they are not  erythematous, they are not painful and they are not particularly increased  in warmth.  No other abnormalities identified.  Lung fields are clear.  No  urinary symptoms.   ALLERGIES/ADVERSE REACTIONS:  No known drug allergies.   CURRENT MEDICATIONS:  1.  Colace 100 p.o. b.i.d.  2.  Valsartan 160 mg p.o. daily.  3.  Hydrochlorothiazide 25 mg p.o. daily.  4.  Lactated ringers 75 cc per hour, that was discontinued prior to transfer      from [facility].  5.  Norvasc 5 mg p.o. daily.  6.  Lipitor 10 mg p.o. q.h.s.  7.  Metoprolol 25 mg p.o. q.12h.  8.  Heparin drip at 1000 units per hour.  9.  Nitro paste a half inch q.6h.  10. Aspirin.  11. p.r.n.'s including Tylenol, Phenergan, Zofran, Milk of Magnesia,      Benadryl, Ativan, Ambien, Percocet, Toradol and morphine.   HOME MEDICATIONS:  1.  Diovan 160/12.5 mg 1 tablet p.o. daily.  2.  Caduet 5/10 mg 1 tablet p.o. daily.  3.  Patient has been told to take aspirin in the past; however, he has not     taken  aspirin consistently for reasons that are unclear.  He has had no      adverse reactions to this medication.  He states I have just been      lazy.   PAST MEDICAL HISTORY:  1.  Coronary artery disease, status post exertional chest pain in 2002 which      led to cardiac catheterization by Dr. Leeann Must at [facility]      August 25, 2001 with 2 stents placed in the right coronary artery,      initial  improvement with recurrent exertional symptoms in 2003 prompting      repeat cardiac catheterization May 03, 2002 with another stent to      the right coronary artery apparently near the end of where the previous      stents were placed, additional exertional chest pain recurring in a      stable pattern over the past 2 years with no formal evaluation, symptoms      treated with activity modification as described in the HPI.  2.  Stoic reporter of symptoms and suboptimal compliance with previous      medical recommendations.  (Patient reports having no primary care      physician and no cardiologist that he sees on a regular basis.  He has      not been taking aspirin on a regular basis for no specific reason other      than saying that he has been too lazy.)  3.  Hypertension.  4.  Hyperlipidemia.  5.  Spondylolisthesis of L5 and S2, status post L5, S1 fusion (July 2004).  6.  Osteoarthritis/DJD/status post bilateral total knee replacements.   SOCIAL HISTORY:  The patient lives in [place].  He previously resided in  [place] prior to 2005.  He is married.  He works at his own [employer].  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  He reports no history of tobacco, alcohol or illicit drug  use.   FAMILY HISTORY:  The patient's mother is alive at age 80, although she has  had heart attacks that occurred in her 40s.  The patient's father died at  age 80 after multiple heart attacks with congestive heart failure.  The  father  apparently had his first heart attack in his 81s.  Patient has one  brother and 2 sisters, one of whom is morbidly obese with diabetes and  heart trouble.  The patient has 3 children all of whom are healthy.   REVIEW OF SYSTEMS:  Notable for a fever with a temperature of 101.8.  Also  complains of headache associated with nitroglycerin and chest pain as  described in the HPI and right lower extremity casting status post surgery  as described in the HPI, but otherwise all other systems are negative.  The  patient reports no recent weight gain or weight loss.  No heat or cold  intolerance.  No nausea, vomiting or diaphoresis.  No bowel or bladder  complaints.  No acute neuropsychiatric complaints.  No acute changes in  auditory or visual acuity.  No shortness of breath, dyspnea on exertion,  orthopnea, PND, edema, presyncope, syncope, claudication, cough or wheezing.   PHYSICAL EXAMINATION:  VITAL SIGNS:  Temperature 101.8 (maximal temperature prior to transfer 99.6), heart rate of 79, respiratory rate 20, blood  pressure 161/87, height 6 feet 0 inches, weight 235 pounds, oxygen  saturation on [place] note recorded as 94% on room air.  GENERAL:  Patient is in no apparent distress.  He is alert.  He answers  questions appropriately.  HEENT:  He is normocephalic, atraumatic.  Extraocular eye movements are  intact.  Oropharynx is pink and moist without lesions.  NECK:  Neck examination is supple.  There are no bruits.  There is no  jugular venous distention.  HEART:  Cardiovascular exam reveals a regular S1 and a regular S2.  LUNGS:  Lung fields are clear to auscultation bilaterally.  EXTREMITIES:  Right lower  extremity is casted from above the knee to the  level of the toes.  Toes are slightly edematous but not particularly  increased in warmth and not painful.  There is no obvious drainage or other  abnormality appreciated around cath site.  Lower extremity examination  reveals no  evidence of edema in the left lower extremity and right lower  extremity is casted as described above.  Femoral pulses are easily palpated  and there is no evidence of femoral bruits.  Distal pulses in  left lower  extremity are 2+ and likewise 2+ and symmetric in the upper extremities.  ABDOMEN:  Soft, nontender, nondistended with positive bowel sounds.  NEUROLOGIC:  Alert and oriented.  He moves all 4 extremities without  difficulty with no apparent focal neurologic deficits although gait is not  tested in light of right lower extremity casting as previously described.  SKIN:  Limited skin examination reveals no evidence of acute rash.  There is  some evidence of chronic sun exposure but no other abnormalities  appreciated.   EKG (June 14, 2005 at 9:30 a.m.):  Sinus rhythm at a rate of 87 with  normal axis, normal PR, QRS and QTC intervals.  There is an RSR prime noted  in V1 and there are some biphasic T waves noted on leads 3 and aVF as well  as some T wave flattening in V5 and 6 noted in previous leads; however,  there are no changes that are clearly diagnostic for ischemia.  There are no  Q waves appreciated.  There is no clear evidence of hypertrophy.  Telemetry  strips from June 13, 2005 do show frequent PVCs, however, no further  ectopy has been appreciated on serial 12 leads obtained at [facility].   Adenosine Cardiolite:  Inferior and lateral scar with mild inferior and  lateral ischemia, global hypokinesis with an EF of 31%.   LABORATORY VALUES:  White blood cell count 12.7, hematocrit 36, platelet  count 186.  Sodium 138, potassium 3.7, chloride 101, bicarbonate 30, BUN 10,  creatinine 0.8, glucose 120.  Anion gap of 7.  CK 87, 73, 64, 58.  Troponin  I 0.00.  Calcium 9.2.  Osmolality 267%.  Neutrophils 76%.   IMPRESSION AND PLAN:  A 60 year old male, stoic, hypertension,  hyperlipidemia, coronary artery disease, status post right coronary artery stents x2 in  2002 and repeat cardiac catheterization with RCA stent x1 in  2003 with chronic stable exertional chest pain over the past 2 years, no  formal evaluation since 2003 with no change in exertional chest pain pattern  and felt chest pain at rest occurred in the setting of severe ankle pain  occurring after right ankle surgery as described in the HPI.  CK, troponin,  EKG negative other than PVCs noted on telemetry strip occurring on postop  day #1.  No recurrent chest pain since postop day #1.  Adenosine Cardiolite  with inferolateral infarct and inferolateral mild ischemia with ejection  fraction 31% on adenosine Cardiolite test as outline above, transferred to  [facility] in anticipation of cardiac catheterization.  Temperature  on admission 101.8, with a white blood cell count of 12.7.  Complains of  headache on nitroglycerin.   Problem 1.  Postop chest pain/history of stable exertional chest  pain/positive adenosine Cardiolite with inferior and lateral  infarct/ischemia and global hypokinesis with an ejection fraction 31%.  We  will continue the patient on aspirin and heparin (we will avoid heparin  boluses  status post recent surgery, metoprolol, valsartan, we will hold  nitro paste as the patient has had no chest pain since postop day #1 and has  complained of headaches associated with nitroglycerin), n.p.o. after  midnight in anticipation of cardiac catheterization presuming there is no  obvious evidence of infection.   Problem 2. Temperature 101.8 (white blood cell count 12.7).  No obvious  signs of focal infection by exam or history and the presence of the  patient's right lower extremity cast precludes a thorough evaluation of his  wound.  This was discussed with the orthopaedic surgeon on call (Dr. Sherlean Foot)  who felt that the time course was too early for any evidence of wound  infection and thought that the patient's temperature was more likely related  to his postoperative  status.  He recommended elevation and ice to the  affected extremity and to follow clinically for any other signs that would  be concerning for infection.  We will also check a urinalysis and urine  culture, we will check a chest x-ray PA and lateral and check blood cultures  x2 again to exclude any obvious evidence of infection.  The patient has had  no shortness of breath to raise suspicion for deep venous thrombosis or  pulmonary embolism.  He is currently being treated with heparin for his  symptoms of chest pain that occurred postoperatively as described above.  If  the patient has any recurrent temperatures or if he should develop any  symptoms of shortness of breath, lower extremity ultrasound could be  reconsidered.   Problem 3.  Hypertension.  We will continue the patient on the regimen of  valsartan and Norvasc as he takes at home.  We will increase the dose of the  Norvasc to help better control his blood pressure and add metoprolol to optimize his CAD regimen (goal blood pressure less than 135/85).  We will  hold his hydrochlorothiazide to help optimize his fluid status prior to  cardiac catheterization.   Problem 4.  Hyperlipidemia.  Continue Lipitor as previously prescribed.  Check LFTs and check fasting lipid profile with a.m. labs to assure that  lipids are at goal (goal LDL less than 70, goal triglycerides less than  150).   Problem 5.  Blood glucose of 120.  Patient has no history of diabetes.  He  is postoperative in his course.  We will check a hemoglobin A1c to exclude  any evidence of occult insulin resistance.   Problem 6.  Potassium equals 3.7.  We will supplement with 40 mEq of  potassium x1 and recheck in the morning.           ______________________________  Verne Grain, MD     DDH/MEDQ  D:  06/14/2005  T:  06/14/2005  Job:  [id number]

## 2011-03-26 ENCOUNTER — Ambulatory Visit: Payer: Self-pay | Admitting: Cardiovascular Disease

## 2011-04-16 ENCOUNTER — Encounter: Payer: Self-pay | Admitting: Cardiovascular Disease

## 2011-04-17 ENCOUNTER — Ambulatory Visit (INDEPENDENT_AMBULATORY_CARE_PROVIDER_SITE_OTHER): Payer: Medicare Other | Admitting: Cardiovascular Disease

## 2011-04-17 ENCOUNTER — Encounter: Payer: Self-pay | Admitting: Cardiovascular Disease

## 2011-04-17 VITALS — BP 124/70 | HR 55 | Resp 14 | Ht 71.0 in | Wt 252.8 lb

## 2011-04-17 DIAGNOSIS — I10 Essential (primary) hypertension: Secondary | ICD-10-CM

## 2011-04-17 DIAGNOSIS — I2510 Atherosclerotic heart disease of native coronary artery without angina pectoris: Secondary | ICD-10-CM

## 2011-04-17 NOTE — Assessment & Plan Note (Signed)
BP well controlled.

## 2011-04-17 NOTE — Assessment & Plan Note (Signed)
Stable. No changes. Continue current meds. Lipids followed in primary care.

## 2011-04-17 NOTE — Progress Notes (Signed)
History of Present Illness:60 yo WM with history of CAD s/p two drug eluting stents to RCA 11/09, HTN, OA here today for routine follow up. He tells me that he is doing well overall.  He was seen in our office in December 2010 for risk assessment prior to left rotator cuff surgery. His Plavix was stopped at the end of 2010. He has chronic pain issues with his ankle. He has had a spinal stimulator for pain that worked but was stopped after a short course. He has plans for a permanent spinal stimulator.   He has stable chest pain. No changes in frequency or intensity. No palpitations, near syncope, syncope, orthopnea or PND. His right foot and ankle is severely painful. He had a prior fall and has had fusion of the ankle. He does not think his chest pain is any different than it was at last visit.   His primary care is Dr. Desmond Dike at [facility]. His cholesterol is followed in primary care.   Past Medical History  Diagnosis Date  . Arthritis   . CAD (coronary artery disease)     s/p prior PCI  . Hypertension     Past Surgical History  Procedure Date  . Carpal tunnel release     Bilateral  . Rotator cuff repair   . Total knee arthroplasty     Bilateral  . Foot surgery     Right  . Ankle surgery     Fusion  . Back surgery     x3  . Pacemaker insertion     [place] filter  . Thoracotomy     For access to a T10-12 fusion complicated by hemothorax and 44 day hospitalization  . Coronary angioplasty with stent placement     Stenting of the RCA as well as other stenting procedures in the past    Current Outpatient Prescriptions  Medication Sig Dispense Refill  . aspirin 81 MG tablet Take 81 mg by mouth daily.        . carvedilol (COREG) 12.5 MG tablet Take 12.5 mg by mouth 2 (two) times daily.        Marland Kitchen doxycycline (DORYX) 100 MG DR capsule Take 100 mg by mouth 2 (two) times daily.        . hydrochlorothiazide 25 MG tablet Take 25 mg by mouth daily.        .  isosorbide mononitrate (IMDUR) 30 MG 24 hr tablet Take 30 mg by mouth daily.        Marland Kitchen lisinopril (PRINIVIL,ZESTRIL) 40 MG tablet Take 40 mg by mouth daily.        . Morphine Sulfate (MS CONTIN PO) Take 30 mg by mouth 3 (three) times daily.        Marland Kitchen NITROSTAT 0.4 MG SL tablet ONE TABLET UNDER TONGUE EVERY 5 MINUTES AS NEEDED FOR CHEST PAIN **MAY REPEAT UP TO 3 TIMES**  25 tablet  4  . oxyCODONE-acetaminophen (PERCOCET) 7.5-500 MG per tablet Take 1 tablet by mouth 3 (three) times daily.        . potassium chloride (KLOR-CON) 10 MEQ CR tablet Take 10 mEq by mouth daily.        . pravastatin (PRAVACHOL) 80 MG tablet Take 80 mg by mouth daily.        Marland Kitchen oxyCODONE-acetaminophen (PERCOCET) 10-325 MG per tablet Take 1 tablet by mouth every 4 (four) hours as needed.         Allergies not on file  History   Social History  . Marital Status: Married    Spouse Name: N/A    Number of Children: N/A  . Years of Education: N/A   Occupational History  . disability    Social History Main Topics  . Smoking status: Unknown If Ever Smoked  . Smokeless tobacco: Not on file  . Alcohol Use: No  . Drug Use: No  . Sexually Active: Not on file   Other Topics Concern  . Not on file   Social History Narrative   Married with 3 children    Family History  Problem Relation Age of Onset  . Heart attack Mother   . Coronary artery disease Mother   . Lung disease Father     Review of Systems:  As stated in the HPI and otherwise negative.   BP 124/70  Pulse 55  Resp 14  Ht 5\' 11"  (1.803 m)  Wt 252 lb 12.8 oz (114.669 kg)  BMI 35.26 kg/m2  Physical Examination: General: Well developed, well nourished, NAD HEENT: OP clear, mucus membranes moist SKIN: warm, dry. No rashes. Neuro: No focal deficits Musculoskeletal: Muscle strength 5/5 all ext Psychiatric: Mood and affect normal Neck: No JVD, no carotid bruits, no thyromegaly, no lymphadenopathy. Lungs:Clear bilaterally, no wheezes, rhonci,  crackles Cardiovascular: Regular rate and rhythm. No murmurs, gallops or rubs. Abdomen:Soft. Bowel sounds present. Non-tender.  Extremities: No lower extremity edema. Pulses are 2 + in the bilateral DP/PT.  ZOX:WRUEA bradycardia, rate 57 bpm.

## 2011-04-21 ENCOUNTER — Encounter: Payer: Self-pay | Admitting: Cardiology

## 2011-04-21 ENCOUNTER — Telehealth: Payer: Self-pay | Admitting: Cardiovascular Disease

## 2011-04-21 DIAGNOSIS — Z01810 Encounter for preprocedural cardiovascular examination: Secondary | ICD-10-CM

## 2011-04-21 NOTE — Telephone Encounter (Signed)
[name] would like to set up a cath for chest pain. Spoke with Dr. Clifton James, will proceed with 05/05/11 @ 0900. Patient will come for blood work 04/30/11 and I will mail him his cath lab instructions.

## 2011-04-21 NOTE — Telephone Encounter (Signed)
Per pt call he changed his mind and would like to do a cath but he has questions he wants to ask first

## 2011-04-23 ENCOUNTER — Telehealth: Payer: Self-pay | Admitting: Cardiovascular Disease

## 2011-04-23 NOTE — Telephone Encounter (Signed)
Per pt call pt wanted to check on date and time of heart CATH procedure. Pt also was told blood work should be done seven days before the procedure. Pt is currently scheduled to have blood work on August 30th and pt was under the impression that his heart CATH procedure was to be scheduled on or around September 4th. Please return pt call to advise/discuss.

## 2011-04-23 NOTE — Telephone Encounter (Signed)
Patient aware of cath instructions.

## 2011-04-30 ENCOUNTER — Other Ambulatory Visit (INDEPENDENT_AMBULATORY_CARE_PROVIDER_SITE_OTHER): Payer: Medicare Other | Admitting: *Deleted

## 2011-04-30 DIAGNOSIS — Z01810 Encounter for preprocedural cardiovascular examination: Secondary | ICD-10-CM

## 2011-04-30 DIAGNOSIS — I2510 Atherosclerotic heart disease of native coronary artery without angina pectoris: Secondary | ICD-10-CM

## 2011-04-30 LAB — CBC WITH DIFFERENTIAL/PLATELET
Basophils Absolute: 0 10*3/uL (ref 0.0–0.1)
Basophils Relative: 0.3 % (ref 0.0–3.0)
Eosinophils Absolute: 0.2 10*3/uL (ref 0.0–0.7)
HCT: 34.5 % — ABNORMAL LOW (ref 39.0–52.0)
Hemoglobin: 11.4 g/dL — ABNORMAL LOW (ref 13.0–17.0)
Lymphocytes Relative: 23.1 % (ref 12.0–46.0)
Lymphs Abs: 2.8 10*3/uL (ref 0.7–4.0)
MCHC: 33.1 g/dL (ref 30.0–36.0)
Monocytes Relative: 6.9 % (ref 3.0–12.0)
Neutro Abs: 8.3 10*3/uL — ABNORMAL HIGH (ref 1.4–7.7)
RBC: 3.85 Mil/uL — ABNORMAL LOW (ref 4.22–5.81)
RDW: 15.2 % — ABNORMAL HIGH (ref 11.5–14.6)

## 2011-04-30 LAB — BASIC METABOLIC PANEL
CO2: 27 mEq/L (ref 19–32)
Calcium: 8.9 mg/dL (ref 8.4–10.5)
Glucose, Bld: 90 mg/dL (ref 70–99)
Potassium: 4.1 mEq/L (ref 3.5–5.1)
Sodium: 139 mEq/L (ref 135–145)

## 2011-05-05 ENCOUNTER — Ambulatory Visit (HOSPITAL_COMMUNITY)
Admission: RE | Admit: 2011-05-05 | Discharge: 2011-05-05 | Disposition: A | Discharge status: Home / Self Care | Payer: Medicare Other | Source: Ambulatory Visit | Attending: Cardiovascular Disease | Admitting: Cardiovascular Disease

## 2011-05-05 DIAGNOSIS — Z9861 Coronary angioplasty status: Secondary | ICD-10-CM | POA: Insufficient documentation

## 2011-05-05 DIAGNOSIS — I2510 Atherosclerotic heart disease of native coronary artery without angina pectoris: Secondary | ICD-10-CM

## 2011-05-05 DIAGNOSIS — I10 Essential (primary) hypertension: Secondary | ICD-10-CM | POA: Insufficient documentation

## 2011-05-05 DIAGNOSIS — E785 Hyperlipidemia, unspecified: Secondary | ICD-10-CM | POA: Insufficient documentation

## 2011-05-05 NOTE — Cardiovascular Report (Signed)
NAME:  [name], [name]                 ACCOUNT NO.:  [id number]  MEDICAL RECORD NO.:  [id number]  LOCATION:  [facility]                         FACILITY:  [facility]  PHYSICIAN:  Verne Carrow, MDDATE OF BIRTH:  [birth year]  DATE OF PROCEDURE:  05/05/2011 DATE OF DISCHARGE:                           CARDIAC CATHETERIZATION   PRIMARY CARE PHYSICIAN:  Cliffton Asters, MD  PROCEDURES PERFORMED: 1. Left heart catheterization. 2. Selective coronary angiography. 3. Left ventricular angiogram.  OPERATOR:  Verne Carrow, MD  INDICATION:  This is a 60 year old Caucasian male with a history of severe coronary artery disease as well as hypertension and hyperlipidemia, who I have followed for the last 3 years in the office. The patient most recently had a cardiac catheterization in November 2009, at which time, there was severe in-stent restenosis in the stented segment of the midvessel of the right coronary artery.  I, at that time, performed balloon angioplasty of the in-stent restenosis as well as an area proximal to the stent.  There was an unfavorable result, so two overlapping drug-eluting stents were placed throughout the proximal and midvessel.  The patient has continued to have some on and off chest pain, but this has increased in severity over the last few weeks. Diagnostic catheterization was arranged for today.  DETAILS OF PROCEDURE:  The patient was brought to the main cardiac catheterization laboratory after signing informed consent for the procedure.  An Freida Busman test was performed on the right wrist and was positive.  The right wrist was prepped and draped in a sterile fashion. Lidocaine 1% was used for local anesthesia.  A 5-French sheath was inserted into the right radial artery without difficulty.  Verapamil 3 mg was given after sheath insertion.  A 5000 units of intravenous heparin was given after sheath insertion.  Standard diagnostic catheters were used to perform  selective coronary angiography.  A pigtail catheter was used to perform a left ventricular angiogram.  The sheath was removed here in the cath lab and a Terumo hemostasis band was applied over the arteriotomy site.  There were no immediate complications.  The patient was taken to the recovery area in stable condition.  HEMODYNAMIC FINDINGS:  Central aortic pressure 87/55.  Left ventricular pressure 88/4/10.  ANGIOGRAPHIC FINDINGS: 1. The left main coronary artery had no evidence of disease. 2. The left anterior descending was a large vessel that coursed to the     apex and gave off 2 diagonal branches.  The first diagonal branch     was small in caliber and had an ostial 90% stenosis.  This was     unchanged from prior catheterization.  The second diagonal branch     had mild plaque disease.  The proximal and mid LAD had mild plaque     disease, but no flow-limiting lesions.  The distal vessel became     tortuous and relatively small in caliber and had what appeared to     be several 50% lesions with some myocardial bridging noted.  This was grossly unchanged from last catheterization. 3. The circumflex artery had serial 40% lesions throughout the     proximal and midportion of the vessel.  The first  obtuse marginal     branch was chronically occluded at the ostium and fills from left-     to-left collaterals.  Second obtuse marginal branch had diffuse 40%     plaque. 4. The right coronary artery is a large dominant vessel with 50%     plaque throughout the proximal portion of the vessel.  There was a     stent present in the mid vessel with 40% in-stent restenosis.     There was a distal stent that is patent with 30% in-stent     restenosis. 5. Left ventricular angiogram was performed in the RAO projection and     showed normal left ventricular systolic function with ejection     fraction of 55%.  IMPRESSION: 1. Stable triple-vessel coronary artery disease with patent stents in      the right coronary artery. 2. Normal left ventricular systolic function.  RECOMMENDATIONS:  At this time, I recommend continued medical management.     Verne Carrow, MD     CM/MEDQ  D:  05/05/2011  T:  05/05/2011  Job:  [id number]  cc:   Cliffton Asters, M.D.  Electronically Signed by Verne Carrow MD on 05/05/2011 02:32:31 PM

## 2011-05-12 NOTE — Progress Notes (Signed)
Addended by: Judithe Modest D on: 05/12/2011 03:53 PM   Modules accepted: Orders

## 2011-05-20 ENCOUNTER — Other Ambulatory Visit: Payer: Self-pay | Admitting: Cardiovascular Disease

## 2011-05-20 LAB — BASIC METABOLIC PANEL
CO2: 27
Chloride: 102
GFR calc Af Amer: >60
Glucose, Bld: 94
Sodium: 134 — ABNORMAL LOW

## 2011-05-20 LAB — CBC
Hemoglobin: 13.1
RDW: 14.1

## 2011-06-02 LAB — CBC
HCT: 37.5 — ABNORMAL LOW
Hemoglobin: 12.7 — ABNORMAL LOW
RDW: 15
WBC: 7.1

## 2011-06-02 LAB — BASIC METABOLIC PANEL
GFR calc non Af Amer: >60
Glucose, Bld: 99
Potassium: 4
Sodium: 138

## 2011-06-10 LAB — CK TOTAL AND CKMB (NOT AT ARMC)
CK, MB: 2.2
Relative Index: INVALID
Total CK: 51

## 2011-06-10 LAB — CBC
Hemoglobin: 11.3 — ABNORMAL LOW
RBC: 3.66 — ABNORMAL LOW
WBC: 6.8

## 2011-06-10 LAB — LIPID PANEL
LDL Cholesterol: 103 — ABNORMAL HIGH
Total CHOL/HDL Ratio: 6.1
Triglycerides: 272 — ABNORMAL HIGH
VLDL: 54 — ABNORMAL HIGH

## 2011-06-10 LAB — BASIC METABOLIC PANEL
Calcium: 9.3
GFR calc Af Amer: >60
GFR calc non Af Amer: >60
Sodium: 139

## 2011-06-11 LAB — AFB CULTURE WITH SMEAR (NOT AT ARMC)

## 2011-06-11 LAB — FUNGUS CULTURE W SMEAR

## 2011-06-11 LAB — CULTURE, RESPIRATORY W GRAM STAIN

## 2011-09-07 ENCOUNTER — Other Ambulatory Visit: Payer: Self-pay

## 2011-09-07 MED ORDER — HYDROCHLOROTHIAZIDE 25 MG PO TABS: 25.0000 mg | ORAL_TABLET | Freq: Every day | ORAL | Status: DC

## 2011-09-07 MED ORDER — LISINOPRIL 40 MG PO TABS: 40.0000 mg | ORAL_TABLET | Freq: Every day | ORAL | Status: DC

## 2011-09-14 ENCOUNTER — Other Ambulatory Visit: Payer: Self-pay

## 2011-09-14 MED ORDER — ISOSORBIDE MONONITRATE ER 30 MG PO TB24: 30.0000 mg | ORAL_TABLET | Freq: Every day | ORAL | Status: DC

## 2011-09-14 MED ORDER — POTASSIUM CHLORIDE ER 10 MEQ PO TBCR: 10.0000 meq | EXTENDED_RELEASE_TABLET | Freq: Two times a day (BID) | ORAL | Status: DC

## 2011-09-14 MED ORDER — CARVEDILOL 12.5 MG PO TABS: 12.5000 mg | ORAL_TABLET | Freq: Two times a day (BID) | ORAL | Status: DC

## 2011-09-16 ENCOUNTER — Telehealth: Payer: Self-pay

## 2011-09-16 DIAGNOSIS — I2510 Atherosclerotic heart disease of native coronary artery without angina pectoris: Secondary | ICD-10-CM

## 2011-09-16 NOTE — Telephone Encounter (Signed)
Refill F/U  Patient wanted to correct the mail order for prescription  Should be [facility]  If there are any additional questions plz contact patient at hm#

## 2011-09-16 NOTE — Telephone Encounter (Signed)
SPOKE WITH PT TODAY HE WANTS TO SPEAK TO DR  MCALHANY   NURSE  BECAUSE HE IS HAVING SOME ISSUES WITH SOB WHICH MAY BE HIS FLUID . I EXPLAINED TO PT THAT DR MCALHANY IS IN THE OFFICE TOMORROW  AND I WILL DISCUSS THIS WITH HIM AND HIS NURSE. THEN WE WILL SEND RX [facility]

## 2011-09-17 MED ORDER — CARVEDILOL 12.5 MG PO TABS: 12.5000 mg | ORAL_TABLET | Freq: Two times a day (BID) | ORAL | Status: DC

## 2011-09-17 MED ORDER — POTASSIUM CHLORIDE ER 10 MEQ PO TBCR: 10.0000 meq | EXTENDED_RELEASE_TABLET | Freq: Two times a day (BID) | ORAL | Status: DC

## 2011-09-17 MED ORDER — LISINOPRIL 40 MG PO TABS: 40.0000 mg | ORAL_TABLET | Freq: Every day | ORAL | Status: DC

## 2011-09-17 MED ORDER — ISOSORBIDE MONONITRATE ER 30 MG PO TB24: 30.0000 mg | ORAL_TABLET | Freq: Every day | ORAL | Status: DC

## 2011-09-17 MED ORDER — HYDROCHLOROTHIAZIDE 25 MG PO TABS: 25.0000 mg | ORAL_TABLET | Freq: Every day | ORAL | Status: DC

## 2011-09-17 MED ORDER — FUROSEMIDE 40 MG PO TABS: 40.0000 mg | ORAL_TABLET | Freq: Every day | ORAL | Status: DC

## 2011-09-17 NOTE — Telephone Encounter (Signed)
Spoke with pt and gave him instructions from Dr. Clifton James. He will come in for lab work either January 24 or 25. Will send prescription to [facility] in [place] and refill other medications to [facility]

## 2011-09-17 NOTE — Telephone Encounter (Signed)
Can we go back to his normal dose of HCTZ and add Lasix 40 mg po Qdaily, BMET in one week. Thanks, chris

## 2011-09-17 NOTE — Telephone Encounter (Signed)
Spoke with pt. He reports he has had swelling in feet/legs which began about 3 months ago. Has also noticed weight gain in last 3 months. Wt now 275 lbs. Was 252 lbs when he saw Dr. Clifton James in August. He states 3 weeks ago he doubled his HCTZ dose for a couple of weeks due to difficulty lying flat when sleeping. This helped and he was able to sleep so is now back on regular HCTZ dose.  Has had recent back problems and been treated with steroids.  He has appt scheduled with Dr. Clifton James on October 15, 2011. I offered him an earlier appt with Dr.McAlhany but he declined. I told him to call if he has increased shortness of breath or swelling prior to appt.

## 2011-09-24 ENCOUNTER — Other Ambulatory Visit (INDEPENDENT_AMBULATORY_CARE_PROVIDER_SITE_OTHER): Payer: Medicare Other | Admitting: *Deleted

## 2011-09-24 DIAGNOSIS — I2510 Atherosclerotic heart disease of native coronary artery without angina pectoris: Secondary | ICD-10-CM

## 2011-09-24 LAB — BASIC METABOLIC PANEL
CO2: 29 mEq/L (ref 19–32)
Chloride: 100 mEq/L (ref 96–112)
Creatinine, Ser: 0.9 mg/dL (ref 0.4–1.5)
Potassium: 3.4 mEq/L — ABNORMAL LOW (ref 3.5–5.1)

## 2011-10-01 ENCOUNTER — Telehealth: Payer: Self-pay | Admitting: Cardiovascular Disease

## 2011-10-01 MED ORDER — POTASSIUM CHLORIDE ER 10 MEQ PO TBCR: 10.0000 meq | EXTENDED_RELEASE_TABLET | Freq: Two times a day (BID) | ORAL | Status: DC

## 2011-10-01 MED ORDER — CARVEDILOL 12.5 MG PO TABS: 12.5000 mg | ORAL_TABLET | Freq: Two times a day (BID) | ORAL | Status: DC

## 2011-10-01 NOTE — Telephone Encounter (Signed)
Patient called stated he needed 90 day supply [facility] for his coreg and klor con.Stated coreg increased to 12.5 mg twice a day and klor con increased to 10 meq twice a day.

## 2011-10-01 NOTE — Telephone Encounter (Signed)
New problem He wants to discuss about his meds to change to quantity to 180 Instead of 90 because he takes two per day The med is carvedilol and klor con please let him know when done. Please call [facility]

## 2011-10-15 ENCOUNTER — Encounter: Payer: Self-pay | Admitting: Cardiovascular Disease

## 2011-10-15 ENCOUNTER — Ambulatory Visit (INDEPENDENT_AMBULATORY_CARE_PROVIDER_SITE_OTHER): Payer: Medicare Other | Admitting: Cardiovascular Disease

## 2011-10-15 VITALS — BP 120/73 | HR 73 | Ht 70.0 in | Wt 278.0 lb

## 2011-10-15 DIAGNOSIS — I509 Heart failure, unspecified: Secondary | ICD-10-CM

## 2011-10-15 DIAGNOSIS — I5032 Chronic diastolic (congestive) heart failure: Secondary | ICD-10-CM | POA: Insufficient documentation

## 2011-10-15 DIAGNOSIS — I2510 Atherosclerotic heart disease of native coronary artery without angina pectoris: Secondary | ICD-10-CM

## 2011-10-15 MED ORDER — FUROSEMIDE 40 MG PO TABS: 40.0000 mg | ORAL_TABLET | Freq: Two times a day (BID) | ORAL | Status: DC

## 2011-10-15 NOTE — Progress Notes (Signed)
History of Present Illness: 61 yo WM with history of CAD s/p two drug eluting stents to RCA 11/09, HTN, OA here today for routine follow up.  He was seen in our office in December 2010 for risk assessment prior to left rotator cuff surgery. His Plavix was stopped at the end of 2010. I last saw him in the office in August 2012 and he called back c/o chest pain. I arranged a left heart cath on 05/05/11. He was found to have stable CAD.   He is here today for scheduled follow up. He has stable chest pains. No changes in frequency or intensity. No palpitations, near syncope, syncope, orthopnea or PND. His right foot and ankle is severely painful. He had a prior fall and has had fusion of the ankle. He does note that his weight is up some. Some LE edema, bilateral.   Primary Care Physician: Dr. Desmond Dike at [facility].  His cholesterol is followed in primary care.   Last Lipid Profile: Not in our system. Well controlled per pt.   Past Medical History  Diagnosis Date  . Arthritis   . CAD (coronary artery disease)     s/p prior PCI  . Hypertension     Past Surgical History  Procedure Date  . Carpal tunnel release     Bilateral  . Rotator cuff repair   . Total knee arthroplasty     Bilateral  . Foot surgery     Right  . Ankle surgery     Fusion  . Back surgery     x3  . Pacemaker insertion     Greenfield filter  . Thoracotomy     For access to a T10-12 fusion complicated by hemothorax and 44 day hospitalization  . Coronary angioplasty with stent placement     Stenting of the RCA as well as other stenting procedures in the past    Current Outpatient Prescriptions  Medication Sig Dispense Refill  . aspirin 81 MG tablet Take 81 mg by mouth daily.        . carvedilol (COREG) 12.5 MG tablet Take 1 tablet (12.5 mg total) by mouth 2 (two) times daily.  180 tablet  3  . doxycycline (DORYX) 100 MG DR capsule Take 100 mg by mouth 2 (two) times daily.        . furosemide  (LASIX) 40 MG tablet Take 1 tablet (40 mg total) by mouth daily.  30 tablet  11  . hydrochlorothiazide (HYDRODIURIL) 25 MG tablet Take 1 tablet (25 mg total) by mouth daily.  90 tablet  3  . isosorbide mononitrate (IMDUR) 30 MG 24 hr tablet Take 1 tablet (30 mg total) by mouth daily.  90 tablet  3  . lisinopril (PRINIVIL,ZESTRIL) 40 MG tablet Take 1 tablet (40 mg total) by mouth daily.  90 tablet  3  . Morphine Sulfate (MS CONTIN PO) Take 30 mg by mouth 3 (three) times daily.        Marland Kitchen NITROSTAT 0.4 MG SL tablet ONE TABLET UNDER TONGUE EVERY 5 MINUTES AS NEEDED FOR CHEST PAIN **MAY REPEAT UP TO 3 TIMES**  25 tablet  4  . oxyCODONE-acetaminophen (PERCOCET) 10-325 MG per tablet Take 1 tablet by mouth every 4 (four) hours as needed.       Marland Kitchen oxyCODONE-acetaminophen (PERCOCET) 10-325 MG per tablet Take 1 tablet by mouth 3 (three) times daily.      . potassium chloride (KLOR-CON 10) 10 MEQ tablet Take  1 tablet (10 mEq total) by mouth 2 (two) times daily.  180 tablet  1  . pravastatin (PRAVACHOL) 80 MG tablet TAKE 1 TABLET DAILY AT BEDTIME  90 tablet  2  . testosterone cypionate (DEPOTESTOTERONE CYPIONATE) 200 MG/ML injection 1 injection every three weeks        No Known Allergies  History   Social History  . Marital Status: Married    Spouse Name: N/A    Number of Children: N/A  . Years of Education: N/A   Occupational History  . disability    Social History Main Topics  . Smoking status: Former Games developer  . Smokeless tobacco: Not on file   Comment: quit 35 years ago  . Alcohol Use: No  . Drug Use: No  . Sexually Active: Not on file   Other Topics Concern  . Not on file   Social History Narrative   Married with 3 children    Family History  Problem Relation Age of Onset  . Heart attack Mother   . Coronary artery disease Mother   . Lung disease Father     Review of Systems:  As stated in the HPI and otherwise negative.   BP 120/73  Pulse 73  Ht 5\' 10"  (1.778 m)  Wt 278 lb  (126.1 kg)  BMI 39.89 kg/m2  Physical Examination: General: Well developed, well nourished, NAD HEENT: OP clear, mucus membranes moist SKIN: warm, dry. No rashes. Neuro: No focal deficits Musculoskeletal: Muscle strength 5/5 all ext Psychiatric: Mood and affect normal Neck: No JVD, no carotid bruits, no thyromegaly, no lymphadenopathy. Lungs:Clear bilaterally, no wheezes, rhonci, crackles Cardiovascular: Regular rate and rhythm. No murmurs, gallops or rubs. Abdomen:Soft. Bowel sounds present. Non-tender.  Extremities: No lower extremity edema. Pulses are 2 + in the bilateral DP/PT.  Cardiac Cath: 05/05/11: 1. The left main coronary artery had no evidence of disease.   2. The left anterior descending was a large vessel that coursed to the       apex and gave off 2 diagonal branches.  The first diagonal branch       was small in caliber and had an ostial 90% stenosis.  This was       unchanged from prior catheterization.  The second diagonal branch       had mild plaque disease.  The proximal and mid LAD had mild plaque       disease, but no flow-limiting lesions.  The distal vessel became       tortuous and relatively small in caliber and had what appeared to       be several 50% lesions with some myocardial bridging noted.  This was grossly unchanged from last catheterization.   3. The circumflex artery had serial 40% lesions throughout the       proximal and midportion of the vessel.  The first obtuse marginal       branch was chronically occluded at the ostium and fills from left-       to-left collaterals.  Second obtuse marginal branch had diffuse 40%       plaque.   4. The right coronary artery is a large dominant vessel with 50%       plaque throughout the proximal portion of the vessel.  There was a       stent present in the mid vessel with 40% in-stent restenosis.       There was a distal stent that is patent with 30% in-stent  restenosis.   5. Left ventricular  angiogram was performed in the RAO projection and       showed normal left ventricular systolic function with ejection       fraction of 55%.

## 2011-10-15 NOTE — Assessment & Plan Note (Signed)
Stable. Last cath September 2012 with moderate disease. Continue current medical management. Lipids followed in primary care. BP is well controlled.

## 2011-10-15 NOTE — Patient Instructions (Signed)
Your physician wants you to follow-up in:  6 months. You will receive a reminder letter in the mail two months in advance. If you don't receive a letter, please call our office to schedule the follow-up appointment.  Your physician has recommended you make the following change in your medication:  Increase furosemide to 40 mg by mouth twice daily   

## 2011-10-15 NOTE — Assessment & Plan Note (Signed)
Will increase Lasix to 40 mg po BID. He will continue potassium supplementation. He will eat a banana every day.

## 2011-10-16 ENCOUNTER — Telehealth: Payer: Self-pay | Admitting: Cardiovascular Disease

## 2011-10-16 NOTE — Telephone Encounter (Signed)
The pt forgot to mention yesterday during his appointment that he needs to have a screening colonoscopy.  The pt would like to know if he can have a colonoscopy.  I will forward this information to Dr Clifton James.  The pt also said to let MD know that he looked at jeep and said "What's a old man like him doing in a young man's car."

## 2011-10-16 NOTE — Telephone Encounter (Signed)
Left message for pt to call back  °

## 2011-10-16 NOTE — Telephone Encounter (Signed)
New Msg: Pt calling wanting to speak with nurse/MD about pt getting surgical clearance for pt to have colonoscopy. Please return pt call to discuss further.

## 2011-10-19 ENCOUNTER — Telehealth: Payer: Self-pay | Admitting: *Deleted

## 2011-10-19 NOTE — Telephone Encounter (Signed)
Ok for pt to have colonoscopy from cardiac standpoint. Can we let him know? Thanks, chris

## 2011-10-19 NOTE — Telephone Encounter (Signed)
10/19/11--called [name] and let him know i was sending document clearing him for colonoscopy--pt agrees--nt

## 2012-02-18 ENCOUNTER — Other Ambulatory Visit: Payer: Self-pay

## 2012-02-18 MED ORDER — PRAVASTATIN SODIUM 80 MG PO TABS: 80.0000 mg | ORAL_TABLET | Freq: Every day | ORAL | Status: DC

## 2012-05-12 ENCOUNTER — Ambulatory Visit: Payer: Medicare Other | Admitting: Cardiovascular Disease

## 2012-05-12 ENCOUNTER — Encounter: Payer: Self-pay | Admitting: Cardiovascular Disease

## 2012-06-10 ENCOUNTER — Encounter: Payer: Self-pay | Admitting: Cardiovascular Disease

## 2012-06-10 ENCOUNTER — Ambulatory Visit (INDEPENDENT_AMBULATORY_CARE_PROVIDER_SITE_OTHER): Payer: Medicare Other | Admitting: Cardiovascular Disease

## 2012-06-10 VITALS — BP 134/84 | HR 59 | Ht 71.0 in | Wt 271.0 lb

## 2012-06-10 DIAGNOSIS — I5032 Chronic diastolic (congestive) heart failure: Secondary | ICD-10-CM

## 2012-06-10 DIAGNOSIS — I2510 Atherosclerotic heart disease of native coronary artery without angina pectoris: Secondary | ICD-10-CM

## 2012-06-10 DIAGNOSIS — I509 Heart failure, unspecified: Secondary | ICD-10-CM

## 2012-06-10 NOTE — Patient Instructions (Addendum)
Your physician wants you to follow-up in:  12 months.  You will receive a reminder letter in the mail two months in advance. If you don't receive a letter, please call our office to schedule the follow-up appointment.   

## 2012-06-10 NOTE — Progress Notes (Signed)
History of Present Illness: 61 yo WM with history of CAD s/p two drug eluting stents to RCA 11/09, HTN, OA here today for routine follow up.  His Plavix was stopped at the end of 2010. I last saw him in the office in February 2013. He had c/o chest pain in August 2012. I arranged a left heart cath on 05/05/11. He was found to have stable CAD.   He is here today for scheduled follow up. He notes improvement in his chest pains. Breathing is ok.  No palpitations, near syncope, syncope, orthopnea or PND. His right foot and ankle are severely painful. He had a prior fall and has had fusion of the ankle.   Primary Care Physician: Dr. Desmond Dike at [facility]. His cholesterol is followed in primary care.   Last Lipid Profile: Followed in primary care.    Past Medical History  Diagnosis Date  . Arthritis   . CAD (coronary artery disease)     s/p prior PCI  . Hypertension   . Chronic diastolic CHF (congestive heart failure)     Past Surgical History  Procedure Date  . Carpal tunnel release     Bilateral  . Rotator cuff repair   . Total knee arthroplasty     Bilateral  . Foot surgery     Right  . Ankle surgery     Fusion  . Back surgery     x3  . Pacemaker insertion     Greenfield filter  . Thoracotomy     For access to a T10-12 fusion complicated by hemothorax and 44 day hospitalization  . Coronary angioplasty with stent placement     Stenting of the RCA as well as other stenting procedures in the past    Current Outpatient Prescriptions  Medication Sig Dispense Refill  . aspirin 81 MG tablet Take 81 mg by mouth daily.        . carvedilol (COREG) 12.5 MG tablet Take 1 tablet (12.5 mg total) by mouth 2 (two) times daily.  180 tablet  3  . doxycycline (DORYX) 100 MG DR capsule Take 100 mg by mouth 2 (two) times daily.        . furosemide (LASIX) 40 MG tablet Take 1 tablet (40 mg total) by mouth 2 (two) times daily.  60 tablet  11  . hydrochlorothiazide  (HYDRODIURIL) 25 MG tablet Take 1 tablet (25 mg total) by mouth daily.  90 tablet  3  . isosorbide mononitrate (IMDUR) 30 MG 24 hr tablet Take 1 tablet (30 mg total) by mouth daily.  90 tablet  3  . lisinopril (PRINIVIL,ZESTRIL) 40 MG tablet Take 1 tablet (40 mg total) by mouth daily.  90 tablet  3  . Morphine Sulfate (MS CONTIN PO) Take 30 mg by mouth 3 (three) times daily.        Marland Kitchen NITROSTAT 0.4 MG SL tablet ONE TABLET UNDER TONGUE EVERY 5 MINUTES AS NEEDED FOR CHEST PAIN **MAY REPEAT UP TO 3 TIMES**  25 tablet  4  . oxyCODONE-acetaminophen (PERCOCET) 10-325 MG per tablet Take 1 tablet by mouth every 4 (four) hours as needed.       Marland Kitchen oxyCODONE-acetaminophen (PERCOCET) 10-325 MG per tablet Take 1 tablet by mouth 3 (three) times daily.      . potassium chloride (KLOR-CON 10) 10 MEQ tablet Take 1 tablet (10 mEq total) by mouth 2 (two) times daily.  180 tablet  1  . pravastatin (PRAVACHOL) 80 MG  tablet Take 1 tablet (80 mg total) by mouth daily.  90 tablet  3  . testosterone cypionate (DEPOTESTOTERONE CYPIONATE) 200 MG/ML injection 1 injection every three weeks        No Known Allergies  History   Social History  . Marital Status: Married    Spouse Name: N/A    Number of Children: N/A  . Years of Education: N/A   Occupational History  . disability    Social History Main Topics  . Smoking status: Former Games developer  . Smokeless tobacco: Not on file   Comment: quit 35 years ago  . Alcohol Use: No  . Drug Use: No  . Sexually Active: Not on file   Other Topics Concern  . Not on file   Social History Narrative   Married with 3 children    Family History  Problem Relation Age of Onset  . Heart attack Mother   . Coronary artery disease Mother   . Lung disease Father     Review of Systems:  As stated in the HPI and otherwise negative.   BP 134/84  Pulse 59  Ht 5\' 11"  (1.803 m)  Wt 271 lb (122.925 kg)  BMI 37.80 kg/m2  Physical Examination: General: Well developed, well  nourished, NAD HEENT: OP clear, mucus membranes moist SKIN: warm, dry. No rashes. Neuro: No focal deficits Musculoskeletal: Muscle strength 5/5 all ext Psychiatric: Mood and affect normal Neck: No JVD, no carotid bruits, no thyromegaly, no lymphadenopathy. Lungs:Clear bilaterally, no wheezes, rhonci, crackles Cardiovascular: Regular rate and rhythm. No murmurs, gallops or rubs. Abdomen:Soft. Bowel sounds present. Non-tender.  Extremities: No lower extremity edema. Pulses are 2 + in the bilateral DP/PT.  EKG: Sinus brady, rate 59 bpm. PVC.   1. Cardiac Cath: 05/05/11:  1. The left main coronary artery had no evidence of disease.  2. The left anterior descending was a large vessel that coursed to the  apex and gave off 2 diagonal branches. The first diagonal branch  was small in caliber and had an ostial 90% stenosis. This was  unchanged from prior catheterization. The second diagonal branch  had mild plaque disease. The proximal and mid LAD had mild plaque  disease, but no flow-limiting lesions. The distal vessel became  tortuous and relatively small in caliber and had what appeared to  be several 50% lesions with some myocardial bridging noted. This was grossly unchanged from last catheterization.  3. The circumflex artery had serial 40% lesions throughout the  proximal and midportion of the vessel. The first obtuse marginal  branch was chronically occluded at the ostium and fills from left-  to-left collaterals. Second obtuse marginal branch had diffuse 40%  plaque.  4. The right coronary artery is a large dominant vessel with 50%  plaque throughout the proximal portion of the vessel. There was a  stent present in the mid vessel with 40% in-stent restenosis.  There was a distal stent that is patent with 30% in-stent  restenosis.  5. Left ventricular angiogram was performed in the RAO projection and  showed normal left ventricular systolic function with ejection  fraction of 55%.     Assessment and Plan:   1. CAD: Stable. Last cath September 2012 with moderate disease. Continue current medical management. Lipids followed in primary care. BP is well controlled.   2. Diastolic CHF, chronic: Volume status is ok.  Continue Lasix as needed. He will continue potassium supplementation.

## 2012-08-30 ENCOUNTER — Other Ambulatory Visit: Payer: Self-pay

## 2012-08-30 MED ORDER — LISINOPRIL 40 MG PO TABS: 40.0000 mg | ORAL_TABLET | Freq: Every day | ORAL | Status: DC

## 2012-08-30 MED ORDER — ISOSORBIDE MONONITRATE ER 30 MG PO TB24: 30.0000 mg | ORAL_TABLET | Freq: Every day | ORAL | Status: DC

## 2012-08-30 MED ORDER — POTASSIUM CHLORIDE ER 10 MEQ PO TBCR: 10.0000 meq | EXTENDED_RELEASE_TABLET | Freq: Two times a day (BID) | ORAL | Status: DC

## 2012-09-05 ENCOUNTER — Other Ambulatory Visit: Payer: Self-pay | Admitting: *Deleted

## 2012-09-05 ENCOUNTER — Telehealth: Payer: Self-pay | Admitting: Cardiovascular Disease

## 2012-09-05 MED ORDER — ISOSORBIDE MONONITRATE ER 30 MG PO TB24: 30.0000 mg | ORAL_TABLET | Freq: Every day | ORAL | Status: DC

## 2012-09-05 MED ORDER — LISINOPRIL 40 MG PO TABS: 40.0000 mg | ORAL_TABLET | Freq: Every day | ORAL | Status: DC

## 2012-09-05 MED ORDER — HYDROCHLOROTHIAZIDE 25 MG PO TABS: 25.0000 mg | ORAL_TABLET | Freq: Every day | ORAL | Status: DC

## 2012-09-05 NOTE — Telephone Encounter (Signed)
New problem:    Lisinopril  40 mg, isosribide 30 mg.  hctz 25 mg    [phone] [facility].

## 2012-11-01 ENCOUNTER — Other Ambulatory Visit: Payer: Self-pay | Admitting: *Deleted

## 2012-11-01 MED ORDER — CARVEDILOL 12.5 MG PO TABS: 12.5000 mg | ORAL_TABLET | Freq: Two times a day (BID) | ORAL | Status: DC

## 2012-11-01 NOTE — Telephone Encounter (Signed)
Calling patient back from Refill answering machine to verify pharmacy. Carvedilol 12.5 bid, #180, 1 refill to [facility], [facility]

## 2012-11-14 ENCOUNTER — Other Ambulatory Visit: Payer: Self-pay | Admitting: *Deleted

## 2012-11-14 MED ORDER — NITROGLYCERIN 0.4 MG SL SUBL: SUBLINGUAL_TABLET | SUBLINGUAL | Status: DC

## 2013-03-16 ENCOUNTER — Other Ambulatory Visit: Payer: Self-pay

## 2013-03-16 MED ORDER — POTASSIUM CHLORIDE ER 10 MEQ PO TBCR: 10.0000 meq | EXTENDED_RELEASE_TABLET | Freq: Two times a day (BID) | ORAL | Status: DC

## 2013-03-16 MED ORDER — PRAVASTATIN SODIUM 80 MG PO TABS: 80.0000 mg | ORAL_TABLET | Freq: Every day | ORAL | Status: DC

## 2013-03-16 NOTE — Telephone Encounter (Signed)
Refill request for KLOR-CON & Pravastatin from [facility] for 90 day supplies. OK per Burnett Kanaris to refill.

## 2013-06-12 ENCOUNTER — Telehealth: Payer: Self-pay | Admitting: Cardiovascular Disease

## 2013-06-12 NOTE — Telephone Encounter (Signed)
New message   Pt called to schedule recall appt--He said he did not make an appt for 06-10-13 and do not want to be charged for that appt

## 2013-06-23 ENCOUNTER — Other Ambulatory Visit: Payer: Self-pay

## 2013-06-23 MED ORDER — CARVEDILOL 12.5 MG PO TABS: 12.5000 mg | ORAL_TABLET | Freq: Two times a day (BID) | ORAL | Status: DC

## 2013-07-21 ENCOUNTER — Encounter (INDEPENDENT_AMBULATORY_CARE_PROVIDER_SITE_OTHER): Payer: Self-pay

## 2013-07-21 ENCOUNTER — Ambulatory Visit (INDEPENDENT_AMBULATORY_CARE_PROVIDER_SITE_OTHER): Payer: Medicare Other | Admitting: Cardiovascular Disease

## 2013-07-21 ENCOUNTER — Encounter: Payer: Self-pay | Admitting: Cardiovascular Disease

## 2013-07-21 VITALS — BP 148/98 | HR 59 | Ht 71.0 in | Wt 265.0 lb

## 2013-07-21 DIAGNOSIS — I509 Heart failure, unspecified: Secondary | ICD-10-CM

## 2013-07-21 DIAGNOSIS — I2510 Atherosclerotic heart disease of native coronary artery without angina pectoris: Secondary | ICD-10-CM

## 2013-07-21 DIAGNOSIS — I5032 Chronic diastolic (congestive) heart failure: Secondary | ICD-10-CM

## 2013-07-21 NOTE — Progress Notes (Signed)
History of Present Illness: 62 yo WM with history of CAD s/p two drug eluting stents to RCA 11/09, HTN, OA here today for routine follow up.  His Plavix was stopped at the end of 2010. He had c/o chest pain in August 2012. I arranged a left heart cath on 05/05/11. He was found to have stable CAD.   He is here today for scheduled follow up. He notes improvement in his chest pains. Breathing is ok.  No palpitations, near syncope, syncope, orthopnea or PND. His right foot and ankle are severely painful. He had a prior fall and has had fusion of the ankle.   Primary Care Physician: Dr. Desmond Dike at [facility]. His cholesterol is followed in primary care.   Last Lipid Profile: Followed in primary care.    Past Medical History  Diagnosis Date  . Arthritis   . CAD (coronary artery disease)     s/p prior PCI  . Hypertension   . Chronic diastolic CHF (congestive heart failure)     Past Surgical History  Procedure Laterality Date  . Carpal tunnel release      Bilateral  . Rotator cuff repair    . Total knee arthroplasty      Bilateral  . Foot surgery      Right  . Ankle surgery      Fusion  . Back surgery      x3  . Pacemaker insertion      Greenfield filter  . Thoracotomy      For access to a T10-12 fusion complicated by hemothorax and 44 day hospitalization  . Coronary angioplasty with stent placement      Stenting of the RCA as well as other stenting procedures in the past    Current Outpatient Prescriptions  Medication Sig Dispense Refill  . aspirin 81 MG tablet Take 81 mg by mouth daily.        . carvedilol (COREG) 12.5 MG tablet Take 1 tablet (12.5 mg total) by mouth 2 (two) times daily.  180 tablet  0  . doxycycline (DORYX) 100 MG DR capsule Take 100 mg by mouth 2 (two) times daily.        . hydrochlorothiazide (HYDRODIURIL) 25 MG tablet Take 1 tablet (25 mg total) by mouth daily.  90 tablet  3  . isosorbide mononitrate (IMDUR) 30 MG 24 hr tablet  Take 1 tablet (30 mg total) by mouth daily.  90 tablet  3  . lisinopril (PRINIVIL,ZESTRIL) 40 MG tablet Take 1 tablet (40 mg total) by mouth daily.  90 tablet  3  . nitroGLYCERIN (NITROSTAT) 0.4 MG SL tablet ONE TABLET UNDER TONGUE EVERY 5 MINUTES AS NEEDED FOR CHEST PAIN **MAY REPEAT UP TO 3 TIMES**  25 tablet  4  . oxyCODONE-acetaminophen (PERCOCET) 10-650 MG per tablet Take 1 tablet by mouth every 4 (four) hours as needed.      . potassium chloride (KLOR-CON 10) 10 MEQ tablet Take 1 tablet (10 mEq total) by mouth 2 (two) times daily.  180 tablet  2  . pravastatin (PRAVACHOL) 80 MG tablet Take 1 tablet (80 mg total) by mouth daily.  90 tablet  2   No current facility-administered medications for this visit.    No Known Allergies  History   Social History  . Marital Status: Married    Spouse Name: N/A    Number of Children: N/A  . Years of Education: N/A   Occupational History  .  disability    Social History Main Topics  . Smoking status: Former Games developer  . Smokeless tobacco: Not on file     Comment: quit 35 years ago  . Alcohol Use: No  . Drug Use: No  . Sexual Activity: Not on file   Other Topics Concern  . Not on file   Social History Narrative   Married with 3 children    Family History  Problem Relation Age of Onset  . Heart attack Mother   . Coronary artery disease Mother   . Lung disease Father     Review of Systems:  As stated in the HPI and otherwise negative.   BP 148/98  Pulse 59  Ht 5\' 11"  (1.803 m)  Wt 265 lb (120.203 kg)  BMI 36.98 kg/m2  Physical Examination: General: Well developed, well nourished, NAD HEENT: OP clear, mucus membranes moist SKIN: warm, dry. No rashes. Neuro: No focal deficits Musculoskeletal: Muscle strength 5/5 all ext Psychiatric: Mood and affect normal Neck: No JVD, no carotid bruits, no thyromegaly, no lymphadenopathy. Lungs:Clear bilaterally, no wheezes, rhonci, crackles Cardiovascular: Regular rate and rhythm. No  murmurs, gallops or rubs. Abdomen:Soft. Bowel sounds present. Non-tender.  Extremities: No lower extremity edema. Pulses are 2 + in the bilateral DP/PT.  EKG: Sinus brady, rate 59 bpm. PVC.  1. Cardiac Cath: 05/05/11:  1. The left main coronary artery had no evidence of disease.  2. The left anterior descending was a large vessel that coursed to the  apex and gave off 2 diagonal branches. The first diagonal branch  was small in caliber and had an ostial 90% stenosis. This was  unchanged from prior catheterization. The second diagonal branch  had mild plaque disease. The proximal and mid LAD had mild plaque  disease, but no flow-limiting lesions. The distal vessel became  tortuous and relatively small in caliber and had what appeared to  be several 50% lesions with some myocardial bridging noted. This was grossly unchanged from last catheterization.  3. The circumflex artery had serial 40% lesions throughout the  proximal and midportion of the vessel. The first obtuse marginal  branch was chronically occluded at the ostium and fills from left-  to-left collaterals. Second obtuse marginal branch had diffuse 40%  plaque.  4. The right coronary artery is a large dominant vessel with 50%  plaque throughout the proximal portion of the vessel. There was a  stent present in the mid vessel with 40% in-stent restenosis.  There was a distal stent that is patent with 30% in-stent  restenosis.  5. Left ventricular angiogram was performed in the RAO projection and  showed normal left ventricular systolic function with ejection  fraction of 55%.   Assessment and Plan:   1. CAD: Stable. Last cath September 2012 with moderate disease. Continue current medical management. Lipids followed in primary care. BP is well controlled.   2. Diastolic CHF, chronic: Volume status is ok.  Continue Lasix as needed. He will continue potassium supplementation.

## 2013-07-21 NOTE — Patient Instructions (Signed)
Your physician wants you to follow-up in:  6 months. You will receive a reminder letter in the mail two months in advance. If you don't receive a letter, please call our office to schedule the follow-up appointment.   

## 2013-08-15 ENCOUNTER — Other Ambulatory Visit: Payer: Self-pay

## 2013-08-15 MED ORDER — HYDROCHLOROTHIAZIDE 25 MG PO TABS: 25.0000 mg | ORAL_TABLET | Freq: Every day | ORAL | Status: DC

## 2013-10-31 ENCOUNTER — Telehealth: Payer: Self-pay | Admitting: Cardiovascular Disease

## 2013-10-31 NOTE — Telephone Encounter (Signed)
C/O  CP  off and on for greater than a month, used nitro once without helping sx. Denies pain now. Pt states he does not need to be seen today. Pt states his wife was just DX with cancer/ extra stress. Dr has available app tomorrow, pt accepted, was told to go to Ed with further CP, pt verbalized understanding.

## 2013-10-31 NOTE — Telephone Encounter (Signed)
New Message  Pt called states that he has had chest pains and breathing problems spontaneously.Marland Kitchen. He wakes up with numb arms and he states that it takes a while to retrieve circulation/// pt is requesting a call back to assist.

## 2013-11-01 ENCOUNTER — Encounter: Payer: Self-pay | Admitting: Cardiovascular Disease

## 2013-11-01 ENCOUNTER — Ambulatory Visit (INDEPENDENT_AMBULATORY_CARE_PROVIDER_SITE_OTHER): Payer: Medicare Other | Admitting: Cardiovascular Disease

## 2013-11-01 VITALS — BP 119/74 | HR 94 | Ht 71.0 in | Wt 266.0 lb

## 2013-11-01 DIAGNOSIS — I509 Heart failure, unspecified: Secondary | ICD-10-CM

## 2013-11-01 DIAGNOSIS — R079 Chest pain, unspecified: Secondary | ICD-10-CM

## 2013-11-01 DIAGNOSIS — I5032 Chronic diastolic (congestive) heart failure: Secondary | ICD-10-CM

## 2013-11-01 DIAGNOSIS — I2510 Atherosclerotic heart disease of native coronary artery without angina pectoris: Secondary | ICD-10-CM

## 2013-11-01 NOTE — Telephone Encounter (Signed)
Pt had office visit today with Dr. Clifton JamesMcAlhany

## 2013-11-01 NOTE — Progress Notes (Signed)
History of Present Illness: 63 yo WM with history of CAD s/p two drug eluting stents to RCA 11/09, HTN, OA here today for routine follow up.  His Plavix was stopped at the end of 2010. He had c/o chest pain in August 2012. I arranged a left heart cath on 05/05/11. He was found to have stable CAD.   He is here today for follow up. He has had recent chest pains. These are usually sharp but he did have chest pressure yesterday. His breathing has worsened over the last 3 months. No palpitations, near syncope, syncope, orthopnea or PND. His right foot and ankle are severely painful. He had a prior fall and has had fusion of the ankle.   Primary Care Physician: Dr. Desmond Dike at [facility]. His cholesterol is followed in primary care.   Last Lipid Profile: Followed in primary care.    Past Medical History  Diagnosis Date  . Arthritis   . CAD (coronary artery disease)     s/p prior PCI  . Hypertension   . Chronic diastolic CHF (congestive heart failure)     Past Surgical History  Procedure Laterality Date  . Carpal tunnel release      Bilateral  . Rotator cuff repair    . Total knee arthroplasty      Bilateral  . Foot surgery      Right  . Ankle surgery      Fusion  . Back surgery      x3  . Pacemaker insertion      Greenfield filter  . Thoracotomy      For access to a T10-12 fusion complicated by hemothorax and 44 day hospitalization  . Coronary angioplasty with stent placement      Stenting of the RCA as well as other stenting procedures in the past    Current Outpatient Prescriptions  Medication Sig Dispense Refill  . aspirin 81 MG tablet Take 81 mg by mouth daily.        . carvedilol (COREG) 12.5 MG tablet Take 1 tablet (12.5 mg total) by mouth 2 (two) times daily.  180 tablet  0  . doxycycline (DORYX) 100 MG DR capsule Take 100 mg by mouth 2 (two) times daily.        . fentaNYL (DURAGESIC - DOSED MCG/HR) 75 MCG/HR Place 75 mcg onto the skin every 3  (three) days.      . furosemide (LASIX) 20 MG tablet Take 20 mg by mouth as needed.      . hydrochlorothiazide (HYDRODIURIL) 25 MG tablet Take 1 tablet (25 mg total) by mouth daily.  90 tablet  1  . HYDROcodone-acetaminophen (NORCO) 10-325 MG per tablet Take by mouth.       . isosorbide mononitrate (IMDUR) 30 MG 24 hr tablet Take 1 tablet (30 mg total) by mouth daily.  90 tablet  3  . lisinopril (PRINIVIL,ZESTRIL) 40 MG tablet Take 1 tablet (40 mg total) by mouth daily.  90 tablet  3  . nitroGLYCERIN (NITROSTAT) 0.4 MG SL tablet ONE TABLET UNDER TONGUE EVERY 5 MINUTES AS NEEDED FOR CHEST PAIN **MAY REPEAT UP TO 3 TIMES**  25 tablet  4  . potassium chloride (KLOR-CON 10) 10 MEQ tablet Take 1 tablet (10 mEq total) by mouth 2 (two) times daily.  180 tablet  2  . pravastatin (PRAVACHOL) 80 MG tablet Take 1 tablet (80 mg total) by mouth daily.  90 tablet  2   No  current facility-administered medications for this visit.    No Known Allergies  History   Social History  . Marital Status: Married    Spouse Name: N/A    Number of Children: N/A  . Years of Education: N/A   Occupational History  . disability    Social History Main Topics  . Smoking status: Former Games developermoker  . Smokeless tobacco: Not on file     Comment: quit 35 years ago  . Alcohol Use: No  . Drug Use: No  . Sexual Activity: Not on file   Other Topics Concern  . Not on file   Social History Narrative   Married with 3 children    Family History  Problem Relation Age of Onset  . Heart attack Mother   . Coronary artery disease Mother   . Lung disease Father     Review of Systems:  As stated in the HPI and otherwise negative.   BP 119/74  Pulse 94  Ht 5\' 11"  (1.803 m)  Wt 266 lb (120.657 kg)  BMI 37.12 kg/m2  Physical Examination: General: Well developed, well nourished, NAD HEENT: OP clear, mucus membranes moist SKIN: warm, dry. No rashes. Neuro: No focal deficits Musculoskeletal: Muscle strength 5/5 all  ext Psychiatric: Mood and affect normal Neck: No JVD, no carotid bruits, no thyromegaly, no lymphadenopathy. Lungs:Clear bilaterally, no wheezes, rhonci, crackles Cardiovascular: Regular rate and rhythm. No murmurs, gallops or rubs. Abdomen:Soft. Bowel sounds present. Non-tender.  Extremities: No lower extremity edema. Pulses are 2 + in the bilateral DP/PT.  EKG: Sinus brady, rate 59 bpm. PVC.  1. Cardiac Cath: 05/05/11:  1. The left main coronary artery had no evidence of disease.  2. The left anterior descending was a large vessel that coursed to the  apex and gave off 2 diagonal branches. The first diagonal branch  was small in caliber and had an ostial 90% stenosis. This was  unchanged from prior catheterization. The second diagonal branch  had mild plaque disease. The proximal and mid LAD had mild plaque  disease, but no flow-limiting lesions. The distal vessel became  tortuous and relatively small in caliber and had what appeared to  be several 50% lesions with some myocardial bridging noted. This was grossly unchanged from last catheterization.  3. The circumflex artery had serial 40% lesions throughout the  proximal and midportion of the vessel. The first obtuse marginal  branch was chronically occluded at the ostium and fills from left-  to-left collaterals. Second obtuse marginal branch had diffuse 40%  plaque.  4. The right coronary artery is a large dominant vessel with 50%  plaque throughout the proximal portion of the vessel. There was a  stent present in the mid vessel with 40% in-stent restenosis.  There was a distal stent that is patent with 30% in-stent  restenosis.  5. Left ventricular angiogram was performed in the RAO projection and  showed normal left ventricular systolic function with ejection  fraction of 55%.  EKG: NSR, rate 67 bpm.    Assessment and Plan:   1. Chest pain: Recent chest pain, known CAD. Concerning for unstable angina. Will plan Lexiscan  stress myoview to exclude ischemia. I will see him back in several weeks to go over the stress test.   2. CAD: As above. Last cath September 2012 with moderate disease in the RCA, Circumflex and LAD. Continue current medical management. Lipids followed in primary care. BP is well controlled.   3. Diastolic CHF, chronic: Volume status  is ok.  Continue Lasix as needed.

## 2013-11-01 NOTE — Telephone Encounter (Signed)
Agree. cdm 

## 2013-11-01 NOTE — Patient Instructions (Signed)
Your physician recommends that you schedule a follow-up appointment in: 3 weeks.   Your physician has requested that you have a lexiscan myoview. For further information please visit [email or web address] Please follow instruction sheet, as given.

## 2013-11-07 ENCOUNTER — Encounter: Payer: Self-pay | Admitting: Cardiology

## 2013-11-07 ENCOUNTER — Ambulatory Visit (HOSPITAL_COMMUNITY): Payer: Medicare Other | Attending: Cardiology | Admitting: Radiology

## 2013-11-07 VITALS — BP 113/52 | Ht 71.0 in | Wt 267.0 lb

## 2013-11-07 DIAGNOSIS — R079 Chest pain, unspecified: Secondary | ICD-10-CM | POA: Insufficient documentation

## 2013-11-07 DIAGNOSIS — Z8249 Family history of ischemic heart disease and other diseases of the circulatory system: Secondary | ICD-10-CM | POA: Insufficient documentation

## 2013-11-07 DIAGNOSIS — I10 Essential (primary) hypertension: Secondary | ICD-10-CM | POA: Insufficient documentation

## 2013-11-07 DIAGNOSIS — I2510 Atherosclerotic heart disease of native coronary artery without angina pectoris: Secondary | ICD-10-CM

## 2013-11-07 DIAGNOSIS — E785 Hyperlipidemia, unspecified: Secondary | ICD-10-CM | POA: Insufficient documentation

## 2013-11-07 DIAGNOSIS — Z87891 Personal history of nicotine dependence: Secondary | ICD-10-CM | POA: Insufficient documentation

## 2013-11-07 DIAGNOSIS — R0602 Shortness of breath: Secondary | ICD-10-CM | POA: Insufficient documentation

## 2013-11-07 MED ORDER — TECHNETIUM TC 99M SESTAMIBI GENERIC - CARDIOLITE
10.0000 | Freq: Once | INTRAVENOUS | Status: AC | PRN
  Administered 2013-11-07: 10 via INTRAVENOUS

## 2013-11-07 MED ORDER — REGADENOSON 0.4 MG/5ML IV SOLN
0.4000 mg | Freq: Once | INTRAVENOUS | Status: AC
  Administered 2013-11-07: 0.4 mg via INTRAVENOUS

## 2013-11-07 MED ORDER — TECHNETIUM TC 99M SESTAMIBI GENERIC - CARDIOLITE
30.0000 | Freq: Once | INTRAVENOUS | Status: AC | PRN
  Administered 2013-11-07: 30 via INTRAVENOUS

## 2013-11-07 NOTE — Progress Notes (Signed)
[facility] SITE 3 NUCLEAR MED [address] [place], [place] [address] [phone]    Cardiology Nuclear Med Study  Piedad ClimesDavid W [name] is a 63 y.o. male     MRN : [id number]     DOB: [birth year]  Procedure Date: 11/07/2013  Nuclear Med Background Indication for Stress Test:  Evaluation for Ischemia and Stent Patency History:  CAD, CATH-Mod-Cath-Stent-RCA, '07 ECHO EF NO Cardiac Risk Factors: Family History - CAD, History of Smoking, Hypertension and Lipids  Symptoms:  Chest Pain and SOB   Nuclear Pre-Procedure Caffeine/Decaff Intake:  None > 12 hrs NPO After: 7:00pm   Lungs:  clear O2 Sat: 95% on room air. IV 0.9% NS with Angio Cath:  22g  IV Site: R Wrist, tolerated well IV Started by:  Irean HongPatsy Edwards, RN  Chest Size (in):  54 Cup Size: n/a  Height: 5\' 11"  (1.803 m)  Weight:  267 lb (121.11 kg)  BMI:  Body mass index is 37.26 kg/(m^2). Tech Comments:  Took Coreg last night,and  no medications today.    Nuclear Med Study 1 or 2 day study: 1 day  Stress Test Type:  Lexiscan  Reading MD: N/A  Order Authorizing Provider:  Verne Carrowhristopher McAlhany, MD  Resting Radionuclide: Technetium 8336m Sestamibi  Resting Radionuclide Dose: 11.0 mCi   Stress Radionuclide:  Technetium 9536m Sestamibi  Stress Radionuclide Dose: 33.0 mCi           Stress Protocol Rest HR: 52 Stress HR: 85  Rest BP: 113/70 Stress BP: 148/80  Exercise Time (min): n/a METS: n/a   Predicted Max HR: 159 bpm % Max HR: 53.46 bpm Rate Pressure Product: [id number]   Dose of Adenosine (mg):  n/a Dose of Lexiscan: 0.4 mg  Dose of Atropine (mg): n/a Dose of Dobutamine: n/a mcg/kg/min (at max HR)  Stress Test Technologist: Milana NaSabrina Williams, EMT-P  Nuclear Technologist:  Doyne Keelonya Yount, CNMT     Rest Procedure:  Myocardial perfusion imaging was performed at rest 45 minutes following the intravenous administration of Technetium 1836m Sestamibi. Rest ECG: NSR - Normal EKG  Stress Procedure:  The patient received IV Lexiscan  0.4 mg over 15-seconds.  Technetium 2436m Sestamibi injected at 30-seconds. This patient had sob and chest tightness with the Lexiscan injection.  Quantitative spect images were obtained after a 45 minute delay. Stress ECG: No significant change from baseline ECG  QPS Raw Data Images:  Normal; no motion artifact; normal heart/lung ratio. Stress Images:  There is decreased uptake in the lateral wall. Rest Images:  There is decreased uptake in the lateral wall. Subtraction (SDS):  There is a fixed defect that is most consistent with a previous infarction. Transient Ischemic Dilatation (Normal <1.22):  1.10 Lung/Heart Ratio (Normal <0.45):  0.24  Quantitative Gated Spect Images QGS EDV:  166 ml QGS ESV:  91 ml  Impression Exercise Capacity:  Lexiscan with no exercise. BP Response:  Normal blood pressure response. Clinical Symptoms:  Mild chest pain/dyspnea. ECG Impression:  No significant ST segment change suggestive of ischemia. Comparison with Prior Nuclear Study: No images to compare  Overall Impression:  Low risk stress nuclear study.  There is a moderate size moderate severity fixed  defect involving apical lateral, mid anterolateral and mid inferolateral segments.  There is no significant reversible ischemia.  LV Ejection Fraction: 45%.  LV Wall Motion:  There is mild lateral hypokinesis.   [name]

## 2013-11-08 ENCOUNTER — Other Ambulatory Visit: Payer: Self-pay

## 2013-11-08 MED ORDER — ISOSORBIDE MONONITRATE ER 30 MG PO TB24: 30.0000 mg | ORAL_TABLET | Freq: Every day | ORAL | Status: DC

## 2013-11-08 MED ORDER — LISINOPRIL 40 MG PO TABS: 40.0000 mg | ORAL_TABLET | Freq: Every day | ORAL | Status: DC

## 2013-11-08 MED ORDER — CARVEDILOL 12.5 MG PO TABS: 12.5000 mg | ORAL_TABLET | Freq: Two times a day (BID) | ORAL | Status: DC

## 2013-11-20 ENCOUNTER — Encounter: Payer: Self-pay | Admitting: Cardiovascular Disease

## 2013-11-20 ENCOUNTER — Ambulatory Visit (INDEPENDENT_AMBULATORY_CARE_PROVIDER_SITE_OTHER): Payer: Medicare Other | Admitting: Cardiovascular Disease

## 2013-11-20 VITALS — BP 120/72 | HR 66 | Ht 70.0 in | Wt 271.0 lb

## 2013-11-20 DIAGNOSIS — I509 Heart failure, unspecified: Secondary | ICD-10-CM

## 2013-11-20 DIAGNOSIS — I2510 Atherosclerotic heart disease of native coronary artery without angina pectoris: Secondary | ICD-10-CM

## 2013-11-20 DIAGNOSIS — I5032 Chronic diastolic (congestive) heart failure: Secondary | ICD-10-CM

## 2013-11-20 NOTE — Patient Instructions (Signed)
Your physician wants you to follow-up in:  6 months. You will receive a reminder letter in the mail two months in advance. If you don't receive a letter, please call our office to schedule the follow-up appointment.   

## 2013-11-20 NOTE — Progress Notes (Signed)
History of Present Illness: [age] yo WM with history of CAD s/p two drug eluting stents to RCA 11/09, HTN, OA here today for follow up.  Last cath on 05/05/11. He was found to have stable CAD. I saw him in the office 11/01/13 and he had c/o worsened chest pain and dyspnea with exertion and at rest. I arranged a Lexiscan stress myoview on 11/14/13 that did not show ischemia but it did show a fixed defect in the apex, anterolateral wall and inferolateral wall.   He is here today for follow up. No recent chest pains. He has mild dyspnea with exertion. He had not been using Lasix every day but feeling much better since using it daily as recommended. No palpitations, near syncope, syncope, orthopnea or PND. His right foot and ankle are severely painful. He had a prior fall and has had fusion of the ankle.   Primary Care Physician[facility]    Last Lipid Profile: Followed in primary care.    Past Medical History  Diagnosis Date  . Arthritis   . CAD (coronary artery disease)     s/p prior PCI  . Hypertension   . Chronic diastolic CHF (congestive heart failure)     Past Surgical History  Procedure Laterality Date  . Carpal tunnel release      Bilateral  . Rotator cuff repair    . Total knee arthroplasty      Bilateral  . Foot surgery      Right  . Ankle surgery      Fusion  . Back surgery      x3  . Pacemaker insertion      Greenfield filter  . Thoracotomy      For access to a T10-12 fusion complicated by hemothorax and 44 day hospitalization  . Coronary angioplasty with stent placement      Stenting of the RCA as well as other stenting procedures in the past    Current Outpatient Prescriptions  Medication Sig Dispense Refill  . aspirin 81 MG tablet Take 81 mg by mouth daily.        . carvedilol (COREG) 12.5 MG tablet Take 1 tablet (12.5 mg total) by mouth 2 (two) times daily.  180 tablet  1  . doxycycline (DORYX) 100 MG DR capsule Take 100 mg  by mouth 2 (two) times daily.        . fentaNYL (DURAGESIC - DOSED MCG/HR) 75 MCG/HR Place 75 mcg onto the skin every 3 (three) days.      . furosemide (LASIX) 20 MG tablet Take 20 mg by mouth as needed.      . hydrochlorothiazide (HYDRODIURIL) 25 MG tablet Take 1 tablet (25 mg total) by mouth daily.  90 tablet  1  . isosorbide mononitrate (IMDUR) 30 MG 24 hr tablet Take 1 tablet (30 mg total) by mouth daily.  90 tablet  0  . lisinopril (PRINIVIL,ZESTRIL) 40 MG tablet Take 1 tablet (40 mg total) by mouth daily.  90 tablet  0  . nitroGLYCERIN (NITROSTAT) 0.4 MG SL tablet ONE TABLET UNDER TONGUE EVERY 5 MINUTES AS NEEDED FOR CHEST PAIN **MAY REPEAT UP TO 3 TIMES**  25 tablet  4  . oxyCODONE-acetaminophen (PERCOCET) 10-325 MG per tablet       . potassium chloride (KLOR-CON 10) 10 MEQ tablet Take 1 tablet (10 mEq total) by mouth 2 (two) times daily.  180 tablet  2  . pravastatin (PRAVACHOL)  80 MG tablet Take 1 tablet (80 mg total) by mouth daily.  90 tablet  2   No current facility-administered medications for this visit.    No Known Allergies  History   Social History  . Marital Status: Married    Spouse Name: N/A    Number of Children: N/A  . Years of Education: N/A   Occupational History  . disability    Social History Main Topics  . Smoking status: Former Games developer  . Smokeless tobacco: Not on file     Comment: quit 35 years ago  . Alcohol Use: No  . Drug Use: No  . Sexual Activity: Not on file   Other Topics Concern  . Not on file   Social History Narrative   Married with 3 children    Family History  Problem Relation Age of Onset  . Heart attack Mother   . Coronary artery disease Mother   . Lung disease Father     Review of Systems:  As stated in the HPI and otherwise negative.   BP 120/72  Pulse 66  Ht 5\' 10"  (1.778 m)  Wt 271 lb (122.925 kg)  BMI 38.88 kg/m2  Physical Examination: General: Well developed, well nourished, NAD HEENT: OP clear, mucus membranes  moist SKIN: warm, dry. No rashes. Neuro: No focal deficits Musculoskeletal: Muscle strength 5/5 all ext Psychiatric: Mood and affect normal Neck: No JVD, no carotid bruits, no thyromegaly, no lymphadenopathy. Lungs:Clear bilaterally, no wheezes, rhonci, crackles Cardiovascular: Regular rate and rhythm. No murmurs, gallops or rubs. Abdomen:Soft. Bowel sounds present. Non-tender.  Extremities: No lower extremity edema. Pulses are 2 + in the bilateral DP/PT.  1. Cardiac Cath: 05/05/11:  1. The left main coronary artery had no evidence of disease.  2. The left anterior descending was a large vessel that coursed to the  apex and gave off 2 diagonal branches. The first diagonal branch  was small in caliber and had an ostial 90% stenosis. This was  unchanged from prior catheterization. The second diagonal branch  had mild plaque disease. The proximal and mid LAD had mild plaque  disease, but no flow-limiting lesions. The distal vessel became  tortuous and relatively small in caliber and had what appeared to  be several 50% lesions with some myocardial bridging noted. This was grossly unchanged from last catheterization.  3. The circumflex artery had serial 40% lesions throughout the  proximal and midportion of the vessel. The first obtuse marginal  branch was chronically occluded at the ostium and fills from left-  to-left collaterals. Second obtuse marginal branch had diffuse 40%  plaque.  4. The right coronary artery is a large dominant vessel with 50%  plaque throughout the proximal portion of the vessel. There was a  stent present in the mid vessel with 40% in-stent restenosis.  There was a distal stent that is patent with 30% in-stent  restenosis.  5. Left ventricular angiogram was performed in the RAO projection and  showed normal left ventricular systolic function with ejection  fraction of 55%.  Lexiscan stress myoview 11/07/13:  Stress Procedure: The patient received IV Lexiscan  0.4 mg over 15-seconds. Technetium 48m Sestamibi injected at 30-seconds. This patient had sob and chest tightness with the Lexiscan injection. Quantitative spect images were obtained after a 45 minute delay.  Stress ECG: No significant change from baseline ECG  QPS  Raw Data Images: Normal; no motion artifact; normal heart/lung ratio.  Stress Images: There is decreased uptake in the lateral  wall.  Rest Images: There is decreased uptake in the lateral wall.  Subtraction (SDS): There is a fixed defect that is most consistent with a previous infarction.  Transient Ischemic Dilatation (Normal <1.22): 1.10  Lung/Heart Ratio (Normal <0.45): 0.24  Quantitative Gated Spect Images  QGS EDV: 166 ml  QGS ESV: 91 ml  Impression  Exercise Capacity: Lexiscan with no exercise.  BP Response: Normal blood pressure response.  Clinical Symptoms: Mild chest pain/dyspnea.  ECG Impression: No significant ST segment change suggestive of ischemia.  Comparison with Prior Nuclear Study: No images to compare  Overall Impression: Low risk stress nuclear study. There is a moderate size moderate severity fixed defect involving apical lateral, mid anterolateral and mid inferolateral segments. There is no significant reversible ischemia.  LV Ejection Fraction: 45%. LV Wall Motion: There is mild lateral hypokinesis.  Cardiac cath September 2012: 1. The left main coronary artery had no evidence of disease.  2. The left anterior descending was a large vessel that coursed to the  apex and gave off 2 diagonal branches. The first diagonal branch  was small in caliber and had an ostial 90% stenosis. This was  unchanged from prior catheterization. The second diagonal branch  had mild plaque disease. The proximal and mid LAD had mild plaque  disease, but no flow-limiting lesions. The distal vessel became  tortuous and relatively small in caliber and had what appeared to  be several 50% lesions with some myocardial bridging  noted. This was grossly unchanged from last catheterization.  3. The circumflex artery had serial 40% lesions throughout the  proximal and midportion of the vessel. The first obtuse marginal  branch was chronically occluded at the ostium and fills from left-  to-left collaterals. Second obtuse marginal branch had diffuse 40%  plaque.  4. The right coronary artery is a large dominant vessel with 50%  plaque throughout the proximal portion of the vessel. There was a  stent present in the mid vessel with 40% in-stent restenosis.  There was a distal stent that is patent with 30% in-stent  restenosis.  5. Left ventricular angiogram was performed in the RAO projection and  showed normal left ventricular systolic function with ejection  fraction of 55%.  Assessment and Plan:   1. Chest pain: Recent chest pain has resolved. Stress myoview did not show ischemia. Fixed defect c/w known CTO of OM branch. Chest pain is most likely non-cardiac.    2. CAD: As above. Last cath September 2012 with moderate disease in the RCA, Circumflex and LAD. Continue current medical management. Lipids followed in primary care. BP is well controlled.   3. Diastolic CHF, chronic: Volume status is ok.  Continue Lasix daily.

## 2014-01-19 ENCOUNTER — Ambulatory Visit: Payer: Medicare Other | Admitting: Cardiovascular Disease

## 2014-02-07 ENCOUNTER — Other Ambulatory Visit: Payer: Self-pay

## 2014-02-07 MED ORDER — LISINOPRIL 40 MG PO TABS: 40.0000 mg | ORAL_TABLET | Freq: Every day | ORAL | Status: DC

## 2014-02-07 MED ORDER — ISOSORBIDE MONONITRATE ER 30 MG PO TB24: 30.0000 mg | ORAL_TABLET | Freq: Every day | ORAL | Status: DC

## 2014-02-07 MED ORDER — HYDROCHLOROTHIAZIDE 25 MG PO TABS: 25.0000 mg | ORAL_TABLET | Freq: Every day | ORAL | Status: DC

## 2014-02-07 MED ORDER — POTASSIUM CHLORIDE ER 10 MEQ PO TBCR: 10.0000 meq | EXTENDED_RELEASE_TABLET | Freq: Two times a day (BID) | ORAL | Status: DC

## 2014-02-07 MED ORDER — PRAVASTATIN SODIUM 80 MG PO TABS: 80.0000 mg | ORAL_TABLET | Freq: Every day | ORAL | Status: DC

## 2014-02-12 ENCOUNTER — Other Ambulatory Visit: Payer: Self-pay

## 2014-02-12 MED ORDER — ISOSORBIDE MONONITRATE ER 30 MG PO TB24: 30.0000 mg | ORAL_TABLET | Freq: Every day | ORAL | Status: AC

## 2014-02-12 MED ORDER — LISINOPRIL 40 MG PO TABS: 40.0000 mg | ORAL_TABLET | Freq: Every day | ORAL | Status: AC

## 2014-02-12 MED ORDER — CARVEDILOL 12.5 MG PO TABS: 12.5000 mg | ORAL_TABLET | Freq: Two times a day (BID) | ORAL | Status: DC

## 2014-02-12 MED ORDER — HYDROCHLOROTHIAZIDE 25 MG PO TABS: 25.0000 mg | ORAL_TABLET | Freq: Every day | ORAL | Status: AC

## 2014-02-12 MED ORDER — POTASSIUM CHLORIDE ER 10 MEQ PO TBCR: 10.0000 meq | EXTENDED_RELEASE_TABLET | Freq: Two times a day (BID) | ORAL | Status: AC

## 2014-02-22 ENCOUNTER — Other Ambulatory Visit: Payer: Self-pay

## 2014-02-22 MED ORDER — PRAVASTATIN SODIUM 80 MG PO TABS
80.0000 mg | ORAL_TABLET | Freq: Every day | ORAL | Status: AC
Start: 2014-02-22 — End: ?

## 2014-03-14 ENCOUNTER — Encounter (HOSPITAL_COMMUNITY): Payer: Self-pay | Admitting: Emergency Medicine

## 2014-03-14 DIAGNOSIS — I509 Heart failure, unspecified: Secondary | ICD-10-CM | POA: Diagnosis present

## 2014-03-14 DIAGNOSIS — I498 Other specified cardiac arrhythmias: Secondary | ICD-10-CM | POA: Diagnosis not present

## 2014-03-14 DIAGNOSIS — Z9861 Coronary angioplasty status: Secondary | ICD-10-CM

## 2014-03-14 DIAGNOSIS — Z8249 Family history of ischemic heart disease and other diseases of the circulatory system: Secondary | ICD-10-CM

## 2014-03-14 DIAGNOSIS — D72829 Elevated white blood cell count, unspecified: Secondary | ICD-10-CM | POA: Diagnosis present

## 2014-03-14 DIAGNOSIS — E860 Dehydration: Secondary | ICD-10-CM | POA: Diagnosis present

## 2014-03-14 DIAGNOSIS — G8929 Other chronic pain: Secondary | ICD-10-CM | POA: Diagnosis present

## 2014-03-14 DIAGNOSIS — Z6836 Body mass index (BMI) 36.0-36.9, adult: Secondary | ICD-10-CM

## 2014-03-14 DIAGNOSIS — Z87891 Personal history of nicotine dependence: Secondary | ICD-10-CM

## 2014-03-14 DIAGNOSIS — E872 Acidosis, unspecified: Secondary | ICD-10-CM | POA: Diagnosis present

## 2014-03-14 DIAGNOSIS — I10 Essential (primary) hypertension: Secondary | ICD-10-CM | POA: Diagnosis present

## 2014-03-14 DIAGNOSIS — D696 Thrombocytopenia, unspecified: Secondary | ICD-10-CM | POA: Diagnosis present

## 2014-03-14 DIAGNOSIS — Z86711 Personal history of pulmonary embolism: Secondary | ICD-10-CM

## 2014-03-14 DIAGNOSIS — Z7982 Long term (current) use of aspirin: Secondary | ICD-10-CM

## 2014-03-14 DIAGNOSIS — Z96659 Presence of unspecified artificial knee joint: Secondary | ICD-10-CM

## 2014-03-14 DIAGNOSIS — M129 Arthropathy, unspecified: Secondary | ICD-10-CM | POA: Diagnosis present

## 2014-03-14 DIAGNOSIS — M549 Dorsalgia, unspecified: Secondary | ICD-10-CM | POA: Diagnosis not present

## 2014-03-14 DIAGNOSIS — I824Y9 Acute embolism and thrombosis of unspecified deep veins of unspecified proximal lower extremity: Principal | ICD-10-CM | POA: Diagnosis present

## 2014-03-14 DIAGNOSIS — Z981 Arthrodesis status: Secondary | ICD-10-CM

## 2014-03-14 DIAGNOSIS — I2510 Atherosclerotic heart disease of native coronary artery without angina pectoris: Secondary | ICD-10-CM | POA: Diagnosis present

## 2014-03-14 DIAGNOSIS — E669 Obesity, unspecified: Secondary | ICD-10-CM | POA: Diagnosis present

## 2014-03-14 DIAGNOSIS — N179 Acute kidney failure, unspecified: Secondary | ICD-10-CM | POA: Diagnosis present

## 2014-03-14 DIAGNOSIS — I5032 Chronic diastolic (congestive) heart failure: Secondary | ICD-10-CM | POA: Diagnosis present

## 2014-03-14 DIAGNOSIS — E785 Hyperlipidemia, unspecified: Secondary | ICD-10-CM | POA: Diagnosis present

## 2014-03-14 LAB — URINALYSIS, ROUTINE W REFLEX MICROSCOPIC
Bilirubin Urine: NEGATIVE
Glucose, UA: NEGATIVE mg/dL
Ketones, ur: NEGATIVE mg/dL
Leukocytes, UA: NEGATIVE
Nitrite: NEGATIVE
Protein, ur: NEGATIVE mg/dL
SPECIFIC GRAVITY, URINE: 1.012 (ref 1.005–1.030)
UROBILINOGEN UA: 0.2 mg/dL (ref 0.0–1.0)
pH: 5 (ref 5.0–8.0)

## 2014-03-14 LAB — URINE MICROSCOPIC-ADD ON

## 2014-03-14 NOTE — ED Notes (Addendum)
Pt. reports dysuria / oliguria with low back pain radiating to both groin onset today , denies injury or fall , no fever or chills.

## 2014-03-15 ENCOUNTER — Encounter (HOSPITAL_COMMUNITY): Payer: Self-pay

## 2014-03-15 ENCOUNTER — Emergency Department (HOSPITAL_COMMUNITY): Payer: Medicare Other

## 2014-03-15 ENCOUNTER — Inpatient Hospital Stay (HOSPITAL_COMMUNITY)
Admission: EM | Admit: 2014-03-15 | Discharge: 2014-03-20 | DRG: 238 | Disposition: A | Discharge status: Home / Self Care | Payer: Medicare Other | Attending: Internal Medicine | Admitting: Internal Medicine

## 2014-03-15 ENCOUNTER — Inpatient Hospital Stay (HOSPITAL_COMMUNITY): Payer: Medicare Other

## 2014-03-15 DIAGNOSIS — IMO0002 Reserved for concepts with insufficient information to code with codable children: Secondary | ICD-10-CM

## 2014-03-15 DIAGNOSIS — E872 Acidosis, unspecified: Secondary | ICD-10-CM | POA: Diagnosis present

## 2014-03-15 DIAGNOSIS — I5032 Chronic diastolic (congestive) heart failure: Secondary | ICD-10-CM | POA: Diagnosis present

## 2014-03-15 DIAGNOSIS — I498 Other specified cardiac arrhythmias: Secondary | ICD-10-CM | POA: Diagnosis not present

## 2014-03-15 DIAGNOSIS — M545 Low back pain: Secondary | ICD-10-CM | POA: Diagnosis present

## 2014-03-15 DIAGNOSIS — M5416 Radiculopathy, lumbar region: Secondary | ICD-10-CM

## 2014-03-15 DIAGNOSIS — M129 Arthropathy, unspecified: Secondary | ICD-10-CM | POA: Diagnosis present

## 2014-03-15 DIAGNOSIS — R001 Bradycardia, unspecified: Secondary | ICD-10-CM | POA: Diagnosis not present

## 2014-03-15 DIAGNOSIS — G8929 Other chronic pain: Secondary | ICD-10-CM | POA: Diagnosis present

## 2014-03-15 DIAGNOSIS — N179 Acute kidney failure, unspecified: Secondary | ICD-10-CM | POA: Diagnosis present

## 2014-03-15 DIAGNOSIS — I2510 Atherosclerotic heart disease of native coronary artery without angina pectoris: Secondary | ICD-10-CM | POA: Diagnosis present

## 2014-03-15 DIAGNOSIS — R112 Nausea with vomiting, unspecified: Secondary | ICD-10-CM | POA: Diagnosis present

## 2014-03-15 DIAGNOSIS — D696 Thrombocytopenia, unspecified: Secondary | ICD-10-CM | POA: Diagnosis present

## 2014-03-15 DIAGNOSIS — E785 Hyperlipidemia, unspecified: Secondary | ICD-10-CM | POA: Diagnosis present

## 2014-03-15 DIAGNOSIS — R1110 Vomiting, unspecified: Secondary | ICD-10-CM

## 2014-03-15 DIAGNOSIS — Z6836 Body mass index (BMI) 36.0-36.9, adult: Secondary | ICD-10-CM | POA: Diagnosis not present

## 2014-03-15 DIAGNOSIS — Z86711 Personal history of pulmonary embolism: Secondary | ICD-10-CM | POA: Diagnosis not present

## 2014-03-15 DIAGNOSIS — R52 Pain, unspecified: Secondary | ICD-10-CM

## 2014-03-15 DIAGNOSIS — Z7982 Long term (current) use of aspirin: Secondary | ICD-10-CM | POA: Diagnosis not present

## 2014-03-15 DIAGNOSIS — E860 Dehydration: Secondary | ICD-10-CM | POA: Diagnosis present

## 2014-03-15 DIAGNOSIS — I10 Essential (primary) hypertension: Secondary | ICD-10-CM | POA: Diagnosis present

## 2014-03-15 DIAGNOSIS — R1031 Right lower quadrant pain: Secondary | ICD-10-CM | POA: Diagnosis present

## 2014-03-15 DIAGNOSIS — Z9861 Coronary angioplasty status: Secondary | ICD-10-CM | POA: Diagnosis not present

## 2014-03-15 DIAGNOSIS — I82493 Acute embolism and thrombosis of other specified deep vein of lower extremity, bilateral: Secondary | ICD-10-CM

## 2014-03-15 DIAGNOSIS — M549 Dorsalgia, unspecified: Secondary | ICD-10-CM | POA: Diagnosis present

## 2014-03-15 DIAGNOSIS — Z8249 Family history of ischemic heart disease and other diseases of the circulatory system: Secondary | ICD-10-CM | POA: Diagnosis not present

## 2014-03-15 DIAGNOSIS — Z87891 Personal history of nicotine dependence: Secondary | ICD-10-CM | POA: Diagnosis not present

## 2014-03-15 DIAGNOSIS — M5459 Other low back pain: Secondary | ICD-10-CM | POA: Diagnosis present

## 2014-03-15 DIAGNOSIS — I824Y9 Acute embolism and thrombosis of unspecified deep veins of unspecified proximal lower extremity: Secondary | ICD-10-CM | POA: Diagnosis present

## 2014-03-15 DIAGNOSIS — I509 Heart failure, unspecified: Secondary | ICD-10-CM | POA: Diagnosis present

## 2014-03-15 DIAGNOSIS — R1013 Epigastric pain: Secondary | ICD-10-CM

## 2014-03-15 DIAGNOSIS — I82403 Acute embolism and thrombosis of unspecified deep veins of lower extremity, bilateral: Secondary | ICD-10-CM

## 2014-03-15 DIAGNOSIS — Z981 Arthrodesis status: Secondary | ICD-10-CM | POA: Diagnosis not present

## 2014-03-15 DIAGNOSIS — Z96659 Presence of unspecified artificial knee joint: Secondary | ICD-10-CM | POA: Diagnosis not present

## 2014-03-15 DIAGNOSIS — E669 Obesity, unspecified: Secondary | ICD-10-CM | POA: Diagnosis present

## 2014-03-15 DIAGNOSIS — R3129 Other microscopic hematuria: Secondary | ICD-10-CM | POA: Diagnosis present

## 2014-03-15 DIAGNOSIS — D72829 Elevated white blood cell count, unspecified: Secondary | ICD-10-CM | POA: Diagnosis present

## 2014-03-15 HISTORY — DX: Other complications of anesthesia, initial encounter: T88.59XA

## 2014-03-15 HISTORY — DX: Adverse effect of unspecified anesthetic, initial encounter: T41.45XA

## 2014-03-15 HISTORY — DX: Shortness of breath: R06.02

## 2014-03-15 HISTORY — DX: Other specified postprocedural states: R11.2

## 2014-03-15 HISTORY — DX: Other pulmonary embolism without acute cor pulmonale: I26.99

## 2014-03-15 HISTORY — DX: Other specified postprocedural states: Z98.890

## 2014-03-15 LAB — CBC WITH DIFFERENTIAL/PLATELET
BASOS ABS: 0 10*3/uL (ref 0.0–0.1)
Basophils Relative: 0 % (ref 0–1)
EOS ABS: 0.1 10*3/uL (ref 0.0–0.7)
Eosinophils Relative: 1 % (ref 0–5)
HCT: 38 % — ABNORMAL LOW (ref 39.0–52.0)
Hemoglobin: 12.2 g/dL — ABNORMAL LOW (ref 13.0–17.0)
Lymphocytes Relative: 8 % — ABNORMAL LOW (ref 12–46)
Lymphs Abs: 1 10*3/uL (ref 0.7–4.0)
MCH: 29.1 pg (ref 26.0–34.0)
MCHC: 32.1 g/dL (ref 30.0–36.0)
MCV: 90.7 fL (ref 78.0–100.0)
Monocytes Absolute: 1.1 10*3/uL — ABNORMAL HIGH (ref 0.1–1.0)
Monocytes Relative: 9 % (ref 3–12)
NEUTROS ABS: 10.4 10*3/uL — AB (ref 1.7–7.7)
NEUTROS PCT: 82 % — AB (ref 43–77)
Platelets: 129 10*3/uL — ABNORMAL LOW (ref 150–400)
RBC: 4.19 MIL/uL — ABNORMAL LOW (ref 4.22–5.81)
RDW: 14.8 % (ref 11.5–15.5)
WBC: 12.6 10*3/uL — ABNORMAL HIGH (ref 4.0–10.5)

## 2014-03-15 LAB — COMPREHENSIVE METABOLIC PANEL
ALBUMIN: 4.4 g/dL (ref 3.5–5.2)
ALT: 19 U/L (ref 0–53)
ANION GAP: 20 — AB (ref 5–15)
AST: 24 U/L (ref 0–37)
Alkaline Phosphatase: 71 U/L (ref 39–117)
BILIRUBIN TOTAL: 1.1 mg/dL (ref 0.3–1.2)
BUN: 21 mg/dL (ref 6–23)
CALCIUM: 9.5 mg/dL (ref 8.4–10.5)
CHLORIDE: 92 meq/L — AB (ref 96–112)
CO2: 21 mEq/L (ref 19–32)
Creatinine, Ser: 0.81 mg/dL (ref 0.50–1.35)
GFR calc Af Amer: >90 mL/min (ref >90)
GFR calc non Af Amer: >90 mL/min (ref >90)
Glucose, Bld: 146 mg/dL — ABNORMAL HIGH (ref 70–99)
Potassium: 4.1 mEq/L (ref 3.7–5.3)
Sodium: 133 mEq/L — ABNORMAL LOW (ref 137–147)
TOTAL PROTEIN: 8.3 g/dL (ref 6.0–8.3)

## 2014-03-15 MED ORDER — ONDANSETRON HCL 4 MG/2ML IJ SOLN: 4.0000 mg | Freq: Three times a day (TID) | INTRAMUSCULAR | Status: DC | PRN

## 2014-03-15 MED ORDER — ISOSORBIDE MONONITRATE ER 30 MG PO TB24
30.0000 mg | ORAL_TABLET | Freq: Every day | ORAL | Status: DC
  Administered 2014-03-15 – 2014-03-20 (×6): 30 mg via ORAL
  Filled 2014-03-15 (×8): qty 1

## 2014-03-15 MED ORDER — OXYCODONE-ACETAMINOPHEN 5-325 MG PO TABS
1.0000 | ORAL_TABLET | ORAL | Status: DC | PRN
  Administered 2014-03-15 – 2014-03-18 (×11): 1 via ORAL
  Filled 2014-03-15 (×11): qty 1

## 2014-03-15 MED ORDER — HYDROCHLOROTHIAZIDE 25 MG PO TABS
25.0000 mg | ORAL_TABLET | Freq: Every day | ORAL | Status: DC
  Administered 2014-03-15 – 2014-03-20 (×6): 25 mg via ORAL
  Filled 2014-03-15 (×8): qty 1

## 2014-03-15 MED ORDER — HYDROMORPHONE HCL PF 1 MG/ML IJ SOLN
1.0000 mg | Freq: Once | INTRAMUSCULAR | Status: AC
  Administered 2014-03-15: 1 mg via INTRAVENOUS

## 2014-03-15 MED ORDER — ALBUTEROL SULFATE (2.5 MG/3ML) 0.083% IN NEBU: 2.5000 mg | INHALATION_SOLUTION | RESPIRATORY_TRACT | Status: DC | PRN

## 2014-03-15 MED ORDER — HYDROMORPHONE HCL PF 1 MG/ML IJ SOLN
1.0000 mg | Freq: Once | INTRAMUSCULAR | Status: AC
  Administered 2014-03-15: 1 mg via INTRAVENOUS
  Filled 2014-03-15: qty 1

## 2014-03-15 MED ORDER — ASPIRIN EC 81 MG PO TBEC
81.0000 mg | DELAYED_RELEASE_TABLET | Freq: Every day | ORAL | Status: DC
  Administered 2014-03-15 – 2014-03-16 (×2): 81 mg via ORAL
  Filled 2014-03-15 (×2): qty 1

## 2014-03-15 MED ORDER — ALUM & MAG HYDROXIDE-SIMETH 200-200-20 MG/5ML PO SUSP: 30.0000 mL | Freq: Four times a day (QID) | ORAL | Status: DC | PRN

## 2014-03-15 MED ORDER — HYDROMORPHONE HCL PF 1 MG/ML IJ SOLN: 1.0000 mg | INTRAMUSCULAR | Status: DC | PRN

## 2014-03-15 MED ORDER — POTASSIUM CHLORIDE ER 10 MEQ PO TBCR
10.0000 meq | EXTENDED_RELEASE_TABLET | Freq: Two times a day (BID) | ORAL | Status: DC
  Administered 2014-03-15 – 2014-03-20 (×11): 10 meq via ORAL
  Filled 2014-03-15 (×18): qty 1

## 2014-03-15 MED ORDER — MORPHINE SULFATE 4 MG/ML IJ SOLN
4.0000 mg | Freq: Once | INTRAMUSCULAR | Status: DC
  Filled 2014-03-15: qty 1

## 2014-03-15 MED ORDER — ONDANSETRON HCL 4 MG/2ML IJ SOLN
4.0000 mg | Freq: Four times a day (QID) | INTRAMUSCULAR | Status: DC | PRN
  Administered 2014-03-16: 4 mg via INTRAVENOUS
  Filled 2014-03-15 (×3): qty 2

## 2014-03-15 MED ORDER — HYDROMORPHONE HCL PF 1 MG/ML IJ SOLN
1.0000 mg | INTRAMUSCULAR | Status: DC | PRN
  Administered 2014-03-15 – 2014-03-16 (×5): 1 mg via INTRAVENOUS
  Filled 2014-03-15 (×5): qty 1

## 2014-03-15 MED ORDER — METHOCARBAMOL 1000 MG/10ML IJ SOLN
500.0000 mg | Freq: Four times a day (QID) | INTRAMUSCULAR | Status: DC | PRN
  Administered 2014-03-17: 500 mg via INTRAVENOUS
  Filled 2014-03-15 (×2): qty 5

## 2014-03-15 MED ORDER — HYDROMORPHONE HCL PF 1 MG/ML IJ SOLN
2.0000 mg | Freq: Once | INTRAMUSCULAR | Status: AC
  Administered 2014-03-15: 2 mg via INTRAVENOUS
  Filled 2014-03-15: qty 2

## 2014-03-15 MED ORDER — SENNOSIDES-DOCUSATE SODIUM 8.6-50 MG PO TABS
2.0000 | ORAL_TABLET | Freq: Every day | ORAL | Status: DC
  Administered 2014-03-15 – 2014-03-19 (×4): 2 via ORAL
  Filled 2014-03-15 (×10): qty 2

## 2014-03-15 MED ORDER — ONDANSETRON HCL 4 MG PO TABS: 4.0000 mg | ORAL_TABLET | Freq: Four times a day (QID) | ORAL | Status: DC | PRN

## 2014-03-15 MED ORDER — HEPARIN SODIUM (PORCINE) 5000 UNIT/ML IJ SOLN
5000.0000 [IU] | Freq: Three times a day (TID) | INTRAMUSCULAR | Status: DC
  Administered 2014-03-15 – 2014-03-16 (×5): 5000 [IU] via SUBCUTANEOUS
  Filled 2014-03-15 (×6): qty 1

## 2014-03-15 MED ORDER — LISINOPRIL 40 MG PO TABS
40.0000 mg | ORAL_TABLET | Freq: Every day | ORAL | Status: DC
  Administered 2014-03-15 – 2014-03-20 (×6): 40 mg via ORAL
  Filled 2014-03-15 (×5): qty 1
  Filled 2014-03-15: qty 2
  Filled 2014-03-15: qty 1

## 2014-03-15 MED ORDER — GUAIFENESIN-DM 100-10 MG/5ML PO SYRP: 5.0000 mL | ORAL_SOLUTION | ORAL | Status: DC | PRN

## 2014-03-15 MED ORDER — HYDROMORPHONE HCL PF 1 MG/ML IJ SOLN
INTRAMUSCULAR | Status: AC
  Filled 2014-03-15: qty 1

## 2014-03-15 MED ORDER — ONDANSETRON HCL 4 MG/2ML IJ SOLN
4.0000 mg | Freq: Once | INTRAMUSCULAR | Status: AC
  Administered 2014-03-15: 4 mg via INTRAVENOUS
  Filled 2014-03-15: qty 2

## 2014-03-15 MED ORDER — SODIUM CHLORIDE 0.9 % IV SOLN: 250.0000 mL | INTRAVENOUS | Status: DC | PRN

## 2014-03-15 MED ORDER — CARVEDILOL 12.5 MG PO TABS
12.5000 mg | ORAL_TABLET | Freq: Two times a day (BID) | ORAL | Status: DC
  Administered 2014-03-15 – 2014-03-17 (×5): 12.5 mg via ORAL
  Filled 2014-03-15 (×8): qty 1

## 2014-03-15 MED ORDER — SODIUM CHLORIDE 0.9 % IV SOLN
INTRAVENOUS | Status: AC
  Administered 2014-03-15: 06:00:00 via INTRAVENOUS

## 2014-03-15 MED ORDER — POLYETHYLENE GLYCOL 3350 17 G PO PACK
17.0000 g | PACK | Freq: Every day | ORAL | Status: DC
  Administered 2014-03-19: 17 g via ORAL
  Filled 2014-03-15 (×6): qty 1

## 2014-03-15 MED ORDER — DEXAMETHASONE SODIUM PHOSPHATE 4 MG/ML IJ SOLN
4.0000 mg | Freq: Three times a day (TID) | INTRAMUSCULAR | Status: DC
  Administered 2014-03-15 – 2014-03-18 (×9): 4 mg via INTRAVENOUS
  Filled 2014-03-15 (×17): qty 1

## 2014-03-15 MED ORDER — NITROGLYCERIN 0.4 MG SL SUBL: 0.4000 mg | SUBLINGUAL_TABLET | SUBLINGUAL | Status: DC | PRN

## 2014-03-15 MED ORDER — SODIUM CHLORIDE 0.9 % IJ SOLN
3.0000 mL | Freq: Two times a day (BID) | INTRAMUSCULAR | Status: DC
  Administered 2014-03-15 – 2014-03-20 (×5): 3 mL via INTRAVENOUS

## 2014-03-15 MED ORDER — SIMVASTATIN 40 MG PO TABS
40.0000 mg | ORAL_TABLET | Freq: Every day | ORAL | Status: DC
  Administered 2014-03-15 – 2014-03-19 (×5): 40 mg via ORAL
  Filled 2014-03-15 (×8): qty 1

## 2014-03-15 MED ORDER — FENTANYL 25 MCG/HR TD PT72
75.0000 ug | MEDICATED_PATCH | TRANSDERMAL | Status: DC
  Administered 2014-03-15 – 2014-03-18 (×2): 75 ug via TRANSDERMAL
  Filled 2014-03-15 (×2): qty 1
  Filled 2014-03-15: qty 3

## 2014-03-15 MED ORDER — SODIUM CHLORIDE 0.9 % IJ SOLN: 3.0000 mL | INTRAMUSCULAR | Status: DC | PRN

## 2014-03-15 MED ORDER — PROMETHAZINE HCL 25 MG/ML IJ SOLN
12.5000 mg | Freq: Once | INTRAMUSCULAR | Status: AC
  Administered 2014-03-15: 12.5 mg via INTRAVENOUS
  Filled 2014-03-15: qty 1

## 2014-03-15 NOTE — Progress Notes (Signed)
Called [phone] for report. No answer.

## 2014-03-15 NOTE — H&P (Addendum)
PATIENT DETAILS Name: [name] Age: 63 y.o. Sex: male Date of Birth: 07-02-1951 Admit Date: 03/15/2014 ZOX:WRUEAVW,UJWJ, MD   CHIEF COMPLAINT:  Severe low back pain-2 days  HPI: [name] is a 63 y.o. male with a Past Medical History of chronic low back pain with degenerative disc disease, status post laminectomy and spinal cord stimulator placement, history of coronary disease status post prior PCI, hypertension, chronic diastolic heart failure who presents today with the above noted complaint. Per patient, he was in his usual state of health till 2 days ago when he started having worsening low back pain. Patient describes the pain as 10/10, and also claims that since yesterday he has been unable to ambulate. When asked if he is unable to ambulate because of pain or weakness, he says because of both. Pain radiates down to his groin and bilateral thighs. He claims that he has weakness in his lower extremities ever since the pain started. He does not have any urinary incontinence, however has hesitancy/urgency there have been going on for the past 6 months. He has known history of fecal incontinence. He denies any fever. He was seen in the emergency room, where he was given narcotics, he also had a few episodes of nausea and vomiting. Because of ongoing back pain, the hospitalist service was asked to admit this patient for further evaluation and treatment  ALLERGIES:  No Known Allergies  PAST MEDICAL HISTORY: Past Medical History  Diagnosis Date  . Arthritis   . CAD (coronary artery disease)     s/p prior PCI  . Hypertension   . Chronic diastolic CHF (congestive heart failure)   . Pulmonary emboli   . Complication of anesthesia   . PONV (postoperative nausea and vomiting)   . Shortness of breath     PAST SURGICAL HISTORY: Past Surgical History  Procedure Laterality Date  . Carpal tunnel release      Bilateral  . Rotator cuff repair    . Total knee arthroplasty      Bilateral  . Foot surgery      Right  . Ankle surgery      Fusion  . Back surgery      x3  . Pacemaker insertion      Greenfield filter  . Thoracotomy      For access to a T10-12 fusion complicated by hemothorax and 44 day hospitalization  . Coronary angioplasty with stent placement      Stenting of the RCA as well as other stenting procedures in the past    MEDICATIONS AT HOME: Prior to Admission medications   Medication Sig Start Date End Date Taking? Authorizing Provider  aspirin 81 MG tablet Take 81 mg by mouth daily.     Yes Historical Provider, MD  carvedilol (COREG) 12.5 MG tablet Take 1 tablet (12.5 mg total) by mouth 2 (two) times daily. 02/12/14  Yes Kathleene Hazel, MD  doxycycline (DORYX) 100 MG DR capsule Take 100 mg by mouth 2 (two) times daily.     Yes Historical Provider, MD  fentaNYL (DURAGESIC - DOSED MCG/HR) 75 MCG/HR Place 75 mcg onto the skin every 3 (three) days.   Yes Historical Provider, MD  furosemide (LASIX) 20 MG tablet Take 20 mg by mouth daily as needed for fluid.    Yes Historical Provider, MD  hydrochlorothiazide (HYDRODIURIL) 25 MG tablet Take 1 tablet (25 mg total) by mouth daily. 02/12/14  Yes Kathleene Hazel, MD  isosorbide mononitrate (IMDUR) 30 MG 24 hr tablet Take 1 tablet (30 mg total) by mouth daily. 02/12/14  Yes Kathleene Hazel, MD  lisinopril (PRINIVIL,ZESTRIL) 40 MG tablet Take 1 tablet (40 mg total) by mouth daily. 02/12/14  Yes Kathleene Hazel, MD  nitroGLYCERIN (NITROSTAT) 0.4 MG SL tablet Place 0.4 mg under the tongue every 5 (five) minutes as needed for chest pain.   Yes Historical Provider, MD  oxyCODONE-acetaminophen (PERCOCET) 10-325 MG per tablet Take 1 tablet by mouth every 4 (four) hours as needed for pain.  11/13/13  Yes Historical Provider, MD  potassium chloride (KLOR-CON 10) 10 MEQ tablet Take 1 tablet (10 mEq total) by mouth 2 (two) times daily. 02/12/14 02/12/15 Yes Kathleene Hazel, MD  pravastatin  (PRAVACHOL) 80 MG tablet Take 1 tablet (80 mg total) by mouth daily. 02/22/14  Yes Kathleene Hazel, MD    FAMILY HISTORY: Family History  Problem Relation Age of Onset  . Heart attack Mother   . Coronary artery disease Mother   . Lung disease Father     SOCIAL HISTORY:  reports that he quit smoking about 43 years ago. He has never used smokeless tobacco. He reports that he does not drink alcohol or use illicit drugs.  REVIEW OF SYSTEMS:  Constitutional:   No  weight loss, night sweats,  Fevers, chills, fatigue.  HEENT:    No headaches, Difficulty swallowing,Tooth/dental problems,Sore throat,  No sneezing, itching, ear ache, nasal congestion, post nasal drip,   Cardio-vascular: No chest pain,  Orthopnea, PND, swelling in lower extremities, anasarca, dizziness, palpitations  GI:  No heartburn, indigestion, abdominal pain, nausea, vomiting, diarrhea, change in bowel habits, loss of appetite  Resp: No shortness of breath with exertion or at rest.  No excess mucus, no productive cough, No non-productive cough,  No coughing up of blood.No change in color of mucus.No wheezing.No chest wall deformity  Skin:  no rash or lesions.  GU:  No change in color of urine,   No flank pain.  Musculoskeletal: No joint pain or swelling.  No decreased range of motion.  No back pain.  Psych: No change in mood or affect. No depression or anxiety.  No memory loss.   PHYSICAL EXAM: Blood pressure 108/57, pulse 70, temperature 99.1 F (37.3 C), temperature source Oral, resp. rate 18, height 5\' 11"  (1.803 m), weight 117.935 kg (260 lb), SpO2 94.00%.  General appearance :Awake, alert, in mild distress due to pain. Speech Clear. Not toxic Looking HEENT: Atraumatic and Normocephalic, pupils equally reactive to light and accomodation Neck: supple, no JVD. No cervical lymphadenopathy.  Chest:Good air entry bilaterally, no added sounds  CVS: S1 S2 regular, no murmurs.  Abdomen: Bowel sounds  present, Mildly tender in B/L flank area and b/l groin area, but soft and not distended with no gaurding, rigidity or rebound. Extremities: B/L Lower Ext shows no edema, both legs are warm to touch Neurology: Difficult exam given pain-Lower Ext B/L-3+/5. Upper Ext 5/5. Sensation is grossly intact Skin:No Rash Wounds:N/A  LABS ON ADMISSION:   Recent Labs  03/15/14 0137  NA 133*  K 4.1  CL 92*  CO2 21  GLUCOSE 146*  BUN 21  CREATININE 0.81  CALCIUM 9.5    Recent Labs  03/15/14 0137  AST 24  ALT 19  ALKPHOS 71  BILITOT 1.1  PROT 8.3  ALBUMIN 4.4   No results found for this basename: LIPASE, AMYLASE,  in the last 72 hours  Recent Labs  03/15/14  0137  WBC 12.6*  NEUTROABS 10.4*  HGB 12.2*  HCT 38.0*  MCV 90.7  PLT 129*   No results found for this basename: CKTOTAL, CKMB, CKMBINDEX, TROPONINI,  in the last 72 hours No results found for this basename: DDIMER,  in the last 72 hours No components found with this basename: POCBNP,    RADIOLOGIC STUDIES ON ADMISSION: Ct Lumbar Spine Wo Contrast  03/15/2014   CLINICAL DATA:  Dysuria and back pain  EXAM: CT LUMBAR SPINE WITHOUT CONTRAST  TECHNIQUE: Multidetector CT imaging of the lumbar spine was performed without intravenous contrast administration. Multiplanar CT image reconstructions were also generated.  COMPARISON:  03/10/2010 abdominal CT  FINDINGS: Postsurgical changes include L5-S1 posterior lumbar interbody fusion with rod and pedicle screw fixation. There is complete osseous fusion across the discectomy. No evidence of hardware fracture or displacement. There has also been a partially visualized T11 corpectomy with lateral plate and screw fixation extending to T12. There is a complete bony fusion where visualized. No hardware displacement. There is a dorsal column stimulator which enters the canal at the T11-12 and T12-L1 posterior interspace. No evidence of lead discontinuity. The battery pack is on the left.  There is  remote superior endplate fracture of L2. No acute fracture. No change in spinal alignment compared to 03/10/2010, as noted below.  Degenerative changes:  T11-12: There is ossification of the posterior ligaments and facet overgrowth which causes advanced narrowing of the spinal canal from posterior. This is stable from 2011.  T12- L1: Degenerative disc disease with vacuum phenomenon and disc narrowing. No notable or progressive canal or foraminal stenosis.  L1-L2: Marked degenerative disc narrowing with disc bulging. These changes crowds the inferior foramina. There is mild bony spinal canal narrowing.  L2-L3: Mild retrolisthesis and advanced facet osteoarthritis with bony overgrowth. These changes result in triangular deformation of the spinal canal consistent with at least moderate stenosis. These changes also result in bilateral foraminal stenosis, left more than right.  L3-L4: Chronic retrolisthesis with advanced facet osteoarthritis. Endplate spurs, disc bulge, and facet overgrowth causes triangular deformation of the spinal canal consistent with at least moderate spinal canal stenosis. There is bilateral foraminal narrowing.  L4-L5: Chronic retrolisthesis. There is advanced facet osteoarthritis with spurs. Slip and spurring causes bilateral foraminal stenosis. Retrolisthesis causes mild to moderate osseous canal narrowing.  L5-S1:No evidence of significant residual spinal canal stenosis after laminectomy. Fixed anterolisthesis crowds both foramen.  IMPRESSION: 1. No acute osseous findings. 2. Advanced degenerative disc and facet disease with multilevel subluxation. There is multilevel spinal canal and neural foraminal stenosis, as above. Degenerative changes are similar to abdominal CT 03/10/2010.   Electronically Signed   By: Tiburcio Pea M.D.   On: 03/15/2014 03:48    ASSESSMENT AND PLAN: Present on Admission:  . Intractable low back pain - Suspect lumbar radiculopathy, complicated patient with prior  history of lumbar laminectomy and spinal cord stability implantation. Physical exam is limited by pain, but has some mild weakness in his lower extremities, CT of the lumbar spine shows significant degenerative disease (please see report). Will admit to a medical surgical unit, provide supportive care, start empiric Decadron. Will consult/touch base with neurosurgery-left message for Dr Franky Macho with office. - PT eval.  . CAD, NATIVE VESSEL - Continue aspirin, Coreg and statin  - Currently without any chest pain or shortness of breath  . Diastolic CHF, chronic - Clinically compensated.  - Follow   . Essential hypertension, benign - Continue Coreg, Lisinopril,Imdur - follow  BP trend   . HYPERLIPIDEMIA-MIXED - Continue statins   . Microscopic hematuria - Check urine culture, does have mild leukocytosis-however is afebrile. Watch off antibiotics for now, may need to repeat CT scan of the abdomen-but can be done as an outpatient.  Further plan will depend as patient's clinical course evolves and further radiologic and laboratory data become available. Patient will be monitored closely.  Above noted plan was discussed with patient,he was in agreement.   DVT Prophylaxis: Prophylactic  Heparin  Code Status: Full Code  Total time spent for admission equals 45 minutes.  [facility] Center[facility]IMIRE,Shalea Tomczak [facility] Pager [phone]  If 7PM-7AM, please contact night-coverage [email or web address] Password [email or web address] 03/15/2014, 9:42 AM  **Disclaimer: This note may have been dictated with voice recognition software. Similar sounding words can inadvertently be transcribed and this note may contain transcription errors which may not have been corrected upon publication of note.**

## 2014-03-15 NOTE — Consult Note (Signed)
Reason for Consult: Back pain Referring Physician: Dr. Darcus Pester Knittle is an 63 y.o. male.  HPI: [name] is a 63 year old white male on whom I previously performed an anterior cervical discectomy, fusion, and plating as well as a T10-T12 anterior instrumentation and fusion. The patient has also had a L5-S1 fusion by Dr.Cohen in orthopedics surgeon who practices in [facility]. He's also had placement of spinal cord stimulator by a pain doctor in [place] (the patient can't remember his name)  The patient has had chronic back pain but it has recently worsened. He was admitted for pain control. The patient underwent a lumbar CT because he can't have an MRI because of the spinal cord stimulator. Dr. Nigel Bridgeman has asked me to see the patient.   Presently the patient is accompanied by his family. He complains of back pain with pain into his bilateral groin and into his right greater than left leg. His back bothers him more than his legs. He describes pain at the lumbosacral junction in the midline. His symptoms don't change much with standing versus sitting versus lying down. He says the spinal cord stim there has not helped him and he has not used it in years. He would like to have it removed.  Past Medical History  Diagnosis Date  . Arthritis   . CAD (coronary artery disease)     s/p prior PCI  . Hypertension   . Chronic diastolic CHF (congestive heart failure)   . Pulmonary emboli   . Complication of anesthesia   . PONV (postoperative nausea and vomiting)   . Shortness of breath     Past Surgical History  Procedure Laterality Date  . Carpal tunnel release      Bilateral  . Rotator cuff repair    . Total knee arthroplasty      Bilateral  . Foot surgery      Right  . Ankle surgery      Fusion  . Back surgery      x3  . Pacemaker insertion      Greenfield filter  . Thoracotomy      For access to a G86-76 fusion complicated by hemothorax and 44 day hospitalization  .  Coronary angioplasty with stent placement      Stenting of the RCA as well as other stenting procedures in the past    Family History  Problem Relation Age of Onset  . Heart attack Mother   . Coronary artery disease Mother   . Lung disease Father     Social History:  reports that he quit smoking about 43 years ago. He has never used smokeless tobacco. He reports that he does not drink alcohol or use illicit drugs.  Allergies: No Known Allergies  Medications:  I have reviewed the patient's current medications. Prior to Admission:  Prescriptions prior to admission  Medication Sig Dispense Refill  . aspirin 81 MG tablet Take 81 mg by mouth daily.        . carvedilol (COREG) 12.5 MG tablet Take 1 tablet (12.5 mg total) by mouth 2 (two) times daily.  180 tablet  1  . doxycycline (DORYX) 100 MG DR capsule Take 100 mg by mouth 2 (two) times daily.        . fentaNYL (DURAGESIC - DOSED MCG/HR) 75 MCG/HR Place 75 mcg onto the skin every 3 (three) days.      . furosemide (LASIX) 20 MG tablet Take 20 mg by mouth daily as needed  for fluid.       . hydrochlorothiazide (HYDRODIURIL) 25 MG tablet Take 1 tablet (25 mg total) by mouth daily.  90 tablet  1  . isosorbide mononitrate (IMDUR) 30 MG 24 hr tablet Take 1 tablet (30 mg total) by mouth daily.  90 tablet  1  . lisinopril (PRINIVIL,ZESTRIL) 40 MG tablet Take 1 tablet (40 mg total) by mouth daily.  90 tablet  1  . nitroGLYCERIN (NITROSTAT) 0.4 MG SL tablet Place 0.4 mg under the tongue every 5 (five) minutes as needed for chest pain.      Marland Kitchen oxyCODONE-acetaminophen (PERCOCET) 10-325 MG per tablet Take 1 tablet by mouth every 4 (four) hours as needed for pain.       . potassium chloride (KLOR-CON 10) 10 MEQ tablet Take 1 tablet (10 mEq total) by mouth 2 (two) times daily.  180 tablet  1  . pravastatin (PRAVACHOL) 80 MG tablet Take 1 tablet (80 mg total) by mouth daily.  90 tablet  1   Scheduled: . sodium chloride   Intravenous STAT  . aspirin EC   81 mg Oral Daily  . carvedilol  12.5 mg Oral BID  . dexamethasone  4 mg Intravenous 3 times per day  . fentaNYL  75 mcg Transdermal Q72H  . heparin  5,000 Units Subcutaneous 3 times per day  . hydrochlorothiazide  25 mg Oral Daily  . isosorbide mononitrate  30 mg Oral Daily  . lisinopril  40 mg Oral Daily  . polyethylene glycol  17 g Oral Daily  . potassium chloride  10 mEq Oral BID  . senna-docusate  2 tablet Oral QHS  . simvastatin  40 mg Oral q1800  . sodium chloride  3 mL Intravenous Q12H   Continuous:  XVQ:MGQQPY chloride, albuterol, alum & mag hydroxide-simeth, guaiFENesin-dextromethorphan, HYDROmorphone (DILAUDID) injection, methocarbamol (ROBAXIN) IV, nitroGLYCERIN, ondansetron (ZOFRAN) IV, ondansetron, oxyCODONE-acetaminophen, sodium chloride Anti-infectives   None       Results for orders placed during the hospital encounter of 03/15/14 (from the past 48 hour(s))  URINALYSIS, ROUTINE W REFLEX MICROSCOPIC     Status: Abnormal   Collection Time    03/14/14 10:41 PM      Result Value Ref Range   Color, Urine YELLOW  YELLOW   APPearance CLOUDY (*) CLEAR   Specific Gravity, Urine 1.012  1.005 - 1.030   pH 5.0  5.0 - 8.0   Glucose, UA NEGATIVE  NEGATIVE mg/dL   Hgb urine dipstick LARGE (*) NEGATIVE   Bilirubin Urine NEGATIVE  NEGATIVE   Ketones, ur NEGATIVE  NEGATIVE mg/dL   Protein, ur NEGATIVE  NEGATIVE mg/dL   Urobilinogen, UA 0.2  0.0 - 1.0 mg/dL   Nitrite NEGATIVE  NEGATIVE   Leukocytes, UA NEGATIVE  NEGATIVE  URINE MICROSCOPIC-ADD ON     Status: Abnormal   Collection Time    03/14/14 10:41 PM      Result Value Ref Range   WBC, UA 0-2  <3 WBC/hpf   RBC / HPF 21-50  <3 RBC/hpf   Bacteria, UA RARE  RARE   Casts HYALINE CASTS (*) NEGATIVE  CBC WITH DIFFERENTIAL     Status: Abnormal   Collection Time    03/15/14  1:37 AM      Result Value Ref Range   WBC 12.6 (*) 4.0 - 10.5 K/uL   RBC 4.19 (*) 4.22 - 5.81 MIL/uL   Hemoglobin 12.2 (*) 13.0 - 17.0 g/dL   HCT  38.0 (*) 39.0 - 52.0 %  MCV 90.7  78.0 - 100.0 fL   MCH 29.1  26.0 - 34.0 pg   MCHC 32.1  30.0 - 36.0 g/dL   RDW 14.8  11.5 - 15.5 %   Platelets 129 (*) 150 - 400 K/uL   Neutrophils Relative % 82 (*) 43 - 77 %   Neutro Abs 10.4 (*) 1.7 - 7.7 K/uL   Lymphocytes Relative 8 (*) 12 - 46 %   Lymphs Abs 1.0  0.7 - 4.0 K/uL   Monocytes Relative 9  3 - 12 %   Monocytes Absolute 1.1 (*) 0.1 - 1.0 K/uL   Eosinophils Relative 1  0 - 5 %   Eosinophils Absolute 0.1  0.0 - 0.7 K/uL   Basophils Relative 0  0 - 1 %   Basophils Absolute 0.0  0.0 - 0.1 K/uL  COMPREHENSIVE METABOLIC PANEL     Status: Abnormal   Collection Time    03/15/14  1:37 AM      Result Value Ref Range   Sodium 133 (*) 137 - 147 mEq/L   Potassium 4.1  3.7 - 5.3 mEq/L   Chloride 92 (*) 96 - 112 mEq/L   CO2 21  19 - 32 mEq/L   Glucose, Bld 146 (*) 70 - 99 mg/dL   BUN 21  6 - 23 mg/dL   Creatinine, Ser 0.81  0.50 - 1.35 mg/dL   Calcium 9.5  8.4 - 10.5 mg/dL   Total Protein 8.3  6.0 - 8.3 g/dL   Albumin 4.4  3.5 - 5.2 g/dL   AST 24  0 - 37 U/L   ALT 19  0 - 53 U/L   Alkaline Phosphatase 71  39 - 117 U/L   Total Bilirubin 1.1  0.3 - 1.2 mg/dL   GFR calc non Af Amer >90  >90 mL/min   GFR calc Af Amer >90  >90 mL/min   Comment: (NOTE)     The eGFR has been calculated using the CKD EPI equation.     This calculation has not been validated in all clinical situations.     eGFR's persistently <90 mL/min signify possible Chronic Kidney     Disease.   Anion gap 20 (*) 5 - 15    Ct Lumbar Spine Wo Contrast  03/15/2014   CLINICAL DATA:  Dysuria and back pain  EXAM: CT LUMBAR SPINE WITHOUT CONTRAST  TECHNIQUE: Multidetector CT imaging of the lumbar spine was performed without intravenous contrast administration. Multiplanar CT image reconstructions were also generated.  COMPARISON:  03/10/2010 abdominal CT  FINDINGS: Postsurgical changes include L5-S1 posterior lumbar interbody fusion with rod and pedicle screw fixation. There is  complete osseous fusion across the discectomy. No evidence of hardware fracture or displacement. There has also been a partially visualized T11 corpectomy with lateral plate and screw fixation extending to T12. There is a complete bony fusion where visualized. No hardware displacement. There is a dorsal column stimulator which enters the canal at the T11-12 and T12-L1 posterior interspace. No evidence of lead discontinuity. The battery pack is on the left.  There is remote superior endplate fracture of L2. No acute fracture. No change in spinal alignment compared to 03/10/2010, as noted below.  Degenerative changes:  T11-12: There is ossification of the posterior ligaments and facet overgrowth which causes advanced narrowing of the spinal canal from posterior. This is stable from 2011.  T12- L1: Degenerative disc disease with vacuum phenomenon and disc narrowing. No notable or progressive canal or foraminal  stenosis.  L1-L2: Marked degenerative disc narrowing with disc bulging. These changes crowds the inferior foramina. There is mild bony spinal canal narrowing.  L2-L3: Mild retrolisthesis and advanced facet osteoarthritis with bony overgrowth. These changes result in triangular deformation of the spinal canal consistent with at least moderate stenosis. These changes also result in bilateral foraminal stenosis, left more than right.  L3-L4: Chronic retrolisthesis with advanced facet osteoarthritis. Endplate spurs, disc bulge, and facet overgrowth causes triangular deformation of the spinal canal consistent with at least moderate spinal canal stenosis. There is bilateral foraminal narrowing.  L4-L5: Chronic retrolisthesis. There is advanced facet osteoarthritis with spurs. Slip and spurring causes bilateral foraminal stenosis. Retrolisthesis causes mild to moderate osseous canal narrowing.  L5-S1:No evidence of significant residual spinal canal stenosis after laminectomy. Fixed anterolisthesis crowds both foramen.   IMPRESSION: 1. No acute osseous findings. 2. Advanced degenerative disc and facet disease with multilevel subluxation. There is multilevel spinal canal and neural foraminal stenosis, as above. Degenerative changes are similar to abdominal CT 03/10/2010.   Electronically Signed   By: Jorje Guild M.D.   On: 03/15/2014 03:48    ROS: As above Blood pressure 147/87, pulse 69, temperature 98.5 F (36.9 C), temperature source Oral, resp. rate 22, height 5' 11"  (1.803 m), weight 117.935 kg (260 lb), SpO2 99.00%. Physical Exam  General: An obese 63 year old white male who complains of back pain.  HEENT: Normocephalic, atraumatic, extraocular muscles intact,  Neck: Supple, his cervical incision is well-healed.  Thorax: Symmetric  Abdomen: Obese  Extremities: He has bilateral knee replacement incisions which are well-healed  Back exam: The patient's lumbar incision is well-healed. He is diffusely tender to palpation at the lumbosacral junction. There is no point tenderness or deformities. Corky Sox testing is negative bilaterally.  Neurologic exam: The patient is alert and oriented x3. Cranial nerves II through XII are grossly normal. His motor strength is grossly normal his upper extremities. His motor strength in his lower extremity is 5 over 5 his bilateral quadriceps, gastrocnemius, left EHL. He has slight weakness in his right EHL at 4+ over 5.  I reviewed the patient's lumbar CT performed at [facility] on 03/15/2014. The patient has had a lumbar fusion at L5-S1 and a fusion at T10-T12. He has a spinal cord stimulator in place. He has multilevel degenerative changes and vacuum disc phenomenon. He appears to have some spinal stenosis although is difficult to assess the degree as this is a noncontrasted study.  Assessment/Plan: Lumbago, lumbar radiculopathy, hip pain: I have discussed situation with the patient and his family. We discussed further workup with a lumbar myelo CT as he  cannot get a lumbar MRI because of his spinal cord stimulator. He will likely proceed with that study. I will also recommend hip x-rays given his complaints of groin pain. I will arrange for the studies.  Altonio Schwertner D 03/15/2014, 6:09 PM

## 2014-03-15 NOTE — ED Notes (Signed)
Report attempted 

## 2014-03-15 NOTE — Progress Notes (Signed)
Utilization review completed.  

## 2014-03-15 NOTE — Progress Notes (Signed)
MD made aware of the admission via amion.

## 2014-03-15 NOTE — ED Provider Notes (Signed)
CSN: [id number]     Arrival date & time 03/14/14  2230 History   First MD Initiated Contact with Patient 03/15/14 [phone]     Chief Complaint  Patient presents with  . Dysuria  . Back Pain     (Consider location/radiation/quality/duration/timing/severity/associated sxs/prior Treatment) HPI Patient has a history of chronic back pain. He presents with worsening pain for the past 24 hours. States he woke up yesterday morning and was having difficulty getting out of bed. He complains of increased numbness in bilateral legs. He's had difficulty with urinating and defecating. He denies any fevers or chills. Pain is in the lower back and it goes down both legs. No recent trauma. Patient has a neurostimulator in place for his chronic low back pain. Past Medical History  Diagnosis Date  . Arthritis   . CAD (coronary artery disease)     s/p prior PCI  . Hypertension   . Chronic diastolic CHF (congestive heart failure)   . Pulmonary emboli    Past Surgical History  Procedure Laterality Date  . Carpal tunnel release      Bilateral  . Rotator cuff repair    . Total knee arthroplasty      Bilateral  . Foot surgery      Right  . Ankle surgery      Fusion  . Back surgery      x3  . Pacemaker insertion      Greenfield filter  . Thoracotomy      For access to a T10-12 fusion complicated by hemothorax and 44 day hospitalization  . Coronary angioplasty with stent placement      Stenting of the RCA as well as other stenting procedures in the past   Family History  Problem Relation Age of Onset  . Heart attack Mother   . Coronary artery disease Mother   . Lung disease Father    History  Substance Use Topics  . Smoking status: Former Games developer  . Smokeless tobacco: Not on file     Comment: quit 35 years ago  . Alcohol Use: No    Review of Systems  Constitutional: Negative for fever and chills.  Respiratory: Negative for shortness of breath.   Cardiovascular: Negative for chest pain.   Gastrointestinal: Positive for nausea, vomiting and constipation. Negative for abdominal pain, diarrhea and abdominal distention.  Genitourinary: Positive for difficulty urinating. Negative for dysuria, frequency and flank pain.  Musculoskeletal: Positive for back pain. Negative for myalgias, neck pain and neck stiffness.  Skin: Negative for rash and wound.  Neurological: Positive for weakness and numbness. Negative for dizziness, light-headedness and headaches.  All other systems reviewed and are negative.     Allergies  Review of patient's allergies indicates no known allergies.  Home Medications   Prior to Admission medications   Medication Sig Start Date End Date Taking? Authorizing Provider  aspirin 81 MG tablet Take 81 mg by mouth daily.     Yes Historical Provider, MD  carvedilol (COREG) 12.5 MG tablet Take 1 tablet (12.5 mg total) by mouth 2 (two) times daily. 02/12/14  Yes Kathleene Hazel, MD  doxycycline (DORYX) 100 MG DR capsule Take 100 mg by mouth 2 (two) times daily.     Yes Historical Provider, MD  fentaNYL (DURAGESIC - DOSED MCG/HR) 75 MCG/HR Place 75 mcg onto the skin every 3 (three) days.   Yes Historical Provider, MD  furosemide (LASIX) 20 MG tablet Take 20 mg by mouth daily as needed for fluid.  Yes Historical Provider, MD  hydrochlorothiazide (HYDRODIURIL) 25 MG tablet Take 1 tablet (25 mg total) by mouth daily. 02/12/14  Yes Kathleene Hazel, MD  isosorbide mononitrate (IMDUR) 30 MG 24 hr tablet Take 1 tablet (30 mg total) by mouth daily. 02/12/14  Yes Kathleene Hazel, MD  lisinopril (PRINIVIL,ZESTRIL) 40 MG tablet Take 1 tablet (40 mg total) by mouth daily. 02/12/14  Yes Kathleene Hazel, MD  nitroGLYCERIN (NITROSTAT) 0.4 MG SL tablet Place 0.4 mg under the tongue every 5 (five) minutes as needed for chest pain.   Yes Historical Provider, MD  oxyCODONE-acetaminophen (PERCOCET) 10-325 MG per tablet Take 1 tablet by mouth every 4 (four) hours  as needed for pain.  11/13/13  Yes Historical Provider, MD  potassium chloride (KLOR-CON 10) 10 MEQ tablet Take 1 tablet (10 mEq total) by mouth 2 (two) times daily. 02/12/14 02/12/15 Yes Kathleene Hazel, MD  pravastatin (PRAVACHOL) 80 MG tablet Take 1 tablet (80 mg total) by mouth daily. 02/22/14  Yes Kathleene Hazel, MD   BP 113/61  Pulse 86  Temp(Src) 98.1 F (36.7 C) (Oral)  Resp 17  Ht 5\' 11"  (1.803 m)  Wt 260 lb (117.935 kg)  BMI 36.28 kg/m2  SpO2 96% Physical Exam  Nursing note and vitals reviewed. Constitutional: He is oriented to person, place, and time. He appears well-developed and well-nourished. He appears distressed.  Patient is obese and in moderate distress.  HENT:  Head: Normocephalic and atraumatic.  Mouth/Throat: Oropharynx is clear and moist.  Eyes: EOM are normal. Pupils are equal, round, and reactive to light.  Neck: Normal range of motion. Neck supple.  Cardiovascular: Normal rate and regular rhythm.   Pulmonary/Chest: Effort normal and breath sounds normal. No respiratory distress. He has no wheezes. He has no rales.  Abdominal: Soft. Bowel sounds are normal. He exhibits no distension and no mass. There is tenderness (suprapubic tenderness to palpation.). There is no rebound and no guarding.  Musculoskeletal: Normal range of motion. He exhibits tenderness (mild tenderness to palpation in the lumbosacral area, right greater than left.). He exhibits no edema.  Negative straight leg raise. 2+ dorsalis pedis pulses bilaterally. No calf tenderness or swelling.  Neurological: He is alert and oriented to person, place, and time.  5/5 motor in bilateral lower extremities. Good dorsi and plantarflexion of the feet bilaterally. No appreciable numbness in the lower extremities.   Skin: Skin is warm and dry. No rash noted. No erythema.  Psychiatric: He has a normal mood and affect. His behavior is normal.    ED Course  Procedures (including critical care  time) Labs Review Labs Reviewed  URINALYSIS, ROUTINE W REFLEX MICROSCOPIC - Abnormal; Notable for the following:    APPearance CLOUDY (*)    Hgb urine dipstick LARGE (*)    All other components within normal limits  URINE MICROSCOPIC-ADD ON - Abnormal; Notable for the following:    Casts HYALINE CASTS (*)    All other components within normal limits  CBC WITH DIFFERENTIAL - Abnormal; Notable for the following:    WBC 12.6 (*)    RBC 4.19 (*)    Hemoglobin 12.2 (*)    HCT 38.0 (*)    Platelets 129 (*)    Neutrophils Relative % 82 (*)    Neutro Abs 10.4 (*)    Lymphocytes Relative 8 (*)    Monocytes Absolute 1.1 (*)    All other components within normal limits  COMPREHENSIVE METABOLIC PANEL - Abnormal; Notable for  the following:    Sodium 133 (*)    Chloride 92 (*)    Glucose, Bld 146 (*)    Anion gap 20 (*)    All other components within normal limits    Imaging Review No results found.   EKG Interpretation None      MDM   Final diagnoses:  None    Patient with neuro stimulator in place. Will get CT of the lumbar spine. Patient be treated with pain medication and monitor closely.  Patient continues to have pain despite multiple doses of pain medication. He also is vomiting. Will discuss admission with [facility] for intractable vomiting and pain.   Discussed with Dr. Jenkins. WLovell Sheehanill admit to observation bed.  Loren Raceravid Kemberly Taves, MD 03/15/14 0600

## 2014-03-16 ENCOUNTER — Inpatient Hospital Stay (HOSPITAL_COMMUNITY): Payer: Medicare Other

## 2014-03-16 DIAGNOSIS — R109 Unspecified abdominal pain: Secondary | ICD-10-CM

## 2014-03-16 DIAGNOSIS — R52 Pain, unspecified: Secondary | ICD-10-CM

## 2014-03-16 DIAGNOSIS — I82409 Acute embolism and thrombosis of unspecified deep veins of unspecified lower extremity: Secondary | ICD-10-CM

## 2014-03-16 DIAGNOSIS — R1013 Epigastric pain: Secondary | ICD-10-CM | POA: Diagnosis not present

## 2014-03-16 DIAGNOSIS — E669 Obesity, unspecified: Secondary | ICD-10-CM | POA: Diagnosis present

## 2014-03-16 DIAGNOSIS — E872 Acidosis, unspecified: Secondary | ICD-10-CM

## 2014-03-16 DIAGNOSIS — R3129 Other microscopic hematuria: Secondary | ICD-10-CM

## 2014-03-16 LAB — URINE CULTURE
Colony Count: NO GROWTH
Culture: NO GROWTH

## 2014-03-16 LAB — CBC
HCT: 35.1 % — ABNORMAL LOW (ref 39.0–52.0)
HCT: 34.7 % — ABNORMAL LOW (ref 39.0–52.0)
Hemoglobin: 11.4 g/dL — ABNORMAL LOW (ref 13.0–17.0)
Hemoglobin: 11.2 g/dL — ABNORMAL LOW (ref 13.0–17.0)
MCH: 29.1 pg (ref 26.0–34.0)
MCH: 29.5 pg (ref 26.0–34.0)
MCHC: 32.5 g/dL (ref 30.0–36.0)
MCHC: 32.3 g/dL (ref 30.0–36.0)
MCV: 89.5 fL (ref 78.0–100.0)
MCV: 91.3 fL (ref 78.0–100.0)
PLATELETS: 118 10*3/uL — AB (ref 150–400)
PLATELETS: 96 10*3/uL — AB (ref 150–400)
RBC: 3.92 MIL/uL — ABNORMAL LOW (ref 4.22–5.81)
RBC: 3.8 MIL/uL — AB (ref 4.22–5.81)
RDW: 15 % (ref 11.5–15.5)
RDW: 15 % (ref 11.5–15.5)
WBC: 16.4 10*3/uL — ABNORMAL HIGH (ref 4.0–10.5)
WBC: 19.5 10*3/uL — AB (ref 4.0–10.5)

## 2014-03-16 LAB — BASIC METABOLIC PANEL
Anion gap: 17 — ABNORMAL HIGH (ref 5–15)
BUN: 25 mg/dL — AB (ref 6–23)
CO2: 24 mEq/L (ref 19–32)
Calcium: 9.7 mg/dL (ref 8.4–10.5)
Chloride: 93 mEq/L — ABNORMAL LOW (ref 96–112)
Creatinine, Ser: 0.85 mg/dL (ref 0.50–1.35)
Glucose, Bld: 154 mg/dL — ABNORMAL HIGH (ref 70–99)
Potassium: 4.7 mEq/L (ref 3.7–5.3)
SODIUM: 134 meq/L — AB (ref 137–147)

## 2014-03-16 LAB — SALICYLATE LEVEL

## 2014-03-16 LAB — LACTIC ACID, PLASMA: LACTIC ACID, VENOUS: 1.5 mmol/L (ref 0.5–2.2)

## 2014-03-16 LAB — TROPONIN I

## 2014-03-16 MED ORDER — ALUM & MAG HYDROXIDE-SIMETH 200-200-20 MG/5ML PO SUSP
30.0000 mL | Freq: Once | ORAL | Status: AC
  Administered 2014-03-16: 30 mL via ORAL
  Filled 2014-03-16: qty 30

## 2014-03-16 MED ORDER — KETOROLAC TROMETHAMINE 15 MG/ML IJ SOLN
15.0000 mg | Freq: Once | INTRAMUSCULAR | Status: AC
  Administered 2014-03-16: 15 mg via INTRAVENOUS
  Filled 2014-03-16: qty 1

## 2014-03-16 MED ORDER — SODIUM CHLORIDE 0.9 % IV SOLN
INTRAVENOUS | Status: DC
  Administered 2014-03-16 – 2014-03-18 (×3): via INTRAVENOUS

## 2014-03-16 MED ORDER — KETOROLAC TROMETHAMINE 30 MG/ML IJ SOLN: 30.0000 mg | Freq: Four times a day (QID) | INTRAMUSCULAR | Status: DC | PRN

## 2014-03-16 MED ORDER — IOHEXOL 180 MG/ML  SOLN: 20.0000 mL | Freq: Once | INTRAMUSCULAR | Status: AC | PRN

## 2014-03-16 MED ORDER — HYDROMORPHONE HCL PF 1 MG/ML IJ SOLN
1.0000 mg | INTRAMUSCULAR | Status: DC | PRN
  Administered 2014-03-16: 1 mg via INTRAVENOUS
  Administered 2014-03-16: 2 mg via INTRAVENOUS
  Filled 2014-03-16: qty 1
  Filled 2014-03-16 (×2): qty 2
  Filled 2014-03-16: qty 1

## 2014-03-16 MED ORDER — HEPARIN BOLUS VIA INFUSION
5000.0000 [IU] | Freq: Once | INTRAVENOUS | Status: AC
  Administered 2014-03-16: 5000 [IU] via INTRAVENOUS
  Filled 2014-03-16: qty 5000

## 2014-03-16 MED ORDER — HYDROMORPHONE HCL PF 1 MG/ML IJ SOLN
1.0000 mg | INTRAMUSCULAR | Status: DC | PRN
  Administered 2014-03-16 – 2014-03-17 (×6): 1 mg via INTRAVENOUS
  Administered 2014-03-17: 2 mg via INTRAVENOUS
  Filled 2014-03-16 (×9): qty 1

## 2014-03-16 MED ORDER — OXYCODONE HCL ER 10 MG PO T12A
10.0000 mg | EXTENDED_RELEASE_TABLET | Freq: Two times a day (BID) | ORAL | Status: DC
  Administered 2014-03-16 – 2014-03-17 (×2): 10 mg via ORAL
  Filled 2014-03-16 (×2): qty 1

## 2014-03-16 MED ORDER — HEPARIN (PORCINE) IN NACL 100-0.45 UNIT/ML-% IJ SOLN
1700.0000 [IU]/h | INTRAMUSCULAR | Status: AC
  Administered 2014-03-16: 1700 [IU]/h via INTRAVENOUS
  Administered 2014-03-17: 1400 [IU]/h via INTRAVENOUS
  Administered 2014-03-18: 1250 [IU]/h via INTRAVENOUS
  Filled 2014-03-16 (×6): qty 250

## 2014-03-16 NOTE — Progress Notes (Signed)
Patient ID: [name], male   DOB: 1951/07/26, 63 y.o.   MRN: [id number] Subjective:  The Patient is writhing in pain complaining of groin pain.  Objective: Vital signs in last 24 hours: Temp:  [98.1 F (36.7 C)-98.5 F (36.9 C)] 98.2 F (36.8 C) (07/17 0550) Pulse Rate:  [69-78] 78 (07/17 0550) Resp:  [18-22] 20 (07/17 0550) BP: (93-147)/(57-87) 127/75 mmHg (07/17 0550) SpO2:  [93 %-99 %] 99 % (07/17 0550)  Intake/Output from previous day: 07/16 0701 - 07/17 0700 In: 1007.5 [P.O.:440; I.V.:567.5] Out: 1300 [Urine:1000; Emesis/NG output:300] Intake/Output this shift: Total I/O In: -  Out: 100 [Urine:100]  Physical exam patient is moving his lower extremities well.  The patient's hip x-rays are unremarkable.  Lab Results:  Recent Labs  03/15/14 0137 03/16/14 0540  WBC 12.6* 16.4*  HGB 12.2* 11.4*  HCT 38.0* 35.1*  PLT 129* 118*   BMET  Recent Labs  03/15/14 0137 03/16/14 0540  NA 133* 134*  K 4.1 4.7  CL 92* 93*  CO2 21 24  GLUCOSE 146* 154*  BUN 21 25*  CREATININE 0.81 0.85  CALCIUM 9.5 9.7    Studies/Results: Dg Hip Bilateral W/pelvis  03/15/2014   CLINICAL DATA:  Hip pain  EXAM: BILATERAL HIP WITH PELVIS - 4+ VIEW  COMPARISON:  CT abdomen pelvis - 03/10/2010  FINDINGS: No fracture or dislocation. Bilateral hip joint spaces are preserved. No evidence of avascular necrosis. There is minimal enthesopathic change of the bilateral lesser trochanters.  Limited visualization of the bilateral SI joints and pubic symphysis is normal.  Post lower lumbar paraspinal fusion, incompletely evaluated. A presumed neural spinal stimulator device overlies the left mid abdomen, incompletely evaluated. Post IVC filter placement.  IMPRESSION: No fracture or dislocation.   Electronically Signed   By: Simonne ComeJohn  Watts M.D.   On: 03/15/2014 20:25   Ct Lumbar Spine Wo Contrast  03/15/2014   CLINICAL DATA:  Dysuria and back pain  EXAM: CT LUMBAR SPINE WITHOUT CONTRAST  TECHNIQUE:  Multidetector CT imaging of the lumbar spine was performed without intravenous contrast administration. Multiplanar CT image reconstructions were also generated.  COMPARISON:  03/10/2010 abdominal CT  FINDINGS: Postsurgical changes include L5-S1 posterior lumbar interbody fusion with rod and pedicle screw fixation. There is complete osseous fusion across the discectomy. No evidence of hardware fracture or displacement. There has also been a partially visualized T11 corpectomy with lateral plate and screw fixation extending to T12. There is a complete bony fusion where visualized. No hardware displacement. There is a dorsal column stimulator which enters the canal at the T11-12 and T12-L1 posterior interspace. No evidence of lead discontinuity. The battery pack is on the left.  There is remote superior endplate fracture of L2. No acute fracture. No change in spinal alignment compared to 03/10/2010, as noted below.  Degenerative changes:  T11-12: There is ossification of the posterior ligaments and facet overgrowth which causes advanced narrowing of the spinal canal from posterior. This is stable from 2011.  T12- L1: Degenerative disc disease with vacuum phenomenon and disc narrowing. No notable or progressive canal or foraminal stenosis.  L1-L2: Marked degenerative disc narrowing with disc bulging. These changes crowds the inferior foramina. There is mild bony spinal canal narrowing.  L2-L3: Mild retrolisthesis and advanced facet osteoarthritis with bony overgrowth. These changes result in triangular deformation of the spinal canal consistent with at least moderate stenosis. These changes also result in bilateral foraminal stenosis, left more than right.  L3-L4: Chronic retrolisthesis with advanced  facet osteoarthritis. Endplate spurs, disc bulge, and facet overgrowth causes triangular deformation of the spinal canal consistent with at least moderate spinal canal stenosis. There is bilateral foraminal narrowing.   L4-L5: Chronic retrolisthesis. There is advanced facet osteoarthritis with spurs. Slip and spurring causes bilateral foraminal stenosis. Retrolisthesis causes mild to moderate osseous canal narrowing.  L5-S1:No evidence of significant residual spinal canal stenosis after laminectomy. Fixed anterolisthesis crowds both foramen.  IMPRESSION: 1. No acute osseous findings. 2. Advanced degenerative disc and facet disease with multilevel subluxation. There is multilevel spinal canal and neural foraminal stenosis, as above. Degenerative changes are similar to abdominal CT 03/10/2010.   Electronically Signed   By: Tiburcio Pea M.D.   On: 03/15/2014 03:48    Assessment/Plan: Groin pain: Groin pain is very atypical from back problems. Think he may have some sort of urologic issue, kidney stones, etc. but I don't think this is coming from his back. Further workup per the medical doctors.  Chronic lumbago: His CT scan demonstrated some diffuse degenerative changes but nothing acute. If his myelogram/CT demonstrates similar findings he can followup with me in the office for this.  LOS: 1 day     Lemoyne Nestor D 03/16/2014, 8:19 AM

## 2014-03-16 NOTE — Progress Notes (Addendum)
[facility] PROGRESS NOTE  Mark ClimesDavid W [name] ZOX:[id number] DOB: 1951-02-25 DOA: 03/15/2014 PCP: Desmond DikeAMERON,JOHN, MD  Assessment/Plan: Principal Problem:   Metabolic acidosis: Improved today. Noted worsening leukocytosis. Checking salicylate level, lactic acid level and troponin. Continue to monitor.? Secondary to renal stone obstruction versus cardiac-less likely  Active Problems:   Essential hypertension, benign: Pressure stable.   Obesity: Patient is criteria with BMI of 36   CAD, NATIVE VESSEL, status post stent placement.   HYPERLIPIDEMIA-MIXED   Diastolic CHF, chronic: Patient states no further problems with congestive heart failure. Echocardiogram in 07 negative. Continue to monitor.    Intractable low back pain: Nothing acute on CT or hip films. Discuss with neurosurgery and they feel process is not related to his chronic back pain. Currently patient complains of groin pain.    Microscopic hematuria,? Renal stone. See below.    Nausea with vomiting: Suspect secondary stone. Symptomatically treating.    Severe right groin pain: Main complaint. No longer in back. Given hematuria, suspect this could be kidney stone. Have ordered noncontrast CT. Pain itself however is unusual, he is significant tenderness in the right groin which he says goes down to his testes and even little to his thigh. Spoke with radiology. In checking CT, we'll go down to level of scrotum to confirm no hernia. None appreciated on exam. IV fluids and try dose of Toradol for pain control    Acute epigastric pain: Mistakenly refer to his chest pain. Suspect to be more indigestion from nausea and vomiting persistent. When necessary Maalox. Given metabolic acidosis, checking troponins EKG  Code Status: Full Family Communication: Spoke with wife by phone today Disposition Plan: Here for several days   Consultants:  Neurosurgery  Procedures:  CT myelogram pending  Abdominal pelvic noncontrast CT  pending  Antibiotics:  None  HPI/Subjective: Patient complains of significant right groin pain. He says it is only down into his testicles but also to some radiation into the right eye. No back pain. In regards to his chest pain, he localizes this to the midepigastric area, nonradiating. Mild nausea and vomiting.  Objective: Filed Vitals:   03/16/14 0550  BP: 127/75  Pulse: 78  Temp: 98.2 F (36.8 C)  Resp: 20    Intake/Output Summary (Last 24 hours) at 03/16/14 1205 Last data filed at 03/16/14 [id number]  Gross per 24 hour  Intake 1007.5 ml  Output   1300 ml  Net -292.5 ml   Filed Weights   03/14/14 2234  Weight: 117.935 kg (260 lb)    Exam:   General:  Mild distress secondary to groin pain  Cardiovascular: Regular rate and rhythm, S1-S2  Respiratory: Clear to auscultation bilaterally  Abdomen: Soft, nontender, nondistended, positive bowel sounds  Back: No CVA tenderness  Groin: Significant tenderness in the right groin femoral area with some mild extension to the upper thigh. No palpable mass in the groin or thigh or scrotum.  No tenderness of the testicles  Musculoskeletal: No clubbing or cyanosis or edema  Data Reviewed: Basic Metabolic Panel:  Recent Labs Lab 03/15/14 0137 03/16/14 0540  NA 133* 134*  K 4.1 4.7  CL 92* 93*  CO2 21 24  GLUCOSE 146* 154*  BUN 21 25*  CREATININE 0.81 0.85  CALCIUM 9.5 9.7   Liver Function Tests:  Recent Labs Lab 03/15/14 0137  AST 24  ALT 19  ALKPHOS 71  BILITOT 1.1  PROT 8.3  ALBUMIN 4.4   No results found for this basename: LIPASE, AMYLASE,  in  the last 168 hours No results found for this basename: AMMONIA,  in the last 168 hours CBC:  Recent Labs Lab 03/15/14 0137 03/16/14 0540  WBC 12.6* 16.4*  NEUTROABS 10.4*  --   HGB 12.2* 11.4*  HCT 38.0* 35.1*  MCV 90.7 89.5  PLT 129* 118*   Cardiac Enzymes: No results found for this basename: CKTOTAL, CKMB, CKMBINDEX, TROPONINI,  in the last 168  hours BNP (last 3 results) No results found for this basename: PROBNP,  in the last 8760 hours CBG: No results found for this basename: GLUCAP,  in the last 168 hours  No results found for this or any previous visit (from the past 240 hour(s)).   Studies: Dg Hip Bilateral W/pelvis  03/15/2014  IMPRESSION: No fracture or dislocation.   Electronically Signed   By: Simonne Come M.D.   On: 03/15/2014 20:25   Ct Lumbar Spine Wo Contrast  03/15/2014     IMPRESSION: 1. No acute osseous findings. 2. Advanced degenerative disc and facet disease with multilevel subluxation. There is multilevel spinal canal and neural foraminal stenosis, as above. Degenerative changes are similar to abdominal CT 03/10/2010.   Electronically Signed   By: Tiburcio Pea M.D.   On: 03/15/2014 03:48    Scheduled Meds: . alum & mag hydroxide-simeth  30 mL Oral Once  . aspirin EC  81 mg Oral Daily  . carvedilol  12.5 mg Oral BID  . dexamethasone  4 mg Intravenous 3 times per day  . fentaNYL  75 mcg Transdermal Q72H  . heparin  5,000 Units Subcutaneous 3 times per day  . hydrochlorothiazide  25 mg Oral Daily  . isosorbide mononitrate  30 mg Oral Daily  . ketorolac  15 mg Intravenous Once  . lisinopril  40 mg Oral Daily  . polyethylene glycol  17 g Oral Daily  . potassium chloride  10 mEq Oral BID  . senna-docusate  2 tablet Oral QHS  . simvastatin  40 mg Oral q1800  . sodium chloride  3 mL Intravenous Q12H   Continuous Infusions: . sodium chloride      Principal Problem:   Metabolic acidosis Active Problems:   Essential hypertension, benign   CAD, NATIVE VESSEL   HYPERLIPIDEMIA-MIXED   Diastolic CHF, chronic   Intractable low back pain   Microscopic hematuria   Nausea with vomiting   Severe right groin pain   Acute epigastric pain    Time spent: 35 minutes    Hollice Espy  [facility] Pager [phone]. If 7PM-7AM, please contact night-coverage at [email or web address], password  [facility] 03/16/2014, 12:05 PM  LOS: 1 day

## 2014-03-16 NOTE — Progress Notes (Signed)
Pt complaining of "11/10" pain in bilateral groin. Pt sitting on side of bed writhing and vomiting. Gave IV pain medication and IV nausea medication (see eMAR) and notified night coverage MD regarding pt pain control. Pt states pain in back is significantly improved, however endorses a "sharp, stabbing pain" in bilateral groin. No redness/heat/swelling noted to groin. Pt also endorses no BM in 3 days, states gave himself suppositories prior to arrival with little effect. Will continue to monitor.

## 2014-03-16 NOTE — Progress Notes (Signed)
ANTICOAGULATION CONSULT NOTE - Initial Consult  Pharmacy Consult for Heparin Indication: VTE Treatment  No Known Allergies  Patient Measurements: Height: 5\' 11"  (180.3 cm) Weight: 260 lb (117.935 kg) IBW/kg (Calculated) : 75.3 Heparin Dosing Weight: 101.3 kg  Vital Signs: Temp: 98.6 F (37 C) (07/17 1342) Temp src: Oral (07/17 1342) BP: 108/65 mmHg (07/17 1342) Pulse Rate: 72 (07/17 1342)  Labs:  Recent Labs  03/15/14 0137 03/16/14 0540 03/16/14 1355  HGB 12.2* 11.4*  --   HCT 38.0* 35.1*  --   PLT 129* 118*  --   CREATININE 0.81 0.85  --   TROPONINI  --   --  <0.30    Estimated Creatinine Clearance: 117.6 ml/min (by C-G formula based on Cr of 0.85).   Medical History: Past Medical History  Diagnosis Date  . Arthritis   . CAD (coronary artery disease)     s/p prior PCI  . Hypertension   . Chronic diastolic CHF (congestive heart failure)   . Pulmonary emboli   . Complication of anesthesia   . PONV (postoperative nausea and vomiting)   . Shortness of breath     Medications:  Scheduled:  . aspirin EC  81 mg Oral Daily  . carvedilol  12.5 mg Oral BID  . dexamethasone  4 mg Intravenous 3 times per day  . fentaNYL  75 mcg Transdermal Q72H  . heparin  5,000 Units Subcutaneous 3 times per day  . hydrochlorothiazide  25 mg Oral Daily  . isosorbide mononitrate  30 mg Oral Daily  . lisinopril  40 mg Oral Daily  . polyethylene glycol  17 g Oral Daily  . potassium chloride  10 mEq Oral BID  . senna-docusate  2 tablet Oral QHS  . simvastatin  40 mg Oral q1800  . sodium chloride  3 mL Intravenous Q12H    Assessment: 63 YOM who had a bilateral lower extremity venous duplex reveal DVT in the saphenofemoral junction, common femoral, profunda, femoral, popliteal, gastrocnemius, posterior tibial, and peroneal veins with superficial thrombus in the greater saphenous vein bilaterally. Patient has a history of PE. Pharmacy has been consulted to dose heparin for VTE  treatment. He has been having some hematuria that is suspected from kidney stone. Hg 11.4, platelets 118. No recent epidural catheter or infusion per MD.   Goal of Therapy:  Heparin level 0.3-0.7 units/ml Monitor platelets by anticoagulation protocol: Yes   Plan:  - Bolus dose of heparin IV 5000 units x 1 - Start heparin IV 1700units/hr - Monitor CBC, heparin level, and s/s of bleeding - Obtain heparin level at 0100 - Follow-up plan for thrombolytics on 7/18  Reynaldo Rossman A. Lenon AhmadiBinz, PharmD Clinical Pharmacist Pager: [phone] Pharmacy: [phone] 03/16/2014 6:34 PM

## 2014-03-16 NOTE — Progress Notes (Signed)
Received call from Delice Bisonara regarding patient down in Endo. She stated that patient has been complaining of some chest pain. Patient states that he has been having it off and on all morning but when asked this morning on my assessment at 8:45 patient denied any chest pain and only had c/o pain in groin with some nausea. MD notified and will continue to monitor.

## 2014-03-16 NOTE — Progress Notes (Signed)
Bilateral lower extremity venous duplex:  Positive for DVT in the saphenofemoral junction, common femoral, profunda, femoral, popliteal, gastrocnemius, posterior tibial, and peroneal veins with superficial thrombus in the greater saphenous vein bilaterally.

## 2014-03-16 NOTE — Evaluation (Signed)
Physical Therapy Evaluation and Discharge Patient Details Name: [name] MRN: [id number] DOB: [birth year] Today's Date: 03/16/2014   History of Present Illness  63 year old white male with previous anterior cervical discectomy, fusion, and plating as well as a T10-T12 anterior instrumentation and fusion. The patient has had chronic back pain but it has recently worsened. He was admitted for pain control. The patient underwent a lumbar CT because he can't have an MRI because of the spinal cord stimulator. Pt found to have  Metabolic acidosis and possible kidne stone workup underway.   Clinical Impression  Pt adm from home due to the above. Pt at supervision to mod I level for all mobility at this time. Demo increased stability and balance with use of RW. Pt is agreeable to ambulate with RW at this time secondary to pain.  Pt encouraged to continue mobilization around unit with nursing as tolerated. No focal deficits or weaknesses noted to warrant further acute PT. If back pain persists, recommend OPPT as seen appropriate by MD.   Follow Up Recommendations Outpatient PT;Supervision/Assistance - 24 hour;Other (comment) (pending progress with pain; for LBP )    Equipment Recommendations  Rolling walker with 5" wheels;Other (comment) (pending progress)    Recommendations for Other Services       Precautions / Restrictions Precautions Precautions: None Restrictions Weight Bearing Restrictions: No      Mobility  Bed Mobility Overal bed mobility: Independent                Transfers Overall transfer level: Modified independent Equipment used: Rolling walker (2 wheeled)             General transfer comment: incr time due to pain; cues for hand placement and safety with RW   Ambulation/Gait Ambulation/Gait assistance: Supervision Ambulation Distance (Feet): 80 Feet Assistive device: Rolling walker (2 wheeled) Gait Pattern/deviations: Step-through pattern;Wide base of  support;Trunk flexed Gait velocity: decr due to pain Gait velocity interpretation: Below normal speed for age/gender General Gait Details: pt limited due to pain; no LOB noted with directional  changes; demo increased stability with RW; pt reports he does not want to use RW when he goes home but will if the pain is not under control; supervision for safety  Stairs            Wheelchair Mobility    Modified Rankin (Stroke Patients Only)       Balance Overall balance assessment: Modified Independent                                           Pertinent Vitals/Pain 10/10; patient repositioned for comfort in chair     Home Living Family/patient expects to be discharged to:: Private residence Living Arrangements: Spouse/significant other Available Help at Discharge: Family;Available 24 hours/day Type of Home: House Home Access: Level entry     Home Layout: One level Home Equipment: Crutches Additional Comments: had crutch from previous surgery; only uses with incr pain     Prior Function Level of Independence: Independent               Hand Dominance        Extremity/Trunk Assessment   Upper Extremity Assessment: Defer to OT evaluation           Lower Extremity Assessment: Overall WFL for tasks assessed (c/o numbness in anterior bil thighs )  Cervical / Trunk Assessment: Normal  Communication   Communication: No difficulties  Cognition Arousal/Alertness: Awake/alert Behavior During Therapy: WFL for tasks assessed/performed Overall Cognitive Status: Within Functional Limits for tasks assessed                      General Comments General comments (skin integrity, edema, etc.): bil LEs edematous; denies any changes in sensation below knees     Exercises        Assessment/Plan    PT Assessment Patent does not need any further PT services;All further PT needs can be met in the next venue of care  PT Diagnosis  Acute pain   PT Problem List Pain;Decreased activity tolerance;Decreased mobility  PT Treatment Interventions     PT Goals (Current goals can be found in the Care Plan section) Acute Rehab PT Goals Patient Stated Goal: to go home when the pain is controlled PT Goal Formulation: No goals set, d/c therapy    Frequency     Barriers to discharge        Co-evaluation               End of Session Equipment Utilized During Treatment: Gait belt Activity Tolerance: Patient limited by pain Patient left: in chair;with call bell/phone within reach Nurse Communication: Mobility status         Time: 1421-1436 PT Time Calculation (min): 15 min   Charges:   PT Evaluation $Initial PT Evaluation Tier I: 1 Procedure PT Treatments $Gait Training: 8-22 mins   PT G CodesGustavus Bryant, [place] 03/16/2014, 3:03 PM

## 2014-03-17 ENCOUNTER — Inpatient Hospital Stay (HOSPITAL_COMMUNITY): Payer: Medicare Other

## 2014-03-17 DIAGNOSIS — I82493 Acute embolism and thrombosis of other specified deep vein of lower extremity, bilateral: Secondary | ICD-10-CM | POA: Diagnosis present

## 2014-03-17 DIAGNOSIS — R1115 Cyclical vomiting syndrome unrelated to migraine: Secondary | ICD-10-CM

## 2014-03-17 DIAGNOSIS — I82409 Acute embolism and thrombosis of unspecified deep veins of unspecified lower extremity: Secondary | ICD-10-CM

## 2014-03-17 LAB — BASIC METABOLIC PANEL
Anion gap: 16 — ABNORMAL HIGH (ref 5–15)
BUN: 45 mg/dL — ABNORMAL HIGH (ref 6–23)
CALCIUM: 9.6 mg/dL (ref 8.4–10.5)
CHLORIDE: 90 meq/L — AB (ref 96–112)
CO2: 23 mEq/L (ref 19–32)
Creatinine, Ser: 1.2 mg/dL (ref 0.50–1.35)
GFR calc Af Amer: 73 mL/min — ABNORMAL LOW (ref >90)
GFR calc non Af Amer: 63 mL/min — ABNORMAL LOW (ref >90)
Glucose, Bld: 148 mg/dL — ABNORMAL HIGH (ref 70–99)
Potassium: 5 mEq/L (ref 3.7–5.3)
SODIUM: 129 meq/L — AB (ref 137–147)

## 2014-03-17 LAB — CBC
HCT: 30.5 % — ABNORMAL LOW (ref 39.0–52.0)
HEMATOCRIT: 35.2 % — AB (ref 39.0–52.0)
Hemoglobin: 11.3 g/dL — ABNORMAL LOW (ref 13.0–17.0)
Hemoglobin: 10.2 g/dL — ABNORMAL LOW (ref 13.0–17.0)
MCH: 29.2 pg (ref 26.0–34.0)
MCH: 30.2 pg (ref 26.0–34.0)
MCHC: 32.1 g/dL (ref 30.0–36.0)
MCHC: 33.4 g/dL (ref 30.0–36.0)
MCV: 91 fL (ref 78.0–100.0)
MCV: 90.2 fL (ref 78.0–100.0)
Platelets: 95 10*3/uL — ABNORMAL LOW (ref 150–400)
Platelets: 80 10*3/uL — ABNORMAL LOW (ref 150–400)
RBC: 3.87 MIL/uL — ABNORMAL LOW (ref 4.22–5.81)
RBC: 3.38 MIL/uL — ABNORMAL LOW (ref 4.22–5.81)
RDW: 15.1 % (ref 11.5–15.5)
RDW: 15.2 % (ref 11.5–15.5)
WBC: 19.4 10*3/uL — AB (ref 4.0–10.5)
WBC: 19.4 10*3/uL — ABNORMAL HIGH (ref 4.0–10.5)

## 2014-03-17 LAB — HEPARIN LEVEL (UNFRACTIONATED)
Heparin Unfractionated: 0.86 IU/mL — ABNORMAL HIGH (ref 0.30–0.70)
Heparin Unfractionated: 0.95 IU/mL — ABNORMAL HIGH (ref 0.30–0.70)
Heparin Unfractionated: 0.86 IU/mL — ABNORMAL HIGH (ref 0.30–0.70)
Heparin Unfractionated: 0.24 IU/mL — ABNORMAL LOW (ref 0.30–0.70)

## 2014-03-17 LAB — FIBRINOGEN
Fibrinogen: 173 mg/dL — ABNORMAL LOW (ref 204–475)
Fibrinogen: 120 mg/dL — ABNORMAL LOW (ref 204–475)

## 2014-03-17 LAB — MRSA PCR SCREENING: MRSA by PCR: NEGATIVE

## 2014-03-17 LAB — PROTIME-INR
INR: 1.27 (ref 0.00–1.49)
Prothrombin Time: 15.9 seconds — ABNORMAL HIGH (ref 11.6–15.2)

## 2014-03-17 LAB — PRO B NATRIURETIC PEPTIDE: Pro B Natriuretic peptide (BNP): 98.8 pg/mL (ref 0–125)

## 2014-03-17 MED ORDER — SODIUM CHLORIDE 0.9 % IV SOLN: 250.0000 mL | INTRAVENOUS | Status: DC | PRN

## 2014-03-17 MED ORDER — TENECTEPLASE 50 MG IV KIT
0.2500 mg/h | PACK | INTRAVENOUS | Status: DC
  Administered 2014-03-18: 0.25 mg/h
  Filled 2014-03-17 (×2): qty 1

## 2014-03-17 MED ORDER — IOHEXOL 300 MG/ML  SOLN
100.0000 mL | Freq: Once | INTRAMUSCULAR | Status: AC | PRN
  Administered 2014-03-17: 100 mL via INTRAVENOUS

## 2014-03-17 MED ORDER — SODIUM CHLORIDE 0.9 % IJ SOLN: 3.0000 mL | INTRAMUSCULAR | Status: DC | PRN

## 2014-03-17 MED ORDER — HYDROMORPHONE HCL PF 1 MG/ML IJ SOLN
INTRAMUSCULAR | Status: AC
  Filled 2014-03-17: qty 4

## 2014-03-17 MED ORDER — HEPARIN (PORCINE) IN NACL 100-0.45 UNIT/ML-% IJ SOLN
800.0000 [IU]/h | INTRAMUSCULAR | Status: DC
  Filled 2014-03-17: qty 250

## 2014-03-17 MED ORDER — ONDANSETRON HCL 4 MG/2ML IJ SOLN
4.0000 mg | Freq: Four times a day (QID) | INTRAMUSCULAR | Status: DC | PRN
  Administered 2014-03-18: 4 mg via INTRAVENOUS

## 2014-03-17 MED ORDER — SODIUM CHLORIDE 0.9 % IJ SOLN
3.0000 mL | Freq: Two times a day (BID) | INTRAMUSCULAR | Status: DC
  Administered 2014-03-17 – 2014-03-20 (×7): 3 mL via INTRAVENOUS

## 2014-03-17 MED ORDER — SODIUM CHLORIDE 0.9 % IV SOLN
Freq: Once | INTRAVENOUS | Status: AC
  Administered 2014-03-17: 15:00:00 via INTRAVENOUS
  Filled 2014-03-17: qty 250

## 2014-03-17 MED ORDER — TENECTEPLASE 50 MG IV KIT
0.2500 mg/h | PACK | INTRAVENOUS | Status: DC
  Administered 2014-03-18: 0.25 mg/h
  Filled 2014-03-17 (×3): qty 1

## 2014-03-17 MED ORDER — HYDROMORPHONE HCL PF 1 MG/ML IJ SOLN
2.0000 mg | INTRAMUSCULAR | Status: DC | PRN
  Administered 2014-03-17 – 2014-03-18 (×6): 2 mg via INTRAVENOUS
  Filled 2014-03-17 (×5): qty 2

## 2014-03-17 MED ORDER — MIDAZOLAM HCL 2 MG/2ML IJ SOLN
INTRAMUSCULAR | Status: AC | PRN
  Administered 2014-03-17 (×4): 0.5 mg via INTRAVENOUS
  Administered 2014-03-17: 1 mg via INTRAVENOUS
  Administered 2014-03-17: 0.5 mg via INTRAVENOUS

## 2014-03-17 MED ORDER — FENTANYL CITRATE 0.05 MG/ML IJ SOLN
INTRAMUSCULAR | Status: AC
Start: 2014-03-17 — End: 2014-03-17
  Filled 2014-03-17: qty 6

## 2014-03-17 MED ORDER — MIDAZOLAM HCL 2 MG/2ML IJ SOLN
INTRAMUSCULAR | Status: AC
  Filled 2014-03-17: qty 6

## 2014-03-17 MED ORDER — IOHEXOL 300 MG/ML  SOLN
100.0000 mL | Freq: Once | INTRAMUSCULAR | Status: AC | PRN
  Administered 2014-03-17: 50 mL via INTRAVENOUS

## 2014-03-17 MED ORDER — BIOTENE DRY MOUTH MT LIQD
15.0000 mL | Freq: Two times a day (BID) | OROMUCOSAL | Status: DC
  Administered 2014-03-18 – 2014-03-19 (×4): 15 mL via OROMUCOSAL

## 2014-03-17 MED ORDER — SODIUM CHLORIDE 0.9 % IV SOLN
INTRAVENOUS | Status: AC | PRN
  Administered 2014-03-17: 500 mL/h via INTRAVENOUS

## 2014-03-17 MED ORDER — FENTANYL CITRATE 0.05 MG/ML IJ SOLN
INTRAMUSCULAR | Status: AC | PRN
  Administered 2014-03-17: 12.5 ug via INTRAVENOUS
  Administered 2014-03-17 (×2): 25 ug via INTRAVENOUS
  Administered 2014-03-17: 12.5 ug via INTRAVENOUS

## 2014-03-17 NOTE — Sedation Documentation (Addendum)
Pt stated he couldn't do it and wanted to move.  Instructed to lie still during procedure. VSS. meds given per MD.

## 2014-03-17 NOTE — Progress Notes (Signed)
ANTICOAGULATION CONSULT NOTE - Follow Up Consult  Pharmacy Consult for heparin Indication: DVT  Labs:  Recent Labs  03/15/14 0137 03/16/14 0540 03/16/14 1355 03/16/14 2017 03/17/14 0048  HGB 12.2* 11.4*  --  11.2* 11.3*  HCT 38.0* 35.1*  --  34.7* 35.2*  PLT 129* 118*  --  96* 95*  HEPARINUNFRC  --   --   --   --  0.86*  CREATININE 0.81 0.85  --   --   --   TROPONINI  --   --  <0.30  --   --     Assessment: 63yo male slightly supratherapeutic on heparin with initial dosing for DVT (CT results pending), lab was drawn a little early compared to time bolus given.  Goal of Therapy:  Heparin level 0.3-0.7 units/ml   Plan:  Will decrease heparin gtt slightly to 1600 units/hr and check level in 6hr.  Vernard GamblesVeronda Latif Nazareno, PharmD, BCPS  03/17/2014,1:34 AM

## 2014-03-17 NOTE — Progress Notes (Signed)
ANTICOAGULATION CONSULT NOTE - Follow Up Consult  Pharmacy Consult for heparin Indication: DVT  Labs:  Recent Labs  03/15/14 0137 03/16/14 0540 03/16/14 1355 03/16/14 2017  03/17/14 0048 03/17/14 0720 03/17/14 0736 03/17/14 1500 03/17/14 1612 03/17/14 2100  HGB 12.2* 11.4*  --  11.2*  --  11.3*  --   --   --  10.2*  --   HCT 38.0* 35.1*  --  34.7*  --  35.2*  --   --   --  30.5*  --   PLT 129* 118*  --  96*  --  95*  --   --   --  80*  --   LABPROT  --   --   --   --   --   --  15.9*  --   --   --   --   INR  --   --   --   --   --   --  1.27  --   --   --   --   HEPARINUNFRC  --   --   --   --   < > 0.86*  --  0.95* 0.86*  --  0.24*  CREATININE 0.81 0.85  --   --   --  1.20  --   --   --   --   --   TROPONINI  --   --  <0.30  --   --   --   --   --   --   --   --   < > = values in this interval not displayed.   Assessment/Plan:  63yo male now therapeutic on heparin after rate adjustment with low dose while on TNKase. Will continue gtt at current rate and confirm stable with am labs.   Vernard GamblesVeronda Zymeir Salminen, PharmD, BCPS  03/17/2014,10:25 PM

## 2014-03-17 NOTE — Progress Notes (Addendum)
Noted that patient has at least 3 times gone into SB with a heart rate as low as the mid 40s.  Patient is not symptomatic or displaying and signs of being in a distress.  MD made aware.  Will continue to monitor.  Laverna PeaceHINTZ, Liddie Chichester M   MD contacted again because patient is now staying with a heart rate consistently in the mid 40's.  Will try to stimulate the patient and will continue to monitor patient behavior.  MD aware.  Tommi EmeryHINTZ, Loriana Samad M

## 2014-03-17 NOTE — Procedures (Signed)
Procedure:  Bilateral lower extremity and IVC venography; transcatheter thrombolysis of both lower extremities and IVC. Access:  Both popliteal veins Findings:  Extensive DVT confirmed.  Popliteal veins are partly open.  Soft thrombus in both thighs.  Firmer thrombus in iliac veins and IVC bilaterally. IVC filter completely occluded.  IVC above filter widely patent. Power pulse spray of diluted tPA pulsed into clot bilaterally. Bilateral 90 cm infusion catheters with 50 cm infusion lengths placed extending from thighs to IVC filter bilaterally. Will infuse catheters with TNK at rate of 0.25 mg/hr overnight.  Will recheck status of veins tomorrow.

## 2014-03-17 NOTE — Progress Notes (Signed)
ANTICOAGULATION CONSULT NOTE - Follow Up Consult  Pharmacy Consult for heparin Indication: DVT  Labs:  Recent Labs  03/15/14 0137 03/16/14 0540 03/16/14 1355 03/16/14 2017 03/17/14 0048 03/17/14 0736  HGB 12.2* 11.4*  --  11.2* 11.3*  --   HCT 38.0* 35.1*  --  34.7* 35.2*  --   PLT 129* 118*  --  96* 95*  --   HEPARINUNFRC  --   --   --   --  0.86* 0.95*  CREATININE 0.81 0.85  --   --  1.20  --   TROPONINI  --   --  <0.30  --   --   --     Assessment: 63yo male remains supratherapeutic on heparin with initial dosing for DVT after heparin rate decrease.  Noted plans for thrombolytic therapy by IR due to massive thrombus burden. Goal of Therapy:  Heparin level 0.3-0.7 units/ml   Plan:  Will decrease heparin gtt slightly to 1400 units/hr and check level in 6hr.   Celedonio MiyamotoJeremy Shianna Bally, PharmD, BCPS Clinical Pharmacist Pager [phone]   03/17/2014,8:50 AM

## 2014-03-17 NOTE — Sedation Documentation (Signed)
To IR room 1 for bilateral leg lysis.  Pt writhing in pain prior to coming down, received 2mg  Dilaudid as leaving floor.  Family accompanied to Rad waiting room.

## 2014-03-17 NOTE — Progress Notes (Signed)
Received report from Radiologist of CT angio of Abd/pelvis.  Paged MD.  Per M. Lynch no new orders.  Pharmacy will follow with heparin drip.  Will continue to monitor pt.

## 2014-03-17 NOTE — Progress Notes (Signed)
ANTICOAGULATION CONSULT NOTE - Follow Up Consult  Pharmacy Consult for heparin Indication: DVT  Labs:  Recent Labs  03/15/14 0137 03/16/14 0540 03/16/14 1355 03/16/14 2017 03/17/14 0048 03/17/14 0720 03/17/14 0736 03/17/14 1612  HGB 12.2* 11.4*  --  11.2* 11.3*  --   --  10.2*  HCT 38.0* 35.1*  --  34.7* 35.2*  --   --  30.5*  PLT 129* 118*  --  96* 95*  --   --  PENDING  LABPROT  --   --   --   --   --  15.9*  --   --   INR  --   --   --   --   --  1.27  --   --   HEPARINUNFRC  --   --   --   --  0.86*  --  0.95*  --   CREATININE 0.81 0.85  --   --  1.20  --   --   --   TROPONINI  --   --  <0.30  --   --   --   --   --     Assessment: 63yo male remains supratherapeutic on heparin infusion of 1400 units/hr. TPA used in IR and TNK being infused afterwards. RN reports that urine has a red tinge. H/H relatively stable. Plt pending. Instructed RN to monitor and call back if not resolved.   Goal of Therapy:  Heparin level 0.2-0.5 units/ml (Per Dr. Fredia SorrowYamagata)   Plan:  Will decrease heparin gtt to 1150 units/hr and check level in 6hr. Monitor daily HL, CBC and s/s of bleeding   [name], PharmD.  Clinical Pharmacist Pager [phone]

## 2014-03-17 NOTE — Sedation Documentation (Signed)
O2 sat 85% on RA, O2 started 3L/[place]

## 2014-03-17 NOTE — Sedation Documentation (Signed)
Pt moaning softly in pain. VSS

## 2014-03-17 NOTE — Consult Note (Signed)
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  Referring Physician: Nixon Kolton Kochanski is an 63 y.o. male.  HPI: Admitted with severe groin pain bilaterally.  Began to notice symptoms about 3-4 weeks ago with more difficulty getting out of bed and bearing weight, worse in right > left leg.  Symptoms acutely worsened earlier this week on Wed when he began experiencing severe bilateral groin pain, leg pain and leg edema.  Patient has history of pulmonary embolism in 2007 following lumbar surgery complicated by lung injury.  At that time, a permanent IVC filter was also placed for VTE protection.  Was anticoagulated briefly but not on any chronic anticoagulants.  Past Medical History  Diagnosis Date  . Arthritis   . CAD (coronary artery disease)     s/p prior PCI  . Hypertension   . Chronic diastolic CHF (congestive heart failure)   . Pulmonary emboli   . Complication of anesthesia   . PONV (postoperative nausea and vomiting)   . Shortness of breath     Past Surgical History  Procedure Laterality Date  . Carpal tunnel release      Bilateral  . Rotator cuff repair    . Total knee arthroplasty      Bilateral  . Foot surgery      Right  . Ankle surgery      Fusion  . Back surgery      x3  . Pacemaker insertion      [place] filter  . Thoracotomy      For access to a [id number] fusion complicated by hemothorax and 44 day hospitalization  . Coronary angioplasty with stent placement      Stenting of the RCA as well as other stenting procedures in the past    Family History  Problem Relation Age of Onset  . Heart attack Mother   . Coronary artery disease Mother   . Lung disease Father     Social History:  reports that he quit smoking about 43 years ago. He has never used smokeless tobacco. He reports that he does not drink alcohol or use illicit drugs.  Allergies: No Known Allergies  Medications: I have reviewed the patient's current  medications.  Results for orders placed during the hospital encounter of 03/15/14 (from the past 48 hour(s))  URINE CULTURE     Status: None   Collection Time    03/15/14 10:52 AM      Result Value Ref Range   Specimen Description URINE, CLEAN CATCH     Special Requests NONE     Culture  Setup Time       Value: 03/15/2014 18:26     Performed at [place]       Value: NO GROWTH     Performed at [facility]   Culture       Value: NO GROWTH     Performed at [facility]   Report Status 03/16/2014 FINAL    BASIC METABOLIC PANEL     Status: Abnormal   Collection Time    03/16/14  5:40 AM      Result Value Ref Range   Sodium 134 (*) 137 - 147 mEq/L   Potassium 4.7  3.7 - 5.3 mEq/L   Chloride 93 (*) 96 - 112 mEq/L   CO2 24  19 - 32 mEq/L   Glucose, Bld 154 (*) 70 - 99 mg/dL   BUN 25 (*)  6 - 23 mg/dL   Creatinine, Ser 0.85  0.50 - 1.35 mg/dL   Calcium 9.7  8.4 - 10.5 mg/dL   GFR calc non Af Amer >90  >90 mL/min   GFR calc Af Amer >90  >90 mL/min   Comment: (NOTE)     The eGFR has been calculated using the CKD EPI equation.     This calculation has not been validated in all clinical situations.     eGFR's persistently <90 mL/min signify possible Chronic Kidney     Disease.   Anion gap 17 (*) 5 - 15  CBC     Status: Abnormal   Collection Time    03/16/14  5:40 AM      Result Value Ref Range   WBC 16.4 (*) 4.0 - 10.5 K/uL   RBC 3.92 (*) 4.22 - 5.81 MIL/uL   Hemoglobin 11.4 (*) 13.0 - 17.0 g/dL   HCT 35.1 (*) 39.0 - 52.0 %   MCV 89.5  78.0 - 100.0 fL   MCH 29.1  26.0 - 34.0 pg   MCHC 32.5  30.0 - 36.0 g/dL   RDW 15.0  11.5 - 15.5 %   Platelets 118 (*) 150 - 400 K/uL   Comment: REPEATED TO VERIFY     PLATELET COUNT CONFIRMED BY SMEAR  LACTIC ACID, PLASMA     Status: None   Collection Time    03/16/14  1:55 PM      Result Value Ref Range   Lactic Acid, Venous 1.5  0.5 - 2.2 mmol/L  SALICYLATE LEVEL     Status: Abnormal    Collection Time    03/16/14  1:55 PM      Result Value Ref Range   Salicylate Lvl <9.2 (*) 2.8 - 20.0 mg/dL  TROPONIN I     Status: None   Collection Time    03/16/14  1:55 PM      Result Value Ref Range   Troponin I <0.30  <0.30 ng/mL   Comment:            Due to the release kinetics of cTnI,     a negative result within the first hours     of the onset of symptoms does not rule out     myocardial infarction with certainty.     If myocardial infarction is still suspected,     repeat the test at appropriate intervals.  CBC     Status: Abnormal   Collection Time    03/16/14  8:17 PM      Result Value Ref Range   WBC 19.5 (*) 4.0 - 10.5 K/uL   RBC 3.80 (*) 4.22 - 5.81 MIL/uL   Hemoglobin 11.2 (*) 13.0 - 17.0 g/dL   HCT 34.7 (*) 39.0 - 52.0 %   MCV 91.3  78.0 - 100.0 fL   MCH 29.5  26.0 - 34.0 pg   MCHC 32.3  30.0 - 36.0 g/dL   RDW 15.0  11.5 - 15.5 %   Platelets 96 (*) 150 - 400 K/uL   Comment: CONSISTENT WITH PREVIOUS RESULT  BASIC METABOLIC PANEL     Status: Abnormal   Collection Time    03/17/14 12:48 AM      Result Value Ref Range   Sodium 129 (*) 137 - 147 mEq/L   Potassium 5.0  3.7 - 5.3 mEq/L   Chloride 90 (*) 96 - 112 mEq/L   CO2 23  19 - 32 mEq/L  Glucose, Bld 148 (*) 70 - 99 mg/dL   BUN 45 (*) 6 - 23 mg/dL   Comment: DELTA CHECK NOTED   Creatinine, Ser 1.20  0.50 - 1.35 mg/dL   Calcium 9.6  8.4 - 10.5 mg/dL   GFR calc non Af Amer 63 (*) >90 mL/min   GFR calc Af Amer 73 (*) >90 mL/min   Comment: (NOTE)     The eGFR has been calculated using the CKD EPI equation.     This calculation has not been validated in all clinical situations.     eGFR's persistently <90 mL/min signify possible Chronic Kidney     Disease.   Anion gap 16 (*) 5 - 15  CBC     Status: Abnormal   Collection Time    03/17/14 12:48 AM      Result Value Ref Range   WBC 19.4 (*) 4.0 - 10.5 K/uL   RBC 3.87 (*) 4.22 - 5.81 MIL/uL   Hemoglobin 11.3 (*) 13.0 - 17.0 g/dL   HCT 35.2 (*) 39.0 -  52.0 %   MCV 91.0  78.0 - 100.0 fL   MCH 29.2  26.0 - 34.0 pg   MCHC 32.1  30.0 - 36.0 g/dL   RDW 15.1  11.5 - 15.5 %   Platelets 95 (*) 150 - 400 K/uL   Comment: CONSISTENT WITH PREVIOUS RESULT  HEPARIN LEVEL (UNFRACTIONATED)     Status: Abnormal   Collection Time    03/17/14 12:48 AM      Result Value Ref Range   Heparin Unfractionated 0.86 (*) 0.30 - 0.70 IU/mL   Comment:            IF HEPARIN RESULTS ARE BELOW     EXPECTED VALUES, AND PATIENT     DOSAGE HAS BEEN CONFIRMED,     SUGGEST FOLLOW UP TESTING     OF ANTITHROMBIN III LEVELS.  HEPARIN LEVEL (UNFRACTIONATED)     Status: Abnormal   Collection Time    03/17/14  7:36 AM      Result Value Ref Range   Heparin Unfractionated 0.95 (*) 0.30 - 0.70 IU/mL   Comment:            IF HEPARIN RESULTS ARE BELOW     EXPECTED VALUES, AND PATIENT     DOSAGE HAS BEEN CONFIRMED,     SUGGEST FOLLOW UP TESTING     OF ANTITHROMBIN III LEVELS.    Ct Abdomen Pelvis Wo Contrast  03/16/2014   CLINICAL DATA:  Back pain, hematuria  EXAM: CT ABDOMEN AND PELVIS WITHOUT CONTRAST  TECHNIQUE: Multidetector CT imaging of the abdomen and pelvis was performed following the standard protocol without IV contrast.  COMPARISON:  03/10/2010, 02/16/2008.  Dedicated spine CT 03/15/2014  FINDINGS: Patchy subpleural atelectasis is noted. Streak artifact from spinal fusion hardware is noted with spinal stimulator in place. No pleural effusion.  Streak artifact from presumed RCA stent partly visualized. Depending gallstone noted within otherwise normal-appearing gallbladder. Unenhanced liver, adrenal glands, spleen, left kidney, and pancreas are unremarkable. 1.7 cm fluid density right upper renal pole cortical cyst incidentally noted. IVC filter in place with expansion of the bilateral iliac veins and sub-filter IVC to the level of the common femoral veins bilaterally, with change in vessel density seen best on coronal image 38. Compared to the prior similar exams 2011  and 2009, this finding is new.  Appendix is normal. No bowel wall thickening or focal segmental dilatation is identified. Bladder is normal.  No ascites, free air, or lymphadenopathy. Lumbar fusion hardware is reidentified. Above-described hardware has been evaluated at dedicated imaging 03/15/2014 dictated separately. Remote left posterior tenth rib fracture reidentified. Allowing for differences in technique, no significant change in L1 and L2 compression deformities.  Anterior abdominal wall subcutaneous stranding is compatible with injection sites.  IMPRESSION: IVC expansion inferiorly to the bilateral common femoral veins, highly suspicious for deep venous thrombosis. Bilateral lower extremity Doppler venous ultrasound is recommended for further evaluation. Consider consultation with interventional radiology if thrombectomy is considered. Critical Value/emergent results were called by telephone at the time of interpretation on 03/16/2014 at 5:01 pm to Baxter Flattery, [place], who verbally acknowledged these results.  No acute intra-abdominal or pelvic pathology otherwise.   Electronically Signed   By: Conchita Paris M.D.   On: 03/16/2014 17:02   Dg Hip Bilateral W/pelvis  03/15/2014   CLINICAL DATA:  Hip pain  EXAM: BILATERAL HIP WITH PELVIS - 4+ VIEW  COMPARISON:  CT abdomen pelvis - 03/10/2010  FINDINGS: No fracture or dislocation. Bilateral hip joint spaces are preserved. No evidence of avascular necrosis. There is minimal enthesopathic change of the bilateral lesser trochanters.  Limited visualization of the bilateral SI joints and pubic symphysis is normal.  Post lower lumbar paraspinal fusion, incompletely evaluated. A presumed neural spinal stimulator device overlies the left mid abdomen, incompletely evaluated. Post IVC filter placement.  IMPRESSION: No fracture or dislocation.   Electronically Signed   By: Sandi Mariscal M.D.   On: 03/15/2014 20:25   Ct Angio Abd/pel W/ And/or W/o  03/17/2014   CLINICAL DATA:   Known deep venous thrombosis within both lower extremities. Assess for extension of thrombus to the abdomen or pelvis.  EXAM: CT VENOGRAM ABDOMEN AND PELVIS WITHOUT AND WITH CONTRAST  TECHNIQUE: Multidetector CT imaging of the abdomen and pelvis was performed using the standard protocol during bolus administration of intravenous contrast. Multiplanar reconstructed images and MIPs were obtained and reviewed to evaluate the venous vascular anatomy.  CONTRAST:  176m OMNIPAQUE IOHEXOL 300 MG/ML  SOLN  COMPARISON:  None.  FINDINGS: On coronal images, there appears to be uniformly decreased attenuation extending within the infrarenal IVC, across the patient's IVC filter. It ends 4 cm below the level of the renal arteries. This attenuation is grossly stable across multiple sets of images, raising suspicion for diffuse acute thrombus. There is associated distention of the infrarenal IVC, common iliac veins, and external iliac veins. The patient's IVC filter is noted inferior to the renal veins, somewhat lower than typically seen.  The portal venous system is unremarkable in appearance. The abdominal aorta demonstrates mild scattered calcification, but is otherwise grossly unremarkable. The visualized branches of the abdominal aorta are patent, including the celiac trunk, superior mesenteric artery, bilateral renal arteries and inferior mesenteric artery.  Mild bibasilar atelectasis is noted. Diffuse coronary artery calcifications are seen.  The liver is unremarkable in appearance. A few scattered calcified granulomata are seen within the spleen. The gallbladder is within normal limits. The pancreas and adrenal glands are unremarkable.  Scattered small bilateral renal cysts are seen. The kidneys are otherwise unremarkable. There is no evidence of hydronephrosis. No renal or ureteral stones are seen. No perinephric stranding is appreciated.  No free fluid is identified. The small bowel is unremarkable in appearance. The  stomach is within normal limits. No acute vascular abnormalities are seen.  The appendix is normal in caliber, without evidence for appendicitis. A few tiny diverticula are noted along the visualized colon; the  colon is otherwise unremarkable.  The bladder is mildly distended and grossly unremarkable. The prostate remains normal in size, with scattered calcification.  Prominent anterior pelvic sidewall nodes are seen bilaterally, measuring up to 1.8 cm on the left and 1.3 cm on the right. These are of uncertain significance, but may reflect the acute thrombosis of the adjacent venous vasculature. No inguinal lymphadenopathy is seen.  No acute osseous abnormalities are identified. The patient is status post interbody fusion at T10-T12, and there is mild chronic loss of height at L1 and L2. There is partial osseous fusion at L5-S1, with associated hardware. Spinal stimulation leads and an associated metallic device are noted within the soft tissues of the back.  Review of the MIP images confirms the above findings.  IMPRESSION: 1. Suspect acute thrombosis filling the external iliac veins and common iliac veins bilaterally, and along the infrarenal IVC. This appears to extend across the IVC filter, though it ends approximately 4 cm below the level of the renal arteries. Associated distention of the venous vasculature, without definite evidence of associated thrombophlebitis. 2. Prominent anterior pelvic sidewall nodes noted bilaterally, measuring up to 1.8 cm on the left and 1.3 cm on the right. These are of uncertain significance, but may reflect the acute thrombosis of the adjacent venous vasculature. 3. Diffuse coronary artery calcifications seen. 4. Mild scattered calcification along the abdominal aorta. 5. Mild bibasilar atelectasis noted. 6. Postoperative changes along the lower thoracic and lumbar spine, with mild chronic loss of height at L1 and L2.  These results were called by telephone at the time of  interpretation on 03/17/2014 at 1:37 am to Nursing on [facility], who verbally acknowledged these results.   Electronically Signed   By: Garald Balding M.D.   On: 03/17/2014 01:37    Review of Systems  Constitutional: Negative for fever, chills, weight loss, malaise/fatigue and diaphoresis.  HENT: Negative for congestion and nosebleeds.   Respiratory: Negative for cough, hemoptysis, sputum production, shortness of breath and wheezing.   Cardiovascular: Positive for leg swelling. Negative for chest pain, palpitations, orthopnea and PND.  Gastrointestinal: Positive for nausea and abdominal pain. Negative for heartburn, vomiting, diarrhea, constipation, blood in stool and melena.  Genitourinary: Positive for urgency. Negative for dysuria, frequency, hematuria and flank pain.  Musculoskeletal: Positive for back pain and joint pain.  Skin: Negative for itching and rash.  Neurological: Positive for weakness and headaches. Negative for dizziness, focal weakness, seizures and loss of consciousness.  Endo/Heme/Allergies: Does not bruise/bleed easily.   Blood pressure 100/57, pulse 54, temperature 98.6 F (37 C), temperature source Oral, resp. rate 15, height 5' 11"  (1.803 m), weight 260 lb (117.935 kg), SpO2 90.00%. Physical Exam  Constitutional: He is oriented to person, place, and time. He appears well-developed and well-nourished. He appears distressed.  HENT:  Head: Normocephalic.  Mouth/Throat: Oropharynx is clear and moist.  Neck: Neck supple. No JVD present.  Cardiovascular: Normal rate, regular rhythm, normal heart sounds and intact distal pulses.  Exam reveals no gallop and no friction rub.   No murmur heard. Respiratory: Breath sounds normal. No respiratory distress. He has no wheezes. He has no rales. He exhibits no tenderness.  GI: Soft. Bowel sounds are normal. He exhibits no distension and no mass. There is tenderness. There is no rebound and no guarding.  Musculoskeletal: He exhibits  edema and tenderness.  Neurological: He is alert and oriented to person, place, and time.  Skin: Skin is warm and dry. No rash noted. He is  not diaphoretic. No erythema.  Psychiatric: He has a normal mood and affect. His behavior is normal.    Assessment/Plan:  Duplex ultrasound shows extensive occlusive DVT in both lower extremities.  Contrast enhanced CT last night confirms IVC thrombosis at level of filter and below filter.  Bilateral iliac veins with occlusive thrombus.    Patient in severe pain when examined, especially in both groin regions.  With massive thrombus burden, recovery would be rather protracted and post-phlebitic morbidity quite high with only anticoagulation.  The patient would benefit from more aggressive treatment of clot with bilateral catheter directed thrombolytic therapy via bilateral popliteal vein access.  Risks and benefits of procedure discussed with patient and his wife.  They consent to proceed with thrombolytic therapy later today.  Will leave NPO and on IV heparin.  Will arrange for ICU bed while receiving thrombolytic therapy.  Check PT/INR and fibrinogen now.  Gerald Honea T 03/17/2014, 8:34 AM

## 2014-03-17 NOTE — Progress Notes (Signed)
[facility] PROGRESS NOTE  Piedad ClimesDavid W Stepanian ZOX:[id number] DOB: 01/01/1951 DOA: 03/15/2014 PCP: Desmond DikeAMERON,JOHN, MD  Past 24 hours:  In patient's workup for groin pain and suspected kidney stone, it was discovered and confirmed was that he had extensive bilateral DVT throughout his lower extremity venous system. Started on heparin and interventional radiology to patient for thrombolysis this morning.  Assessment/Plan: Principal Problem: Bilateral acute lower extremity DVT: Quite extensive. Appreciate interventional radiology assistance. Monitor patient closely status post thrombolysis. Cause is unclear. Patient had a previous DVT status post IVC filter a number of years ago following back surgery with complications and prolonged immobilization. His only other family member who had a blood clot was a father who suffered 1 after a leg injury. Patient himself despite his back pain, can never sit still because of pain. He therefore is quite ambulatory and once a week is on a motorcycle for 6 hours, but stops at least every 2 hours. Will discuss with hematology.  Acute renal failure: Likely from dehydration and poor by mouth intake. Will gently hydrate  Leukocytosis: Likely secondary to clot burden.  Thrombocytopenia: Patient started on heparin last night and platelets down to mid 90s from 118, was at 129 on admission except previous low platelets    Metabolic acidosis: Continues to improve. Workup negative. Patient states because of pain and nausea he has not eaten anything in over a week. Likely starvation ketosis  Active Problems:   Essential hypertension, benign: Pressure stable.   Obesity: Patient is criteria with BMI of 36    CAD, NATIVE VESSEL, status post stent placement.    HYPERLIPIDEMIA-MIXED    Diastolic CHF, chronic: Patient states no further problems with congestive heart failure. Echocardiogram in 07 negative. No signs of acute heart failure. Check BNP    Intractable low back  pain: Nothing acute on CT or hip films. Discuss with neurosurgery and they feel process is not related to his chronic back pain. Currently patient complains of groin pain.    Microscopic hematuria: Unclear etiology. Recheck before discharge    Nausea with vomiting: Suspect secondary to pain    Severe right groin pain: Secondary to clot.    Acute epigastric pain: Mistakenly refer to his chest pain. Suspect to be more indigestion from nausea and vomiting persistent. When necessary Maalox. Troponin and EKG are normal  Code Status: Full Family Communication: Spoke multiple family members at the bedside. Disposition Plan: Continue inpatient. Need to look at cause plus long-term anticoagulation plan   Consultants:  Neurosurgery  Interventional radiology  Procedures: Status post thrombolysis done 7/18  Antibiotics:  None  HPI/Subjective: Patient seen before thrombolysis. Continues to have severe 10/10 groin pain.   Objective: Filed Vitals:   03/17/14 1310  BP: 129/72  Pulse: 61  Temp:   Resp: 18    Intake/Output Summary (Last 24 hours) at 03/17/14 1340 Last data filed at 03/17/14 0300  Gross per 24 hour  Intake 1588.33 ml  Output    350 ml  Net 1238.33 ml   Filed Weights   03/14/14 2234  Weight: 117.935 kg (260 lb)    Exam:   General:  Significant distress secondary to pain, oriented x3  Cardiovascular: Regular rate and rhythm, S1-S2, borderline tachycardia  Respiratory: Clear to auscultation bilaterally  Abdomen: Soft, nontender, nondistended, positive bowel sounds  Musculoskeletal: No clubbing or cyanosis or edema  Data Reviewed: Basic Metabolic Panel:  Recent Labs Lab 03/15/14 0137 03/16/14 0540 03/17/14 0048  NA 133* 134* 129*  K 4.1  4.7 5.0  CL 92* 93* 90*  CO2 21 24 23   GLUCOSE 146* 154* 148*  BUN 21 25* 45*  CREATININE 0.81 0.85 1.20  CALCIUM 9.5 9.7 9.6   Liver Function Tests:  Recent Labs Lab 03/15/14 0137  AST 24  ALT 19   ALKPHOS 71  BILITOT 1.1  PROT 8.3  ALBUMIN 4.4   No results found for this basename: LIPASE, AMYLASE,  in the last 168 hours No results found for this basename: AMMONIA,  in the last 168 hours CBC:  Recent Labs Lab 03/15/14 0137 03/16/14 0540 03/16/14 2017 03/17/14 0048  WBC 12.6* 16.4* 19.5* 19.4*  NEUTROABS 10.4*  --   --   --   HGB 12.2* 11.4* 11.2* 11.3*  HCT 38.0* 35.1* 34.7* 35.2*  MCV 90.7 89.5 91.3 91.0  PLT 129* 118* 96* 95*   Cardiac Enzymes:  Recent Labs Lab 03/16/14 1355  TROPONINI <0.30      Scheduled Meds: . carvedilol  12.5 mg Oral BID  . dexamethasone  4 mg Intravenous 3 times per day  . fentaNYL  75 mcg Transdermal Q72H  . fentaNYL      . hydrochlorothiazide  25 mg Oral Daily  . isosorbide mononitrate  30 mg Oral Daily  . lisinopril  40 mg Oral Daily  . midazolam      . polyethylene glycol  17 g Oral Daily  . potassium chloride  10 mEq Oral BID  . senna-docusate  2 tablet Oral QHS  . simvastatin  40 mg Oral q1800  . sodium chloride 0.9 % 250 mL with alteplase (CATHFLO ACTIVASE) 12 mg infusion   Intravenous Once  . sodium chloride  3 mL Intravenous Q12H  . sodium chloride  3 mL Intravenous Q12H   Continuous Infusions: . sodium chloride 100 mL/hr at 03/16/14 1219  . sodium chloride 500 mL/hr (03/17/14 1153)  . heparin 1,400 Units/hr (03/17/14 0911)  . heparin    . tenecteplase (TNKase) infusion    . tenecteplase (TNKase) infusion       Time spent: 25 minutes    Hollice Espy  [facility] Pager [phone]. If 7PM-7AM, please contact night-coverage at [email or web address], password [facility] 03/17/2014, 1:40 PM  LOS: 2 days

## 2014-03-18 ENCOUNTER — Inpatient Hospital Stay (HOSPITAL_COMMUNITY): Payer: Medicare Other

## 2014-03-18 DIAGNOSIS — M545 Low back pain, unspecified: Secondary | ICD-10-CM

## 2014-03-18 DIAGNOSIS — I498 Other specified cardiac arrhythmias: Secondary | ICD-10-CM

## 2014-03-18 DIAGNOSIS — R001 Bradycardia, unspecified: Secondary | ICD-10-CM | POA: Diagnosis not present

## 2014-03-18 DIAGNOSIS — I5032 Chronic diastolic (congestive) heart failure: Secondary | ICD-10-CM

## 2014-03-18 DIAGNOSIS — I509 Heart failure, unspecified: Secondary | ICD-10-CM

## 2014-03-18 LAB — CBC
HCT: 31.2 % — ABNORMAL LOW (ref 39.0–52.0)
HCT: 30.3 % — ABNORMAL LOW (ref 39.0–52.0)
HCT: 29 % — ABNORMAL LOW (ref 39.0–52.0)
HCT: 30 % — ABNORMAL LOW (ref 39.0–52.0)
HEMATOCRIT: 29.5 % — AB (ref 39.0–52.0)
HEMOGLOBIN: 9.7 g/dL — AB (ref 13.0–17.0)
HEMOGLOBIN: 9.8 g/dL — AB (ref 13.0–17.0)
Hemoglobin: 10.2 g/dL — ABNORMAL LOW (ref 13.0–17.0)
Hemoglobin: 9.8 g/dL — ABNORMAL LOW (ref 13.0–17.0)
Hemoglobin: 9.6 g/dL — ABNORMAL LOW (ref 13.0–17.0)
MCH: 29.2 pg (ref 26.0–34.0)
MCH: 28.9 pg (ref 26.0–34.0)
MCH: 29.3 pg (ref 26.0–34.0)
MCH: 29.6 pg (ref 26.0–34.0)
MCH: 29.2 pg (ref 26.0–34.0)
MCHC: 32.7 g/dL (ref 30.0–36.0)
MCHC: 32.3 g/dL (ref 30.0–36.0)
MCHC: 32.9 g/dL (ref 30.0–36.0)
MCHC: 33.1 g/dL (ref 30.0–36.0)
MCHC: 32.7 g/dL (ref 30.0–36.0)
MCV: 89.4 fL (ref 78.0–100.0)
MCV: 89.4 fL (ref 78.0–100.0)
MCV: 89.1 fL (ref 78.0–100.0)
MCV: 89.5 fL (ref 78.0–100.0)
MCV: 89.3 fL (ref 78.0–100.0)
PLATELETS: 79 10*3/uL — AB (ref 150–400)
PLATELETS: 75 10*3/uL — AB (ref 150–400)
PLATELETS: 71 10*3/uL — AB (ref 150–400)
Platelets: 81 10*3/uL — ABNORMAL LOW (ref 150–400)
Platelets: 74 10*3/uL — ABNORMAL LOW (ref 150–400)
RBC: 3.31 MIL/uL — ABNORMAL LOW (ref 4.22–5.81)
RBC: 3.39 MIL/uL — ABNORMAL LOW (ref 4.22–5.81)
RBC: 3.24 MIL/uL — ABNORMAL LOW (ref 4.22–5.81)
RBC: 3.49 MIL/uL — AB (ref 4.22–5.81)
RBC: 3.36 MIL/uL — AB (ref 4.22–5.81)
RDW: 15.2 % (ref 11.5–15.5)
RDW: 15.1 % (ref 11.5–15.5)
RDW: 14.8 % (ref 11.5–15.5)
RDW: 15 % (ref 11.5–15.5)
RDW: 14.8 % (ref 11.5–15.5)
WBC: 12.9 10*3/uL — ABNORMAL HIGH (ref 4.0–10.5)
WBC: 13.1 10*3/uL — AB (ref 4.0–10.5)
WBC: 16 10*3/uL — AB (ref 4.0–10.5)
WBC: 17.3 10*3/uL — AB (ref 4.0–10.5)
WBC: 17.3 10*3/uL — ABNORMAL HIGH (ref 4.0–10.5)

## 2014-03-18 LAB — BASIC METABOLIC PANEL
ANION GAP: 16 — AB (ref 5–15)
BUN: 48 mg/dL — ABNORMAL HIGH (ref 6–23)
CALCIUM: 8.7 mg/dL (ref 8.4–10.5)
CO2: 22 meq/L (ref 19–32)
Chloride: 97 mEq/L (ref 96–112)
Creatinine, Ser: 0.97 mg/dL (ref 0.50–1.35)
GFR calc Af Amer: >90 mL/min (ref >90)
GFR calc non Af Amer: 87 mL/min — ABNORMAL LOW (ref >90)
Glucose, Bld: 139 mg/dL — ABNORMAL HIGH (ref 70–99)
Potassium: 4.9 mEq/L (ref 3.7–5.3)
SODIUM: 135 meq/L — AB (ref 137–147)

## 2014-03-18 LAB — HEPARIN LEVEL (UNFRACTIONATED)
HEPARIN UNFRACTIONATED: 0.18 [IU]/mL — AB (ref 0.30–0.70)
Heparin Unfractionated: <0.1 IU/mL — ABNORMAL LOW (ref 0.30–0.70)
Heparin Unfractionated: 0.13 IU/mL — ABNORMAL LOW (ref 0.30–0.70)

## 2014-03-18 LAB — FIBRINOGEN
FIBRINOGEN: 107 mg/dL — AB (ref 204–475)
FIBRINOGEN: 88 mg/dL — AB (ref 204–475)
Fibrinogen: 96 mg/dL — CL (ref 204–475)

## 2014-03-18 MED ORDER — DEXAMETHASONE SODIUM PHOSPHATE 4 MG/ML IJ SOLN
2.0000 mg | Freq: Two times a day (BID) | INTRAMUSCULAR | Status: DC
Start: 2014-03-18 — End: 2014-03-19
  Administered 2014-03-18: 2 mg via INTRAVENOUS
  Filled 2014-03-18 (×3): qty 0.5

## 2014-03-18 MED ORDER — OXYCODONE HCL ER 10 MG PO T12A
10.0000 mg | EXTENDED_RELEASE_TABLET | Freq: Two times a day (BID) | ORAL | Status: DC
  Administered 2014-03-18 – 2014-03-20 (×4): 10 mg via ORAL
  Filled 2014-03-18 (×4): qty 1

## 2014-03-18 MED ORDER — MIDAZOLAM HCL 2 MG/2ML IJ SOLN
INTRAMUSCULAR | Status: AC | PRN
  Administered 2014-03-18: 2 mg via INTRAVENOUS
  Administered 2014-03-18: 0.5 mg via INTRAVENOUS
  Administered 2014-03-18: 1 mg via INTRAVENOUS
  Administered 2014-03-18: 0.5 mg via INTRAVENOUS

## 2014-03-18 MED ORDER — HYDROMORPHONE HCL PF 1 MG/ML IJ SOLN
1.0000 mg | INTRAMUSCULAR | Status: DC | PRN
  Administered 2014-03-18 – 2014-03-19 (×5): 1 mg via INTRAVENOUS
  Filled 2014-03-18 (×5): qty 1

## 2014-03-18 MED ORDER — FENTANYL CITRATE 0.05 MG/ML IJ SOLN
INTRAMUSCULAR | Status: AC | PRN
  Administered 2014-03-18: 25 ug via INTRAVENOUS
  Administered 2014-03-18: 50 ug via INTRAVENOUS
  Administered 2014-03-18: 25 ug via INTRAVENOUS

## 2014-03-18 MED ORDER — IOHEXOL 300 MG/ML  SOLN
100.0000 mL | Freq: Once | INTRAMUSCULAR | Status: AC | PRN
  Administered 2014-03-18: 120 mL via INTRAVENOUS

## 2014-03-18 MED ORDER — HYDROMORPHONE HCL PF 1 MG/ML IJ SOLN
INTRAMUSCULAR | Status: AC
  Filled 2014-03-18: qty 1

## 2014-03-18 MED ORDER — MIDAZOLAM HCL 2 MG/2ML IJ SOLN
INTRAMUSCULAR | Status: AC
  Filled 2014-03-18: qty 4

## 2014-03-18 MED ORDER — FENTANYL CITRATE 0.05 MG/ML IJ SOLN
INTRAMUSCULAR | Status: AC
  Filled 2014-03-18: qty 4

## 2014-03-18 NOTE — Sedation Documentation (Signed)
Pt moaning again, c/o increased pain, no level given.

## 2014-03-18 NOTE — Sedation Documentation (Signed)
Pt moaning. C/o pain increase 8/10 to left leg with angiojet use.

## 2014-03-18 NOTE — Sedation Documentation (Signed)
Popliteal catheters pulled and pressure held by Marinus MawBecky Fry. Pt tolerated well. VSS.  SB to SR, 43-61.

## 2014-03-18 NOTE — Progress Notes (Signed)
Subjective: Extensive clot throughout B lower extr into IVC and into existing IVC filter Thrombolysis started 7/18 Feels better today Waves of pain occur- mostly at groin Some nausea- no vomiting  Objective: Vital signs in last 24 hours: Temp:  [97.8 F (36.6 C)-98.6 F (37 C)] 97.8 F (36.6 C) (07/19 0400) Pulse Rate:  [47-83] 54 (07/19 0800) Resp:  [9-23] 23 (07/19 0800) BP: (87-152)/(50-90) 129/66 mmHg (07/19 0945) SpO2:  [85 %-100 %] 99 % (07/19 0800) Last BM Date: 03/14/14  Intake/Output from previous day: 07/18 0701 - 07/19 0700 In: 4169.8 [I.V.:3369.8; IV Piggyback:800] Out: 3425 [Urine:3425] Intake/Output this shift: Total I/O In: 167.5 [I.V.:117.5; IV Piggyback:50] Out: 475 [Urine:475]  PE:  Afeb; vss Urine good output- urine yellow B feet warm and good pulses Legs less swollen today- less tight feeling Scrotum with less swelling per pt  B lysis catheters intact - posterior knees No bleeding; no hematoma Clean and dry Sl tender on Rt  H/H stable Fibrinogen 107 plt 75 Bun/Cr stable   Lab Results:   Recent Labs  03/18/14 0223 03/18/14 0528  WBC 17.3* 16.0*  HGB 10.2* 9.8*  HCT 31.2* 30.3*  PLT 79* 75*   BMET  Recent Labs  03/17/14 0048 03/18/14 0223  NA 129* 135*  K 5.0 4.9  CL 90* 97  CO2 23 22  GLUCOSE 148* 139*  BUN 45* 48*  CREATININE 1.20 0.97  CALCIUM 9.6 8.7   PT/INR  Recent Labs  03/17/14 0720  LABPROT 15.9*  INR 1.27   ABG No results found for this basename: PHART, PCO2, PO2, HCO3,  in the last 72 hours  Studies/Results: Ct Abdomen Pelvis Wo Contrast  03/16/2014   CLINICAL DATA:  Back pain, hematuria  EXAM: CT ABDOMEN AND PELVIS WITHOUT CONTRAST  TECHNIQUE: Multidetector CT imaging of the abdomen and pelvis was performed following the standard protocol without IV contrast.  COMPARISON:  03/10/2010, 02/16/2008.  Dedicated spine CT 03/15/2014  FINDINGS: Patchy subpleural atelectasis is noted. Streak artifact from  spinal fusion hardware is noted with spinal stimulator in place. No pleural effusion.  Streak artifact from presumed RCA stent partly visualized. Depending gallstone noted within otherwise normal-appearing gallbladder. Unenhanced liver, adrenal glands, spleen, left kidney, and pancreas are unremarkable. 1.7 cm fluid density right upper renal pole cortical cyst incidentally noted. IVC filter in place with expansion of the bilateral iliac veins and sub-filter IVC to the level of the common femoral veins bilaterally, with change in vessel density seen best on coronal image 38. Compared to the prior similar exams 2011 and 2009, this finding is new.  Appendix is normal. No bowel wall thickening or focal segmental dilatation is identified. Bladder is normal. No ascites, free air, or lymphadenopathy. Lumbar fusion hardware is reidentified. Above-described hardware has been evaluated at dedicated imaging 03/15/2014 dictated separately. Remote left posterior tenth rib fracture reidentified. Allowing for differences in technique, no significant change in L1 and L2 compression deformities.  Anterior abdominal wall subcutaneous stranding is compatible with injection sites.  IMPRESSION: IVC expansion inferiorly to the bilateral common femoral veins, highly suspicious for deep venous thrombosis. Bilateral lower extremity Doppler venous ultrasound is recommended for further evaluation. Consider consultation with interventional radiology if thrombectomy is considered. Critical Value/emergent results were called by telephone at the time of interpretation on 03/16/2014 at 5:01 pm to Delice Bison, RN, who verbally acknowledged these results.  No acute intra-abdominal or pelvic pathology otherwise.   Electronically Signed   By: Christiana Pellant M.D.   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 History of prior IVC filter placement in 2007. CT demonstrates complete thrombosis of the IVC filter. The patient has severe pain, edema and is not able to ambulate normally. He now presents for catheter directed thrombolytic therapy.  EXAM: IR INFUSION THROMBOL VENOUS INITIAL (MS); INFERIOR VENA CAVOGRAM; IR ULTRASOUND GUIDANCE VASC ACCESS LEFT; IR ULTRASOUND GUIDANCE VASC ACCESS RIGHT; LEFT EXTREMITY VENOGRAPHY; RIGHT EXTREMITY VENOGRAPHY  1. ULTRASOUND GUIDANCE FOR VASCULAR ACCESS OF THE LEFT POPLITEAL VEIN 2. ULTRASOUND GUIDANCE FOR VASCULAR ACCESS OF THE RIGHT POPLITEAL VEIN 3. LEFT LOWER EXTREMITY VENOGRAPHY 4. RIGHT LOWER EXTREMITY VENOGRAPHY 5. IVC VENOGRAPHY 6. TRANSCATHETER THROMBOLYTIC THERAPY OF LEFT LOWER EXTREMITY DEEP VEINS AND IVC 7. TRANSCATHETER THROMBOLYTIC THERAPY OF RIGHT LOWER EXTREMITY DEEP VEINS AND IVC  COMPARISON:  CT of the abdomen and pelvis on 03/17/2014 as well as a Vascular Lab venous duplex of both lower extremities.  MEDICATIONS: Sedation:  3.5 mg IV versed and 75 mcg IV Fentanyl.  Total Moderate Sedation Time:  45 minutes.  CONTRAST:  50mL OMNIPAQUE IOHEXOL 300 MG/ML  SOLN  FLUOROSCOPY TIME:  8 min and 48 seconds.  PROCEDURE: Prior to the procedure, detailed informed consent was obtained from the patient and his wife. This included discussion of risks and benefits of thrombolytic therapy for DVT.  The patient was placed in a prone position. Bilateral popliteal regions were prepped with Betadine. Maximal Sterile Barrier Technique was utilized including caps, mask, sterile gowns, sterile gloves, sterile drape, hand hygiene and skin antiseptic. Local anesthesia was provided with 1% lidocaine.  Ultrasound was used to confirm patency of bilateral popliteal veins. Under direct ultrasound guidance, access of the left popliteal vein was performed with a 21 gauge needle. After securing access with a micropuncture set, a 6 French sheath was placed  over a guidewire.  Under ultrasound guidance, access of the right popliteal vein was performed with a 21 gauge needle and micropuncture set. A 6 French sheath was placed over a guidewire.  Venography of the left lower extremity was performed through the popliteal sheath as well as a 5 French catheter advanced through the popliteal, femoral and iliac veins. The catheter was further advanced into the inferior vena cava and additional venography performed to assess IVC patency as well as the IVC above the level of an indwelling filter. The catheter was advanced over a hydrophilic guidewire through the IVC filter allowing guidewire access up to the level of the upper IVC.  Venography of the right lower extremity was then performed through the popliteal sheath as well as a 5 French catheter advanced through the popliteal, femoral and iliac veins. The catheter was then advanced through the IVC with guidewire access secured from the right popliteal vein up through the top of the IVC filter.  The Angiojet system was utilized in performing power pulse thrombolytic therapy. Prior to performing a power pulse maneuver, 12 mg of tPA was mixed into 250 mL of normal saline. An Angiojet catheter was advanced to the top margin of the IVC filter via left popliteal access. Power pulse infusion of 113 mL of the diluted tPA solution was then performed via the Angiojet catheter as it was withdrawn in the IVC and left lower extremity.  The Angiojet catheter was then advanced over the right popliteal venous access wire to the level  of the upper IVC filter. Power pulse infusion of 120 mL of the diluted tPA solution was then performed via this catheter as it was withdrawn from the IVC down the right lower extremity.  A 5 French Unifuse infusion catheter measuring 90 cm in length with distal 50 cm infusion length was then advanced over a guidewire in the left lower extremity and IVC and positioned under fluoroscopy. Then identical length  Unifuse infusion catheter was then advanced over a guidewire and positioned in the IVC and right lower extremity under fluoroscopy.  Both infusion catheters were connected to Tenecteplase infusions at a rate of 0.25 milligrams/hour.  COMPLICATIONS: None  FINDINGS: Initial left lower extremity venography demonstrates minimal thrombus in the popliteal vein. Occlusive thrombus of the femoral vein begins in the mid to distal thigh. Based on guidewire passage, this thrombus is relatively soft and more acute in nature. Extensive and occlusive thrombus is identified throughout the iliac veins. This thrombus is more irregular and firmer based on guidewire passage and was more difficult to cross, consistent with older thrombus. The lower IVC was also completely thrombosed up through the level of an indwelling Trapease IVC filter. This thrombus also was more difficult to cross, consistent with older chronic thrombus.  Right lower extremity venography demonstrates minimal thrombus in the popliteal vein. The femoral vein largely contains nonocclusive thrombus but the thrombus becomes occlusive at the level of the common femoral vein. Right-sided iliac veins are completely occluded. Iliac vein clot also was firmer, consistent with older thrombus.  Diluted tPA was power pulsed into both lower extremity veins and the IVC. Thrombolytic infusion with Tenecteplase will also be started and continued overnight with bilateral infusion catheters infusing at a rate of 0.25 milligrams/hour. Fibrinogen, CBC heparin levels will be monitored during thrombolytic therapy. The patient was transferred to the intensive care unit for monitoring during thrombolytic therapy.  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 The IVC is completely thrombosed up through the upper level of the indwelling IVC filter. Diluted tPA was power pulse bilaterally utilizing and Angiojet catheter into clot. Bilateral  infusion catheter is were placed and extend from the thigh up through the IVC filter. These will be infused with Tenecteplase at a dose of 0.25 milligrams/hour overnight. Follow-up venography of both lower extremities and the IVC will be performed tomorrow.   Electronically Signed   By: Irish Lack M.D.   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 History of prior IVC filter placement in 2007. CT demonstrates complete thrombosis of the IVC filter. The patient has severe pain, edema and is not able to ambulate normally. He now presents for catheter directed thrombolytic therapy.  EXAM: IR INFUSION THROMBOL VENOUS INITIAL (MS); INFERIOR VENA CAVOGRAM; IR ULTRASOUND GUIDANCE VASC ACCESS LEFT; IR ULTRASOUND GUIDANCE VASC ACCESS RIGHT; LEFT EXTREMITY VENOGRAPHY; RIGHT EXTREMITY VENOGRAPHY  1. ULTRASOUND GUIDANCE FOR VASCULAR ACCESS OF THE LEFT POPLITEAL VEIN 2. ULTRASOUND GUIDANCE FOR VASCULAR ACCESS OF THE RIGHT POPLITEAL VEIN 3. LEFT LOWER EXTREMITY VENOGRAPHY 4. RIGHT LOWER EXTREMITY VENOGRAPHY 5. IVC VENOGRAPHY 6. TRANSCATHETER THROMBOLYTIC THERAPY OF LEFT LOWER EXTREMITY DEEP VEINS AND IVC 7. TRANSCATHETER THROMBOLYTIC THERAPY OF RIGHT LOWER EXTREMITY DEEP VEINS AND IVC  COMPARISON:  CT of the abdomen and pelvis on 03/17/2014 as well as a Vascular Lab venous duplex of  both lower extremities.  MEDICATIONS: Sedation:  3.5 mg IV versed and 75 mcg IV Fentanyl.  Total Moderate Sedation Time:  45 minutes.  CONTRAST:  50mL OMNIPAQUE IOHEXOL 300 MG/ML  SOLN  FLUOROSCOPY TIME:  8 min and 48 seconds.  PROCEDURE: Prior to the procedure, detailed informed consent was obtained from the patient and his wife. This included discussion of risks and benefits of thrombolytic therapy for DVT.  The patient was placed in a prone position.  Bilateral popliteal regions were prepped with Betadine. Maximal Sterile Barrier Technique was utilized including caps, mask, sterile gowns, sterile gloves, sterile drape, hand hygiene and skin antiseptic. Local anesthesia was provided with 1% lidocaine.  Ultrasound was used to confirm patency of bilateral popliteal veins. Under direct ultrasound guidance, access of the left popliteal vein was performed with a 21 gauge needle. After securing access with a micropuncture set, a 6 French sheath was placed over a guidewire.  Under ultrasound guidance, access of the right popliteal vein was performed with a 21 gauge needle and micropuncture set. A 6 French sheath was placed over a guidewire.  Venography of the left lower extremity was performed through the popliteal sheath as well as a 5 French catheter advanced through the popliteal, femoral and iliac veins. The catheter was further advanced into the inferior vena cava and additional venography performed to assess IVC patency as well as the IVC above the level of an indwelling filter. The catheter was advanced over a hydrophilic guidewire through the IVC filter allowing guidewire access up to the level of the upper IVC.  Venography of the right lower extremity was then performed through the popliteal sheath as well as a 5 French catheter advanced through the popliteal, femoral and iliac veins. The catheter was then advanced through the IVC with guidewire access secured from the right popliteal vein up through the top of the IVC filter.  The Angiojet system was utilized in performing power pulse thrombolytic therapy. Prior to performing a power pulse maneuver, 12 mg of tPA was mixed into 250 mL of normal saline. An Angiojet catheter was advanced to the top margin of the IVC filter via left popliteal access. Power pulse infusion of 113 mL of the diluted tPA solution was then performed via the Angiojet catheter as it was withdrawn in the IVC and left lower extremity.  The  Angiojet catheter was then advanced over the right popliteal venous access wire to the level of the upper IVC filter. Power pulse infusion of 120 mL of the diluted tPA solution was then performed via this catheter as it was withdrawn from the IVC down the right lower extremity.  A 5 French Unifuse infusion catheter measuring 90 cm in length with distal 50 cm infusion length was then advanced over a guidewire in the left lower extremity and IVC and positioned under fluoroscopy. Then identical length Unifuse infusion catheter was then advanced over a guidewire and positioned in the IVC and right lower extremity under fluoroscopy.  Both infusion catheters were connected to Tenecteplase infusions at a rate of 0.25 milligrams/hour.  COMPLICATIONS: None  FINDINGS: Initial left lower extremity venography demonstrates minimal thrombus in the popliteal vein. Occlusive thrombus of the femoral vein begins in the mid to distal thigh. Based on guidewire passage, this thrombus is relatively soft and more acute in nature. Extensive and occlusive thrombus is identified throughout the iliac veins. This thrombus is more irregular and firmer based on guidewire passage and was more difficult to cross, consistent with older  thrombus. The lower IVC was also completely thrombosed up through the level of an indwelling Trapease IVC filter. This thrombus also was more difficult to cross, consistent with older chronic thrombus.  Right lower extremity venography demonstrates minimal thrombus in the popliteal vein. The femoral vein largely contains nonocclusive thrombus but the thrombus becomes occlusive at the level of the common femoral vein. Right-sided iliac veins are completely occluded. Iliac vein clot also was firmer, consistent with older thrombus.  Diluted tPA was power pulsed into both lower extremity veins and the IVC. Thrombolytic infusion with Tenecteplase will also be started and continued overnight with bilateral infusion  catheters infusing at a rate of 0.25 milligrams/hour. Fibrinogen, CBC heparin levels will be monitored during thrombolytic therapy. The patient was transferred to the intensive care unit for monitoring during thrombolytic therapy.  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 The IVC is completely thrombosed up through the upper level of the indwelling IVC filter. Diluted tPA was power pulse bilaterally utilizing and Angiojet catheter into clot. Bilateral infusion catheter is were placed and extend from the thigh up through the IVC filter. These will be infused with Tenecteplase at a dose of 0.25 milligrams/hour overnight. Follow-up venography of both lower extremities and the IVC will be performed tomorrow.   Electronically Signed   By: Irish Lack M.D.   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 History of prior IVC filter placement in 2007. CT demonstrates complete thrombosis of the IVC filter. The patient has severe pain, edema and is not able to ambulate normally. He now presents for catheter directed thrombolytic therapy.  EXAM: IR INFUSION THROMBOL VENOUS INITIAL (MS); INFERIOR VENA CAVOGRAM; IR ULTRASOUND GUIDANCE VASC ACCESS LEFT; IR ULTRASOUND GUIDANCE VASC ACCESS RIGHT; LEFT EXTREMITY VENOGRAPHY; RIGHT EXTREMITY VENOGRAPHY  1. ULTRASOUND GUIDANCE FOR VASCULAR ACCESS OF THE LEFT POPLITEAL VEIN 2. ULTRASOUND GUIDANCE FOR VASCULAR ACCESS OF THE RIGHT POPLITEAL VEIN 3. LEFT LOWER EXTREMITY VENOGRAPHY 4. RIGHT LOWER EXTREMITY VENOGRAPHY 5. IVC VENOGRAPHY 6. TRANSCATHETER THROMBOLYTIC THERAPY OF LEFT LOWER EXTREMITY DEEP VEINS AND IVC 7. TRANSCATHETER THROMBOLYTIC THERAPY OF RIGHT LOWER EXTREMITY DEEP VEINS AND IVC  COMPARISON:  CT of the abdomen and pelvis on  03/17/2014 as well as a Vascular Lab venous duplex of both lower extremities.  MEDICATIONS: Sedation:  3.5 mg IV versed and 75 mcg IV Fentanyl.  Total Moderate Sedation Time:  45 minutes.  CONTRAST:  50mL OMNIPAQUE IOHEXOL 300 MG/ML  SOLN  FLUOROSCOPY TIME:  8 min and 48 seconds.  PROCEDURE: Prior to the procedure, detailed informed consent was obtained from the patient and his wife. This included discussion of risks and benefits of thrombolytic therapy for DVT.  The patient was placed in a prone position. Bilateral popliteal regions were prepped with Betadine. Maximal Sterile Barrier Technique was utilized including caps, mask, sterile gowns, sterile gloves, sterile drape, hand hygiene and skin antiseptic. Local anesthesia was provided with 1% lidocaine.  Ultrasound was used to confirm patency of bilateral popliteal veins. Under direct ultrasound guidance, access of the left popliteal vein was performed with a 21 gauge needle. After securing access with a micropuncture set, a 6 French sheath was placed over a guidewire.  Under ultrasound guidance, access of the right popliteal vein was performed with a 21 gauge needle and micropuncture set. A 6  French sheath was placed over a guidewire.  Venography of the left lower extremity was performed through the popliteal sheath as well as a 5 French catheter advanced through the popliteal, femoral and iliac veins. The catheter was further advanced into the inferior vena cava and additional venography performed to assess IVC patency as well as the IVC above the level of an indwelling filter. The catheter was advanced over a hydrophilic guidewire through the IVC filter allowing guidewire access up to the level of the upper IVC.  Venography of the right lower extremity was then performed through the popliteal sheath as well as a 5 French catheter advanced through the popliteal, femoral and iliac veins. The catheter was then advanced through the IVC with guidewire access secured  from the right popliteal vein up through the top of the IVC filter.  The Angiojet system was utilized in performing power pulse thrombolytic therapy. Prior to performing a power pulse maneuver, 12 mg of tPA was mixed into 250 mL of normal saline. An Angiojet catheter was advanced to the top margin of the IVC filter via left popliteal access. Power pulse infusion of 113 mL of the diluted tPA solution was then performed via the Angiojet catheter as it was withdrawn in the IVC and left lower extremity.  The Angiojet catheter was then advanced over the right popliteal venous access wire to the level of the upper IVC filter. Power pulse infusion of 120 mL of the diluted tPA solution was then performed via this catheter as it was withdrawn from the IVC down the right lower extremity.  A 5 French Unifuse infusion catheter measuring 90 cm in length with distal 50 cm infusion length was then advanced over a guidewire in the left lower extremity and IVC and positioned under fluoroscopy. Then identical length Unifuse infusion catheter was then advanced over a guidewire and positioned in the IVC and right lower extremity under fluoroscopy.  Both infusion catheters were connected to Tenecteplase infusions at a rate of 0.25 milligrams/hour.  COMPLICATIONS: None  FINDINGS: Initial left lower extremity venography demonstrates minimal thrombus in the popliteal vein. Occlusive thrombus of the femoral vein begins in the mid to distal thigh. Based on guidewire passage, this thrombus is relatively soft and more acute in nature. Extensive and occlusive thrombus is identified throughout the iliac veins. This thrombus is more irregular and firmer based on guidewire passage and was more difficult to cross, consistent with older thrombus. The lower IVC was also completely thrombosed up through the level of an indwelling Trapease IVC filter. This thrombus also was more difficult to cross, consistent with older chronic thrombus.  Right lower  extremity venography demonstrates minimal thrombus in the popliteal vein. The femoral vein largely contains nonocclusive thrombus but the thrombus becomes occlusive at the level of the common femoral vein. Right-sided iliac veins are completely occluded. Iliac vein clot also was firmer, consistent with older thrombus.  Diluted tPA was power pulsed into both lower extremity veins and the IVC. Thrombolytic infusion with Tenecteplase will also be started and continued overnight with bilateral infusion catheters infusing at a rate of 0.25 milligrams/hour. Fibrinogen, CBC heparin levels will be monitored during thrombolytic therapy. The patient was transferred to the intensive care unit for monitoring during thrombolytic therapy.  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 The IVC is completely thrombosed up through the upper level of the indwelling IVC filter. Diluted tPA was power pulse bilaterally utilizing and Angiojet catheter into clot. Bilateral infusion  catheter is were placed and extend from the thigh up through the IVC filter. These will be infused with Tenecteplase at a dose of 0.25 milligrams/hour overnight. Follow-up venography of both lower extremities and the IVC will be performed tomorrow.   Electronically Signed   By: Irish Lack M.D.   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 History of prior IVC filter placement in 2007. CT demonstrates complete thrombosis of the IVC filter. The patient has severe pain, edema and is not able to ambulate normally. He now presents for catheter directed thrombolytic therapy.  EXAM: IR INFUSION THROMBOL VENOUS INITIAL (MS); INFERIOR VENA CAVOGRAM; IR ULTRASOUND GUIDANCE VASC ACCESS LEFT; IR ULTRASOUND GUIDANCE VASC  ACCESS RIGHT; LEFT EXTREMITY VENOGRAPHY; RIGHT EXTREMITY VENOGRAPHY  1. ULTRASOUND GUIDANCE FOR VASCULAR ACCESS OF THE LEFT POPLITEAL VEIN 2. ULTRASOUND GUIDANCE FOR VASCULAR ACCESS OF THE RIGHT POPLITEAL VEIN 3. LEFT LOWER EXTREMITY VENOGRAPHY 4. RIGHT LOWER EXTREMITY VENOGRAPHY 5. IVC VENOGRAPHY 6. TRANSCATHETER THROMBOLYTIC THERAPY OF LEFT LOWER EXTREMITY DEEP VEINS AND IVC 7. TRANSCATHETER THROMBOLYTIC THERAPY OF RIGHT LOWER EXTREMITY DEEP VEINS AND IVC  COMPARISON:  CT of the abdomen and pelvis on 03/17/2014 as well as a Vascular Lab venous duplex of both lower extremities.  MEDICATIONS: Sedation:  3.5 mg IV versed and 75 mcg IV Fentanyl.  Total Moderate Sedation Time:  45 minutes.  CONTRAST:  50mL OMNIPAQUE IOHEXOL 300 MG/ML  SOLN  FLUOROSCOPY TIME:  8 min and 48 seconds.  PROCEDURE: Prior to the procedure, detailed informed consent was obtained from the patient and his wife. This included discussion of risks and benefits of thrombolytic therapy for DVT.  The patient was placed in a prone position. Bilateral popliteal regions were prepped with Betadine. Maximal Sterile Barrier Technique was utilized including caps, mask, sterile gowns, sterile gloves, sterile drape, hand hygiene and skin antiseptic. Local anesthesia was provided with 1% lidocaine.  Ultrasound was used to confirm patency of bilateral popliteal veins. Under direct ultrasound guidance, access of the left popliteal vein was performed with a 21 gauge needle. After securing access with a micropuncture set, a 6 French sheath was placed over a guidewire.  Under ultrasound guidance, access of the right popliteal vein was performed with a 21 gauge needle and micropuncture set. A 6 French sheath was placed over a guidewire.  Venography of the left lower extremity was performed through the popliteal sheath as well as a 5 French catheter advanced through the popliteal, femoral and iliac veins. The catheter was further advanced into the inferior vena cava  and additional venography performed to assess IVC patency as well as the IVC above the level of an indwelling filter. The catheter was advanced over a hydrophilic guidewire through the IVC filter allowing guidewire access up to the level of the upper IVC.  Venography of the right lower extremity was then performed through the popliteal sheath as well as a 5 French catheter advanced through the popliteal, femoral and iliac veins. The catheter was then advanced through the IVC with guidewire access secured from the right popliteal vein up through the top of the IVC filter.  The Angiojet system was utilized in performing power pulse thrombolytic therapy. Prior to performing a power pulse maneuver, 12 mg of tPA was mixed into 250 mL of normal saline. An Angiojet catheter was advanced  to the top margin of the IVC filter via left popliteal access. Power pulse infusion of 113 mL of the diluted tPA solution was then performed via the Angiojet catheter as it was withdrawn in the IVC and left lower extremity.  The Angiojet catheter was then advanced over the right popliteal venous access wire to the level of the upper IVC filter. Power pulse infusion of 120 mL of the diluted tPA solution was then performed via this catheter as it was withdrawn from the IVC down the right lower extremity.  A 5 French Unifuse infusion catheter measuring 90 cm in length with distal 50 cm infusion length was then advanced over a guidewire in the left lower extremity and IVC and positioned under fluoroscopy. Then identical length Unifuse infusion catheter was then advanced over a guidewire and positioned in the IVC and right lower extremity under fluoroscopy.  Both infusion catheters were connected to Tenecteplase infusions at a rate of 0.25 milligrams/hour.  COMPLICATIONS: None  FINDINGS: Initial left lower extremity venography demonstrates minimal thrombus in the popliteal vein. Occlusive thrombus of the femoral vein begins in the mid to distal  thigh. Based on guidewire passage, this thrombus is relatively soft and more acute in nature. Extensive and occlusive thrombus is identified throughout the iliac veins. This thrombus is more irregular and firmer based on guidewire passage and was more difficult to cross, consistent with older thrombus. The lower IVC was also completely thrombosed up through the level of an indwelling Trapease IVC filter. This thrombus also was more difficult to cross, consistent with older chronic thrombus.  Right lower extremity venography demonstrates minimal thrombus in the popliteal vein. The femoral vein largely contains nonocclusive thrombus but the thrombus becomes occlusive at the level of the common femoral vein. Right-sided iliac veins are completely occluded. Iliac vein clot also was firmer, consistent with older thrombus.  Diluted tPA was power pulsed into both lower extremity veins and the IVC. Thrombolytic infusion with Tenecteplase will also be started and continued overnight with bilateral infusion catheters infusing at a rate of 0.25 milligrams/hour. Fibrinogen, CBC heparin levels will be monitored during thrombolytic therapy. The patient was transferred to the intensive care unit for monitoring during thrombolytic therapy.  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 The IVC is completely thrombosed up through the upper level of the indwelling IVC filter. Diluted tPA was power pulse bilaterally utilizing and Angiojet catheter into clot. Bilateral infusion catheter is were placed and extend from the thigh up through the IVC filter. These will be infused with Tenecteplase at a dose of 0.25 milligrams/hour overnight. Follow-up venography of both lower extremities and the IVC will be performed tomorrow.   Electronically Signed   By: Irish Lack M.D.   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 History of prior IVC filter placement in 2007. CT demonstrates complete thrombosis of the IVC filter. The patient has severe pain, edema and is not able to ambulate normally. He now presents for catheter directed thrombolytic therapy.  EXAM: IR INFUSION THROMBOL VENOUS INITIAL (MS); INFERIOR VENA CAVOGRAM; IR ULTRASOUND GUIDANCE VASC ACCESS LEFT; IR ULTRASOUND GUIDANCE VASC ACCESS RIGHT; LEFT EXTREMITY VENOGRAPHY; RIGHT EXTREMITY VENOGRAPHY  1. ULTRASOUND GUIDANCE FOR VASCULAR ACCESS OF THE LEFT POPLITEAL VEIN 2. ULTRASOUND GUIDANCE FOR VASCULAR ACCESS OF  THE RIGHT POPLITEAL VEIN 3. LEFT LOWER EXTREMITY VENOGRAPHY 4. RIGHT LOWER EXTREMITY VENOGRAPHY 5. IVC VENOGRAPHY 6. TRANSCATHETER THROMBOLYTIC THERAPY OF LEFT LOWER EXTREMITY DEEP VEINS AND IVC 7. TRANSCATHETER THROMBOLYTIC THERAPY OF RIGHT LOWER EXTREMITY DEEP VEINS AND IVC  COMPARISON:  CT of the abdomen and pelvis on 03/17/2014 as well as a Vascular Lab venous duplex of both lower extremities.  MEDICATIONS: Sedation:  3.5 mg IV versed and 75 mcg IV Fentanyl.  Total Moderate Sedation Time:  45 minutes.  CONTRAST:  50mL OMNIPAQUE IOHEXOL 300 MG/ML  SOLN  FLUOROSCOPY TIME:  8 min and 48 seconds.  PROCEDURE: Prior to the procedure, detailed informed consent was obtained from the patient and his wife. This included discussion of risks and benefits of thrombolytic therapy for DVT.  The patient was placed in a prone position. Bilateral popliteal regions were prepped with Betadine. Maximal Sterile Barrier Technique was utilized including caps, mask, sterile gowns, sterile gloves, sterile drape, hand hygiene and skin antiseptic. Local anesthesia was provided with 1% lidocaine.  Ultrasound was used to confirm patency of bilateral popliteal veins. Under direct ultrasound guidance, access of the left popliteal vein was performed with  a 21 gauge needle. After securing access with a micropuncture set, a 6 French sheath was placed over a guidewire.  Under ultrasound guidance, access of the right popliteal vein was performed with a 21 gauge needle and micropuncture set. A 6 French sheath was placed over a guidewire.  Venography of the left lower extremity was performed through the popliteal sheath as well as a 5 French catheter advanced through the popliteal, femoral and iliac veins. The catheter was further advanced into the inferior vena cava and additional venography performed to assess IVC patency as well as the IVC above the level of an indwelling filter. The catheter was advanced over a hydrophilic guidewire through the IVC filter allowing guidewire access up to the level of the upper IVC.  Venography of the right lower extremity was then performed through the popliteal sheath as well as a 5 French catheter advanced through the popliteal, femoral and iliac veins. The catheter was then advanced through the IVC with guidewire access secured from the right popliteal vein up through the top of the IVC filter.  The Angiojet system was utilized in performing power pulse thrombolytic therapy. Prior to performing a power pulse maneuver, 12 mg of tPA was mixed into 250 mL of normal saline. An Angiojet catheter was advanced to the top margin of the IVC filter via left popliteal access. Power pulse infusion of 113 mL of the diluted tPA solution was then performed via the Angiojet catheter as it was withdrawn in the IVC and left lower extremity.  The Angiojet catheter was then advanced over the right popliteal venous access wire to the level of the upper IVC filter. Power pulse infusion of 120 mL of the diluted tPA solution was then performed via this catheter as it was withdrawn from the IVC down the right lower extremity.  A 5 French Unifuse infusion catheter measuring 90 cm in length with distal 50 cm infusion length was then advanced over a guidewire  in the left lower extremity and IVC and positioned under fluoroscopy. Then identical length Unifuse infusion catheter was then advanced over a guidewire and positioned in the IVC and right lower extremity under fluoroscopy.  Both infusion catheters were connected to Tenecteplase infusions at a rate of 0.25 milligrams/hour.  COMPLICATIONS: None  FINDINGS: Initial left lower extremity venography demonstrates minimal thrombus  in the popliteal vein. Occlusive thrombus of the femoral vein begins in the mid to distal thigh. Based on guidewire passage, this thrombus is relatively soft and more acute in nature. Extensive and occlusive thrombus is identified throughout the iliac veins. This thrombus is more irregular and firmer based on guidewire passage and was more difficult to cross, consistent with older thrombus. The lower IVC was also completely thrombosed up through the level of an indwelling Trapease IVC filter. This thrombus also was more difficult to cross, consistent with older chronic thrombus.  Right lower extremity venography demonstrates minimal thrombus in the popliteal vein. The femoral vein largely contains nonocclusive thrombus but the thrombus becomes occlusive at the level of the common femoral vein. Right-sided iliac veins are completely occluded. Iliac vein clot also was firmer, consistent with older thrombus.  Diluted tPA was power pulsed into both lower extremity veins and the IVC. Thrombolytic infusion with Tenecteplase will also be started and continued overnight with bilateral infusion catheters infusing at a rate of 0.25 milligrams/hour. Fibrinogen, CBC heparin levels will be monitored during thrombolytic therapy. The patient was transferred to the intensive care unit for monitoring during thrombolytic therapy.  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 The IVC is completely thrombosed up through the upper level of the indwelling IVC filter.  Diluted tPA was power pulse bilaterally utilizing and Angiojet catheter into clot. Bilateral infusion catheter is were placed and extend from the thigh up through the IVC filter. These will be infused with Tenecteplase at a dose of 0.25 milligrams/hour overnight. Follow-up venography of both lower extremities and the IVC will be performed tomorrow.   Electronically Signed   By: Irish Lack M.D.   On: 03/17/2014 15:21   Ct Angio Abd/pel W/ And/or W/o  03/17/2014   CLINICAL DATA:  Known deep venous thrombosis within both lower extremities. Assess for extension of thrombus to the abdomen or pelvis.  EXAM: CT VENOGRAM ABDOMEN AND PELVIS WITHOUT AND WITH CONTRAST  TECHNIQUE: Multidetector CT imaging of the abdomen and pelvis was performed using the standard protocol during bolus administration of intravenous contrast. Multiplanar reconstructed images and MIPs were obtained and reviewed to evaluate the venous vascular anatomy.  CONTRAST:  OMNIPAQUE IOHEXOL 300 MG/ML  SOLN  COMPARISON:  None.  FINDINGS: On coronal images, there appears to be uniformly decreased attenuation extending within the infrarenal IVC, across the patient's IVC filter. It ends 4 cm below the level of the renal arteries. This attenuation is grossly stable across multiple sets of images, raising suspicion for diffuse acute thrombus. There is associated distention of the infrarenal IVC, common iliac veins, and external iliac veins. The patient's IVC filter is noted inferior to the renal veins, somewhat lower than typically seen.  The portal venous system is unremarkable in appearance. The abdominal aorta demonstrates mild scattered calcification, but is otherwise grossly unremarkable. The visualized branches of the abdominal aorta are patent, including the celiac trunk, superior mesenteric artery, bilateral renal arteries and inferior mesenteric artery.  Mild bibasilar atelectasis is noted. Diffuse coronary artery calcifications are seen.   The liver is unremarkable in appearance. A few scattered calcified granulomata are seen within the spleen. The gallbladder is within normal limits. The pancreas and adrenal glands are unremarkable.  Scattered small bilateral renal cysts are seen. The kidneys are otherwise unremarkable. There is no evidence of hydronephrosis. No renal or ureteral stones are seen. No perinephric stranding is appreciated.  No free fluid is identified. The small  bowel is unremarkable in appearance. The stomach is within normal limits. No acute vascular abnormalities are seen.  The appendix is normal in caliber, without evidence for appendicitis. A few tiny diverticula are noted along the visualized colon; the colon is otherwise unremarkable.  The bladder is mildly distended and grossly unremarkable. The prostate remains normal in size, with scattered calcification.  Prominent anterior pelvic sidewall nodes are seen bilaterally, measuring up to 1.8 cm on the left and 1.3 cm on the right. These are of uncertain significance, but may reflect the acute thrombosis of the adjacent venous vasculature. No inguinal lymphadenopathy is seen.  No acute osseous abnormalities are identified. The patient is status post interbody fusion at T10-T12, and there is mild chronic loss of height at L1 and L2. There is partial osseous fusion at L5-S1, with associated hardware. Spinal stimulation leads and an associated metallic device are noted within the soft tissues of the back.  Review of the MIP images confirms the above findings.  IMPRESSION: 1. Suspect acute thrombosis filling the external iliac veins and common iliac veins bilaterally, and along the infrarenal IVC. This appears to extend across the IVC filter, though it ends approximately 4 cm below the level of the renal arteries. Associated distention of the venous vasculature, without definite evidence of associated thrombophlebitis. 2. Prominent anterior pelvic sidewall nodes noted bilaterally,  measuring up to 1.8 cm on the left and 1.3 cm on the right. These are of uncertain significance, but may reflect the acute thrombosis of the adjacent venous vasculature. 3. Diffuse coronary artery calcifications seen. 4. Mild scattered calcification along the abdominal aorta. 5. Mild bibasilar atelectasis noted. 6. Postoperative changes along the lower thoracic and lumbar spine, with mild chronic loss of height at L1 and L2.  These results were called by telephone at the time of interpretation on 03/17/2014 at 1:37 am to Nursing on [facility], who verbally acknowledged these results.   Electronically Signed   By: Roanna Raider M.D.   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 History of prior IVC filter placement in 2007. CT demonstrates complete thrombosis of the IVC filter. The patient has severe pain, edema and is not able to ambulate normally. He now presents for catheter directed thrombolytic therapy.  EXAM: IR INFUSION THROMBOL VENOUS INITIAL (MS); INFERIOR VENA CAVOGRAM; IR ULTRASOUND GUIDANCE VASC ACCESS LEFT; IR ULTRASOUND GUIDANCE VASC ACCESS RIGHT; LEFT EXTREMITY VENOGRAPHY; RIGHT EXTREMITY VENOGRAPHY  1. ULTRASOUND GUIDANCE FOR VASCULAR ACCESS OF THE LEFT POPLITEAL VEIN 2. ULTRASOUND GUIDANCE FOR VASCULAR ACCESS OF THE RIGHT POPLITEAL VEIN 3. LEFT LOWER EXTREMITY VENOGRAPHY 4. RIGHT LOWER EXTREMITY VENOGRAPHY 5. IVC VENOGRAPHY 6. TRANSCATHETER THROMBOLYTIC THERAPY OF LEFT LOWER EXTREMITY DEEP VEINS AND IVC 7. TRANSCATHETER THROMBOLYTIC THERAPY OF RIGHT LOWER EXTREMITY DEEP VEINS AND IVC  COMPARISON:  CT of the abdomen and pelvis on 03/17/2014 as well as a Vascular Lab venous duplex of both lower extremities.  MEDICATIONS: Sedation:  3.5 mg IV versed and 75 mcg IV Fentanyl.  Total Moderate  Sedation Time:  45 minutes.  CONTRAST:  50mL OMNIPAQUE IOHEXOL 300 MG/ML  SOLN  FLUOROSCOPY TIME:  8 min and 48 seconds.  PROCEDURE: Prior to the procedure, detailed informed consent was obtained from the patient and his wife. This included discussion of risks and benefits of thrombolytic therapy for DVT.  The patient was placed in a prone position. Bilateral popliteal regions were prepped with Betadine. Maximal Sterile Barrier Technique was utilized including caps, mask, sterile gowns, sterile gloves, sterile drape, hand hygiene and skin antiseptic. Local anesthesia was provided with 1% lidocaine.  Ultrasound was used to confirm patency of bilateral popliteal veins. Under direct ultrasound guidance, access of the left popliteal vein was performed with a 21 gauge needle. After securing access with a micropuncture set, a 6 French sheath was placed over a guidewire.  Under ultrasound guidance, access of the right popliteal vein was performed with a 21 gauge needle and micropuncture set. A 6 French sheath was placed over a guidewire.  Venography of the left lower extremity was performed through the popliteal sheath as well as a 5 French catheter advanced through the popliteal, femoral and iliac veins. The catheter was further advanced into the inferior vena cava and additional venography performed to assess IVC patency as well as the IVC above the level of an indwelling filter. The catheter was advanced over a hydrophilic guidewire through the IVC filter allowing guidewire access up to the level of the upper IVC.  Venography of the right lower extremity was then performed through the popliteal sheath as well as a 5 French catheter advanced through the popliteal, femoral and iliac veins. The catheter was then advanced through the IVC with guidewire access secured from the right popliteal vein up through the top of the IVC filter.  The Angiojet system was utilized in performing power pulse thrombolytic therapy. Prior to  performing a power pulse maneuver, 12 mg of tPA was mixed into 250 mL of normal saline. An Angiojet catheter was advanced to the top margin of the IVC filter via left popliteal access. Power pulse infusion of 113 mL of the diluted tPA solution was then performed via the Angiojet catheter as it was withdrawn in the IVC and left lower extremity.  The Angiojet catheter was then advanced over the right popliteal venous access wire to the level of the upper IVC filter. Power pulse infusion of 120 mL of the diluted tPA solution was then performed via this catheter as it was withdrawn from the IVC down the right lower extremity.  A 5 French Unifuse infusion catheter measuring 90 cm in length with distal 50 cm infusion length was then advanced over a guidewire in the left lower extremity and IVC and positioned under fluoroscopy. Then identical length Unifuse infusion catheter was then advanced over a guidewire and positioned in the IVC and right lower extremity under fluoroscopy.  Both infusion catheters were connected to Tenecteplase infusions at a rate of 0.25 milligrams/hour.  COMPLICATIONS: None  FINDINGS: Initial left lower extremity venography demonstrates minimal thrombus in the popliteal vein. Occlusive thrombus of the femoral vein begins in the mid to distal thigh. Based on guidewire passage, this thrombus is relatively soft and more acute in nature. Extensive and occlusive thrombus is identified throughout the iliac veins. This thrombus is more irregular and firmer based on guidewire passage and was more difficult to cross, consistent with older thrombus. The lower IVC was also completely thrombosed up through the level of an indwelling Trapease IVC filter. This thrombus also was more difficult to cross, consistent with older chronic thrombus.  Right lower extremity venography demonstrates minimal thrombus in the popliteal vein. The femoral vein largely contains nonocclusive thrombus but the thrombus becomes  occlusive at the level of the common femoral vein. Right-sided iliac veins are completely occluded. Iliac vein clot also  was firmer, consistent with older thrombus.  Diluted tPA was power pulsed into both lower extremity veins and the IVC. Thrombolytic infusion with Tenecteplase will also be started and continued overnight with bilateral infusion catheters infusing at a rate of 0.25 milligrams/hour. Fibrinogen, CBC heparin levels will be monitored during thrombolytic therapy. The patient was transferred to the intensive care unit for monitoring during thrombolytic therapy.  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 The IVC is completely thrombosed up through the upper level of the indwelling IVC filter. Diluted tPA was power pulse bilaterally utilizing and Angiojet catheter into clot. Bilateral infusion catheter is were placed and extend from the thigh up through the IVC filter. These will be infused with Tenecteplase at a dose of 0.25 milligrams/hour overnight. Follow-up venography of both lower extremities and the IVC will be performed tomorrow.   Electronically Signed   By: Irish Lack M.D.   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 History of prior IVC filter placement in 2007. CT demonstrates complete thrombosis of the IVC filter. The patient has severe pain, edema and is not able to ambulate normally. He now presents for catheter directed thrombolytic therapy.  EXAM: IR INFUSION THROMBOL VENOUS INITIAL (MS); INFERIOR VENA CAVOGRAM; IR ULTRASOUND GUIDANCE VASC ACCESS LEFT; IR ULTRASOUND GUIDANCE VASC ACCESS RIGHT; LEFT EXTREMITY VENOGRAPHY; RIGHT EXTREMITY VENOGRAPHY  1. ULTRASOUND GUIDANCE FOR VASCULAR ACCESS OF THE LEFT POPLITEAL VEIN 2.  ULTRASOUND GUIDANCE FOR VASCULAR ACCESS OF THE RIGHT POPLITEAL VEIN 3. LEFT LOWER EXTREMITY VENOGRAPHY 4. RIGHT LOWER EXTREMITY VENOGRAPHY 5. IVC VENOGRAPHY 6. TRANSCATHETER THROMBOLYTIC THERAPY OF LEFT LOWER EXTREMITY DEEP VEINS AND IVC 7. TRANSCATHETER THROMBOLYTIC THERAPY OF RIGHT LOWER EXTREMITY DEEP VEINS AND IVC  COMPARISON:  CT of the abdomen and pelvis on 03/17/2014 as well as a Vascular Lab venous duplex of both lower extremities.  MEDICATIONS: Sedation:  3.5 mg IV versed and 75 mcg IV Fentanyl.  Total Moderate Sedation Time:  45 minutes.  CONTRAST:  50mL OMNIPAQUE IOHEXOL 300 MG/ML  SOLN  FLUOROSCOPY TIME:  8 min and 48 seconds.  PROCEDURE: Prior to the procedure, detailed informed consent was obtained from the patient and his wife. This included discussion of risks and benefits of thrombolytic therapy for DVT.  The patient was placed in a prone position. Bilateral popliteal regions were prepped with Betadine. Maximal Sterile Barrier Technique was utilized including caps, mask, sterile gowns, sterile gloves, sterile drape, hand hygiene and skin antiseptic. Local anesthesia was provided with 1% lidocaine.  Ultrasound was used to confirm patency of bilateral popliteal veins. Under direct ultrasound guidance, access of the left popliteal vein was performed with a 21 gauge needle. After securing access with a micropuncture set, a 6 French sheath was placed over a guidewire.  Under ultrasound guidance, access of the right popliteal vein was performed with a 21 gauge needle and micropuncture set. A 6 French sheath was placed over a guidewire.  Venography of the left lower extremity was performed through the popliteal sheath as well as a 5 French catheter advanced through the popliteal, femoral and iliac veins. The catheter was further advanced into the inferior vena cava and additional venography performed to assess IVC patency as well as the IVC above the level of an indwelling filter. The catheter was advanced  over  a hydrophilic guidewire through the IVC filter allowing guidewire access up to the level of the upper IVC.  Venography of the right lower extremity was then performed through the popliteal sheath as well as a 5 French catheter advanced through the popliteal, femoral and iliac veins. The catheter was then advanced through the IVC with guidewire access secured from the right popliteal vein up through the top of the IVC filter.  The Angiojet system was utilized in performing power pulse thrombolytic therapy. Prior to performing a power pulse maneuver, 12 mg of tPA was mixed into 250 mL of normal saline. An Angiojet catheter was advanced to the top margin of the IVC filter via left popliteal access. Power pulse infusion of 113 mL of the diluted tPA solution was then performed via the Angiojet catheter as it was withdrawn in the IVC and left lower extremity.  The Angiojet catheter was then advanced over the right popliteal venous access wire to the level of the upper IVC filter. Power pulse infusion of 120 mL of the diluted tPA solution was then performed via this catheter as it was withdrawn from the IVC down the right lower extremity.  A 5 French Unifuse infusion catheter measuring 90 cm in length with distal 50 cm infusion length was then advanced over a guidewire in the left lower extremity and IVC and positioned under fluoroscopy. Then identical length Unifuse infusion catheter was then advanced over a guidewire and positioned in the IVC and right lower extremity under fluoroscopy.  Both infusion catheters were connected to Tenecteplase infusions at a rate of 0.25 milligrams/hour.  COMPLICATIONS: None  FINDINGS: Initial left lower extremity venography demonstrates minimal thrombus in the popliteal vein. Occlusive thrombus of the femoral vein begins in the mid to distal thigh. Based on guidewire passage, this thrombus is relatively soft and more acute in nature. Extensive and occlusive thrombus is identified  throughout the iliac veins. This thrombus is more irregular and firmer based on guidewire passage and was more difficult to cross, consistent with older thrombus. The lower IVC was also completely thrombosed up through the level of an indwelling Trapease IVC filter. This thrombus also was more difficult to cross, consistent with older chronic thrombus.  Right lower extremity venography demonstrates minimal thrombus in the popliteal vein. The femoral vein largely contains nonocclusive thrombus but the thrombus becomes occlusive at the level of the common femoral vein. Right-sided iliac veins are completely occluded. Iliac vein clot also was firmer, consistent with older thrombus.  Diluted tPA was power pulsed into both lower extremity veins and the IVC. Thrombolytic infusion with Tenecteplase will also be started and continued overnight with bilateral infusion catheters infusing at a rate of 0.25 milligrams/hour. Fibrinogen, CBC heparin levels will be monitored during thrombolytic therapy. The patient was transferred to the intensive care unit for monitoring during thrombolytic therapy.  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 The IVC is completely thrombosed up through the upper level of the indwelling IVC filter. Diluted tPA was power pulse bilaterally utilizing and Angiojet catheter into clot. Bilateral infusion catheter is were placed and extend from the thigh up through the IVC filter. These will be infused with Tenecteplase at a dose of 0.25 milligrams/hour overnight. Follow-up venography of both lower extremities and the IVC will be performed tomorrow.   Electronically Signed   By: Irish LackGlenn  Yamagata M.D.   On: 03/17/2014 15:21    Anti-infectives: Anti-infectives   None      Assessment/Plan: s/p *  No surgery found *  Venous thrombolysis started 7/18 B lower extr into IVC Better/less painful today For re check today   LOS: 3 days     Rainen Vanrossum A 03/18/2014

## 2014-03-18 NOTE — Procedures (Signed)
Procedure:  Follow up venography 24 hrs into bilateral LE and IVC thrombolysis; bilateral venous mechanical thrombectomy Findings: Tremendous improvement in venous patency overnight with only mild amount of residual nonocclusive thrombus in left external and common iliac veins, right common iliac vein, and IVC at level of filter. Angiojet mechanical thrombectomy performed bilaterally and in IVC at levels of residual clot. Widely patent flow present in both LE's on completion and in IVC filter.  Small amount of adherent chronic thrombus at apex of filter and near base. Plan: D/C thrombolytic therapy.  Continue IV heparin per Pharmacy. Elevate legs.  Bedrest x 4 hours. Would consider thigh high compression stockings once out of bed to limit post-phlebitic edema. Will likely need long-term chronic anticoagulation. OK to transfer out of ICU.

## 2014-03-18 NOTE — Sedation Documentation (Signed)
Kriste BasqueBecky continues to hold manual pressure over leg popliteal dressing site.

## 2014-03-18 NOTE — Progress Notes (Signed)
Some improvement in symptoms.  Fibrinogen around 100 overnight.  Will try to complete lysis/thrombectomy today.  Coming down for follow up around 11:30-Noon.

## 2014-03-18 NOTE — Progress Notes (Signed)
ANTICOAGULATION CONSULT NOTE - Follow Up Consult  Pharmacy Consult for heparin Indication: DVT  Labs:  Recent Labs  03/16/14 0540 03/16/14 1355  03/17/14 0048 03/17/14 0720  03/17/14 1500 03/17/14 1612 03/17/14 2100 03/18/14 0030 03/18/14 0223  HGB 11.4*  --   < > 11.3*  --   --   --  10.2*  --  9.7* 10.2*  HCT 35.1*  --   < > 35.2*  --   --   --  30.5*  --  29.5* 31.2*  PLT 118*  --   < > 95*  --   --   --  80*  --  81* 79*  LABPROT  --   --   --   --  15.9*  --   --   --   --   --   --   INR  --   --   --   --  1.27  --   --   --   --   --   --   HEPARINUNFRC  --   --   --  0.86*  --   < > 0.86*  --  0.24*  --  0.18*  CREATININE 0.85  --   --  1.20  --   --   --   --   --   --   --   TROPONINI  --  <0.30  --   --   --   --   --   --   --   --   --   < > = values in this interval not displayed.   Assessment: 63yo male now slightly below goal on heparin after one level at goal, plan to return to IR this am.  Goal of Therapy:  Heparin level 0.2-0.5 units/ml   Plan:  Will increase heparin gtt by ~1 units/kg/hr to 1250 units/hr and check level in 6hr.  Vernard GamblesVeronda Sherese Heyward, PharmD, BCPS  03/18/2014,3:51 AM

## 2014-03-18 NOTE — Sedation Documentation (Signed)
Manual pressure stopped to rt leg, oozing observed again, pressure reapplied by Becky. Pt tolerating well.

## 2014-03-18 NOTE — Progress Notes (Addendum)
[facility] PROGRESS NOTE  Piedad ClimesDavid W Clauson NFA:[id number] DOB: 1951/02/28 DOA: 03/15/2014 PCP: Desmond DikeAMERON,JOHN, MD  Past 24 hours:  Admitted for back/groin pain & in workup, found to have extensive bilateral DVT throughout his lower extremity venous system. S/p thrombolysis 7/19 & moved to SICU.  Had episodes of bradycardia with heart rate in the 40s, asymptomatic. Improved the following day.  Assessment/Plan: Principal Problem: Bilateral acute lower extremity DVT: Quite extensive. Appreciate interventional radiology assistance. Monitor patient closely status post thrombolysis. Cause is unclear. Patient had a previous DVT status post IVC filter a number of years ago following back surgery with complications and prolonged immobilization. His only other family member who had a blood clot was a father who suffered 1 after a leg injury. Patient himself despite his back pain, can never sit still because of pain. He therefore is quite ambulatory and once a week is on a motorcycle for 6 hours, but stops at least every 2 hours. After this, suspect she will need lifelong anticoagulation. We'll discuss with interventional radiology when we can start this.  Bradycardia: Resolving. Suspect secondary to anesthesia post procedure plus resolution of pain plus narcotics plus patient's beta blocker, currently on hold  Acute renal failure: Resolved. Likely from dehydration and poor by mouth intake. Will gently hydrate  Leukocytosis: Likely secondary to clot burden plus high-dose steroids secondary to initial concerns for spinal stenosis. Tapering down steroids. White count slowly improving.  Thrombocytopenia: Patient started on heparin last night and platelets down to mid 90s from 118, was at 129 on admission except previous low platelets    Metabolic acidosis: Continues to improve. Workup negative. Patient states because of pain and nausea he has not eaten anything in over a week. Likely starvation  ketosis  Active Problems:   Essential hypertension, benign: Pressure stable. Held beta blockers secondary to bradycardia   Obesity: Patient is criteria with BMI of 36    CAD, NATIVE VESSEL, status post stent placement.    HYPERLIPIDEMIA-MIXED    Diastolic CHF, chronic: Patient states no further problems with congestive heart failure. Echocardiogram in 07 negative. No signs of acute heart failure. BNP normal    Intractable low back pain: Nothing acute on CT or hip films. Discuss with neurosurgery and they feel process is not related to his chronic back pain. Starting to hurt again, chronic. Likely get exacerbated from staying in bed    Microscopic hematuria: Unclear etiology. Recheck before discharge    Nausea with vomiting: Resolved. Suspect secondary to pain    Severe right groin pain: Resolved. Secondary to clot.    Acute epigastric pain: Resolved. Mistakenly refer to his chest pain. Suspect to be more indigestion from nausea and vomiting persistent. When necessary Maalox. Troponin and EKG are normal  Code Status: Full Family Communication: Spoke with multiple family members yesterday.  Disposition Plan: Continue inpatient. Need to look at cause plus long-term anticoagulation plan   Consultants:  Neurosurgery  Interventional radiology  Procedures: Status post thrombolysis done 7/18  Antibiotics:  None  Subjective: Patient doing okay. Some mild back pain, however he overall is markedly improved from yesterday   Objective: Filed Vitals:   03/18/14 0945  BP: 129/66  Pulse:   Temp:   Resp:     Intake/Output Summary (Last 24 hours) at 03/18/14 0950 Last data filed at 03/18/14 0800  Gross per 24 hour  Intake 4337.31 ml  Output   3900 ml  Net 437.31 ml   Filed Weights   03/14/14 2234  Weight:  117.935 kg (260 lb)    Exam:   General:  Minimal distress secondary to back pain, however much improved  Cardiovascular: Regular rate and rhythm, S1-S2,  borderline bradycardia  Respiratory: Clear to auscultation bilaterally  Abdomen: Soft, nontender, nondistended, positive bowel sounds  Musculoskeletal: No clubbing or cyanosis, trace edema  Data Reviewed: Basic Metabolic Panel:  Recent Labs Lab 03/15/14 0137 03/16/14 0540 03/17/14 0048 03/18/14 0223  NA 133* 134* 129* 135*  K 4.1 4.7 5.0 4.9  CL 92* 93* 90* 97  CO2 21 24 23 22   GLUCOSE 146* 154* 148* 139*  BUN 21 25* 45* 48*  CREATININE 0.81 0.85 1.20 0.97  CALCIUM 9.5 9.7 9.6 8.7   Liver Function Tests:  Recent Labs Lab 03/15/14 0137  AST 24  ALT 19  ALKPHOS 71  BILITOT 1.1  PROT 8.3  ALBUMIN 4.4   No results found for this basename: LIPASE, AMYLASE,  in the last 168 hours No results found for this basename: AMMONIA,  in the last 168 hours CBC:  Recent Labs Lab 03/15/14 0137  03/17/14 0048 03/17/14 1612 03/18/14 0030 03/18/14 0223 03/18/14 0528  WBC 12.6*  < > 19.4* 19.4* 17.3* 17.3* 16.0*  NEUTROABS 10.4*  --   --   --   --   --   --   HGB 12.2*  < > 11.3* 10.2* 9.7* 10.2* 9.8*  HCT 38.0*  < > 35.2* 30.5* 29.5* 31.2* 30.3*  MCV 90.7  < > 91.0 90.2 89.1 89.4 89.4  PLT 129*  < > 95* 80* 81* 79* 75*  < > = values in this interval not displayed. Cardiac Enzymes:  Recent Labs Lab 03/16/14 1355  TROPONINI <0.30      Scheduled Meds: . antiseptic oral rinse  15 mL Mouth Rinse BID  . dexamethasone  4 mg Intravenous 3 times per day  . fentaNYL  75 mcg Transdermal Q72H  . hydrochlorothiazide  25 mg Oral Daily  . isosorbide mononitrate  30 mg Oral Daily  . lisinopril  40 mg Oral Daily  . polyethylene glycol  17 g Oral Daily  . potassium chloride  10 mEq Oral BID  . senna-docusate  2 tablet Oral QHS  . simvastatin  40 mg Oral q1800  . sodium chloride 0.9 % 250 mL with alteplase (CATHFLO ACTIVASE) 12 mg infusion   Intravenous Once  . sodium chloride  3 mL Intravenous Q12H  . sodium chloride  3 mL Intravenous Q12H   Continuous Infusions: . sodium  chloride 75 mL/hr at 03/17/14 2017  . heparin 1,250 Units/hr (03/18/14 0617)  . tenecteplase (TNKase) infusion 0.25 mg/hr (03/18/14 0945)  . tenecteplase (TNKase) infusion 0.25 mg/hr (03/17/14 1504)     Time spent: 15 minutes    Hollice Espy  [facility] Pager [phone]. If 7PM-7AM, please contact night-coverage at [email or web address], password [facility] 03/18/2014, 9:50 AM  LOS: 3 days

## 2014-03-18 NOTE — Sedation Documentation (Addendum)
Heparin stopped temporarily to pull popliteal sheaths per Dr. Antonietta JewelYamagata's verbal order.  Stated to restart at same rate after pressure held, dressing applied, and no oozing noted from sites.

## 2014-03-18 NOTE — Progress Notes (Signed)
CRITICAL VALUE ALERT  Critical value received: Fibrinogen 96  Date of notification: 03/18/14  Time of notification: 0400  Critical value read back: yes  Nurse who received alert: Brayton CavesJessie, RN  MD notified (1st page): Dr. Fredia SorrowYamagata  Time of first page: 0430  Responding MD: Dr. Fredia SorrowYamagata  Time MD responded: [phone]

## 2014-03-18 NOTE — Progress Notes (Signed)
ANTICOAGULATION CONSULT NOTE - Follow Up Consult  Pharmacy Consult for heparin Indication: DVT  Labs:  Recent Labs  03/16/14 0540 03/16/14 1355  03/17/14 0048 03/17/14 0720  03/18/14 0223 03/18/14 0528 03/18/14 1411 03/18/14 2045  HGB 11.4*  --   < > 11.3*  --   < > 10.2* 9.8* 9.6* 9.8*  HCT 35.1*  --   < > 35.2*  --   < > 31.2* 30.3* 29.0* 30.0*  PLT 118*  --   < > 95*  --   < > 79* 75* 71* 74*  LABPROT  --   --   --   --  15.9*  --   --   --   --   --   INR  --   --   --   --  1.27  --   --   --   --   --   HEPARINUNFRC  --   --   --  0.86*  --   < > 0.18*  --  <0.10* 0.13*  CREATININE 0.85  --   --  1.20  --   --  0.97  --   --   --   TROPONINI  --  <0.30  --   --   --   --   --   --   --   --   < > = values in this interval not displayed.   Assessment: 63yo male with bilateral acute lower extremity DVT on heparin drip at 1250 units/hr. This afternoon, popliteal catheters were pulled while heparin was held. TNK stopped. Heparin drip was resumed afterwards. 6-hr HL subtherapeutic at 0.13. H/H remains stable. Plt low at 74. No bleeding noted.    Goal of Therapy:  Heparin level 0.3-0.5 units/ml   Plan:  Will increase heparin gtt to 1550 units/hr and check level in 6hr. Monitor daily HL, CBC, and s/s of bleeding   Vinnie LevelBenjamin Satsuki Zillmer, PharmD.  Clinical Pharmacist Pager [phone]

## 2014-03-18 NOTE — Sedation Documentation (Signed)
Heparin restarted at prior rate, 12.5cc/hr.  No oozing noted to posterior knees.

## 2014-03-19 LAB — CBC
HCT: 33.2 % — ABNORMAL LOW (ref 39.0–52.0)
HEMOGLOBIN: 10.7 g/dL — AB (ref 13.0–17.0)
MCH: 29.5 pg (ref 26.0–34.0)
MCHC: 32.2 g/dL (ref 30.0–36.0)
MCV: 91.5 fL (ref 78.0–100.0)
Platelets: 87 10*3/uL — ABNORMAL LOW (ref 150–400)
RBC: 3.63 MIL/uL — AB (ref 4.22–5.81)
RDW: 14.7 % (ref 11.5–15.5)
WBC: 12.2 10*3/uL — ABNORMAL HIGH (ref 4.0–10.5)

## 2014-03-19 LAB — HEPARIN LEVEL (UNFRACTIONATED): HEPARIN UNFRACTIONATED: 0.19 [IU]/mL — AB (ref 0.30–0.70)

## 2014-03-19 LAB — FIBRINOGEN: Fibrinogen: 99 mg/dL — CL (ref 204–475)

## 2014-03-19 LAB — PRO B NATRIURETIC PEPTIDE: Pro B Natriuretic peptide (BNP): 849.5 pg/mL — ABNORMAL HIGH (ref 0–125)

## 2014-03-19 MED ORDER — OXYCODONE-ACETAMINOPHEN 5-325 MG PO TABS
1.0000 | ORAL_TABLET | Freq: Four times a day (QID) | ORAL | Status: DC | PRN
  Administered 2014-03-19 – 2014-03-20 (×3): 1 via ORAL
  Filled 2014-03-19 (×3): qty 1

## 2014-03-19 MED ORDER — DEXAMETHASONE SODIUM PHOSPHATE 4 MG/ML IJ SOLN
1.0000 mg | Freq: Two times a day (BID) | INTRAMUSCULAR | Status: DC
  Administered 2014-03-19 (×2): 1 mg via INTRAVENOUS
  Filled 2014-03-19 (×5): qty 0.25

## 2014-03-19 MED ORDER — RIVAROXABAN 15 MG PO TABS
15.0000 mg | ORAL_TABLET | Freq: Two times a day (BID) | ORAL | Status: DC
  Administered 2014-03-19 – 2014-03-20 (×3): 15 mg via ORAL
  Filled 2014-03-19 (×6): qty 1

## 2014-03-19 MED ORDER — METHOCARBAMOL 500 MG PO TABS
500.0000 mg | ORAL_TABLET | Freq: Four times a day (QID) | ORAL | Status: DC | PRN
  Administered 2014-03-19: 500 mg via ORAL
  Filled 2014-03-19: qty 1

## 2014-03-19 MED ORDER — FUROSEMIDE 40 MG PO TABS
40.0000 mg | ORAL_TABLET | Freq: Two times a day (BID) | ORAL | Status: DC
  Administered 2014-03-19 – 2014-03-20 (×2): 40 mg via ORAL
  Filled 2014-03-19 (×4): qty 1

## 2014-03-19 MED ORDER — RIVAROXABAN 20 MG PO TABS
20.0000 mg | ORAL_TABLET | Freq: Every day | ORAL | Status: DC
  Filled 2014-03-19: qty 1

## 2014-03-19 NOTE — Progress Notes (Signed)
Heparin gtt turned off with first dose of Xarelto per pharmacist verbal instruction. Will continue to monitor. Franki CabotBrianna Yaiza Palazzola, RN

## 2014-03-19 NOTE — Progress Notes (Signed)
Attempted to call report to [id number] RN, was told she would call me back.  Will call back in 10min if I do not hear from her. Franki CabotBrianna Trino Higinbotham, RN

## 2014-03-19 NOTE — Progress Notes (Signed)
UR Completed.  [name] Jane [phone] 03/19/2014  

## 2014-03-19 NOTE — Progress Notes (Signed)
[facility] PROGRESS NOTE  [name] Agena ZOX:[id number] DOB: 12-29-1950 DOA: 03/15/2014 PCP: Desmond Dike, MD  History of present illness/Past 24 hours:  Admitted for back/groin pain & in workup, found to have extensive bilateral DVT throughout his lower extremity venous system. S/p thrombolysis 7/19.  Continue to have episodes of decreasing platelet count. HIT panel ordered. Otherwise no complaints and pain better controlled cleared by interventional radiologist to start anticoagulation today.   Assessment/Plan: Principal Problem: Bilateral acute lower extremity DVT: Quite extensive. Appreciate interventional radiology assistance. Cause is unclear. Patient had a previous DVT status post IVC filter a number of years ago following back surgery with complications and prolonged immobilization. Status post thrombolysis. Start Xarelto today.   Bradycardia: Resolving. Suspect secondary to anesthesia post procedure plus resolution of pain plus narcotics plus patient's beta blocker, currently on hold  Acute renal failure: Resolved. Likely from dehydration and poor by mouth intake. Will gently hydrate  Leukocytosis: Likely secondary to clot burden plus high-dose steroids secondary to initial concerns for spinal stenosis. Tapering down steroids. White count  continues to improve  Thrombocytopenia: Patient started on heparin last night and platelets down to mid 90s from 118, was at 129 on admission except previous low platelets, HIT panel pending    Metabolic acidosis: Continues to improve. Workup negative. Patient states because of pain and nausea he has not eaten anything in over a week. Likely starvation ketosis  Active Problems:   Essential hypertension, benign: Pressure stable. Held beta blockers secondary to bradycardia   Obesity: Patient is criteria with BMI of 36    CAD, NATIVE VESSEL, status post stent placement.    HYPERLIPIDEMIA-MIXED    Diastolic CHF, chronic: Patient states  no further problems with congestive heart failure. Echocardiogram in 07 negative. No signs of acute heart failure. BNP normal    Intractable low back pain: Nothing acute on CT or hip films. Patient seems to have better control extended release oxy every 12 hours    Microscopic hematuria: Unclear etiology. Recheck before discharge    Nausea with vomiting: Resolved. Suspect secondary to pain    Severe right groin pain: Resolved. Secondary to clot.    Acute epigastric pain: Resolved. Mistakenly refer to his chest pain. Suspect to be more indigestion from nausea and vomiting persistent. When necessary Maalox. Troponin and EKG are normal  Code Status: Full Family Communication: Spoke with multiple family members yesterday.  Disposition Plan:  transfer to telemetry bed. Have started Xarelto. Ambulate, DC Foley. Likely discharge tomorrow   Consultants:  Neurosurgery  Interventional radiology  Procedures: Status post thrombolysis done 7/18  Antibiotics:  None  Subjective:  patient feeling much better. Minimal pain, even in his back.   Objective: Filed Vitals:   03/19/14 0800  BP: 109/57  Pulse: 59  Temp:   Resp:     Intake/Output Summary (Last 24 hours) at 03/19/14 0856 Last data filed at 03/19/14 0800  Gross per 24 hour  Intake 2880.75 ml  Output   4175 ml  Net -1294.25 ml   Filed Weights   03/14/14 2234  Weight: 117.935 kg (260 lb)    Exam:   General:   much improved, alert and oriented x3   Cardiovascular: Regular rate and rhythm, S1-S2, borderline bradycardia  Respiratory: Clear to auscultation bilaterally  Abdomen: Soft, nontender, nondistended, positive bowel sounds  Musculoskeletal: No clubbing or cyanosis, trace edema  Data Reviewed: Basic Metabolic Panel:  Recent Labs Lab 03/15/14 0137 03/16/14 0540 03/17/14 0048 03/18/14 0223  NA 133* 134*  129* 135*  K 4.1 4.7 5.0 4.9  CL 92* 93* 90* 97  CO2 21 24 23 22   GLUCOSE 146* 154* 148* 139*   BUN 21 25* 45* 48*  CREATININE 0.81 0.85 1.20 0.97  CALCIUM 9.5 9.7 9.6 8.7   Liver Function Tests:  Recent Labs Lab 03/15/14 0137  AST 24  ALT 19  ALKPHOS 71  BILITOT 1.1  PROT 8.3  ALBUMIN 4.4   No results found for this basename: LIPASE, AMYLASE,  in the last 168 hours No results found for this basename: AMMONIA,  in the last 168 hours CBC:  Recent Labs Lab 03/15/14 0137  03/18/14 0030 03/18/14 0223 03/18/14 0528 03/18/14 1411 03/18/14 2045  WBC 12.6*  < > 17.3* 17.3* 16.0* 13.1* 12.9*  NEUTROABS 10.4*  --   --   --   --   --   --   HGB 12.2*  < > 9.7* 10.2* 9.8* 9.6* 9.8*  HCT 38.0*  < > 29.5* 31.2* 30.3* 29.0* 30.0*  MCV 90.7  < > 89.1 89.4 89.4 89.5 89.3  PLT 129*  < > 81* 79* 75* 71* 74*  < > = values in this interval not displayed. Cardiac Enzymes:  Recent Labs Lab 03/16/14 1355  TROPONINI <0.30      Scheduled Meds: . antiseptic oral rinse  15 mL Mouth Rinse BID  . dexamethasone  1 mg Intravenous Q12H  . fentaNYL  75 mcg Transdermal Q72H  . hydrochlorothiazide  25 mg Oral Daily  . isosorbide mononitrate  30 mg Oral Daily  . lisinopril  40 mg Oral Daily  . OxyCODONE  10 mg Oral Q12H  . polyethylene glycol  17 g Oral Daily  . potassium chloride  10 mEq Oral BID  . senna-docusate  2 tablet Oral QHS  . simvastatin  40 mg Oral q1800  . sodium chloride 0.9 % 250 mL with alteplase (CATHFLO ACTIVASE) 12 mg infusion   Intravenous Once  . sodium chloride  3 mL Intravenous Q12H  . sodium chloride  3 mL Intravenous Q12H   Continuous Infusions: . heparin 1,700 Units/hr (03/19/14 0700)     Time spent: 15 minutes    Hollice EspyKRISHNAN,SENDIL K  [facility] Pager [phone]. If 7PM-7AM, please contact night-coverage at [email or web address], password [email or web address] 03/19/2014, 8:56 AM  LOS: 4 days

## 2014-03-19 NOTE — Care Management Note (Addendum)
    Page 1 of 1   03/19/2014     2:34:33 PM CARE MANAGEMENT NOTE 03/19/2014  Patient:  [name],[name]   Account Number:  [id number]  Date Initiated:  03/19/2014  Documentation initiated by:  [facility]ARAH  Subjective/Objective Assessment:   Admitted with extensive DVT dispite IVCfilter. Thombolitic - now on heparin.     Action/Plan:   Anticipated DC Date:  03/22/2014   Anticipated DC Plan:  HOME/SELF CARE      DC Planning Services  CM consult  Medication Assistance      Choice offered to / List presented to:             Status of service:  In process, will continue to follow Medicare Important Message given?  YES (If response is "NO", the following Medicare IM given date fields will be blank) Date Medicare IM given:  03/19/2014 Medicare IM given by:  [facility]Arlis Everly Date Additional Medicare IM given:   Additional Medicare IM given by:    Discharge Disposition:    Per UR Regulation:  Reviewed for med. necessity/level of care/duration of stay  If discussed at Long Length of Stay Meetings, dates discussed:    Comments:  Contact:  Mcginniss,Wilma Spouse     [phone]  03/19/14 1200 Oletta Cohnamellia Jamison Yuhasz, RN, BSN, [place] [phone] Spoke with pt at bedside regarding benefits check for Xarelto.  Pt has brochure with 30 day free card and refill assistance card intact.  [facility].  NCM called pharmacy to confirm availability of medication.  Information relayed to pt.  Pt verbalizes importance of filling medication upon discharge.  03-19-14 8:23am Avie ArenasSarah Brown, RNBSN [phone]- [phone] Continues on heparin - INR subtherapeutic.

## 2014-03-19 NOTE — Progress Notes (Addendum)
Arrived to floor at 1040 from 2S.  A&0x4.  Jacki ConesLaurie, RN CN and Arna MediciNora NT at bedside providing assist.  Introduced self to pt and informed him am primary nurse.  Pt verbalized understanding.  Call bell at reach.  Will continue to monitor.  Amanda PeaNellie Reinhard Schack, Charity fundraiserN.

## 2014-03-19 NOTE — Progress Notes (Addendum)
ANTICOAGULATION CONSULT NOTE - Follow Up Consult  Pharmacy Consult for Heparin --> Xarelto Indication: DVT  No Known Allergies  Patient Measurements: Height: 5\' 11"  (180.3 cm) Weight: 260 lb (117.935 kg) IBW/kg (Calculated) : 75.3  Vital Signs: Temp: 98 F (36.7 C) (07/20 0756) Temp src: Oral (07/20 0756) BP: 109/57 mmHg (07/20 0800) Pulse Rate: 59 (07/20 0800)  Labs:  Recent Labs  03/16/14 1355  03/17/14 0048 03/17/14 0720  03/18/14 0223 03/18/14 0528 03/18/14 1411 03/18/14 2045 03/19/14 0230  HGB  --   < > 11.3*  --   < > 10.2* 9.8* 9.6* 9.8*  --   HCT  --   < > 35.2*  --   < > 31.2* 30.3* 29.0* 30.0*  --   PLT  --   < > 95*  --   < > 79* 75* 71* 74*  --   LABPROT  --   --   --  15.9*  --   --   --   --   --   --   INR  --   --   --  1.27  --   --   --   --   --   --   HEPARINUNFRC  --   --  0.86*  --   < > 0.18*  --  <0.10* 0.13* 0.19*  CREATININE  --   --  1.20  --   --  0.97  --   --   --   --   TROPONINI <0.30  --   --   --   --   --   --   --   --   --   < > = values in this interval not displayed.  Estimated Creatinine Clearance: 103.1 ml/min (by C-G formula based on Cr of 0.97).  Assessment:   S/p thrombolysis/thrombectomy 7/18-7/19 for extensive DVT.  Has been on IV heparin with subtherapeutic levels last night and this am.  Now to change to Xarelto.  Groin oozing noted with sheath pull 7/19 am, but resolved.  Patient reports no bleeding.  Thrombocytopenia.  Low baseline platelet count (129) on admit, 74K on 7/19.  HIT panel sent 7/19 (pending).  We discussed Xarelto dosing and bleeding precautions.  He reports taking Coumadin in the past, but difficulty in achieving target INRs. He usually rides his motorocycle on Sundays with friends.  Goal of Therapy:  full-anticoagulation with Xarelto Monitor platelets by anticoagulation protocol: Yes   Plan:   Stop heparin drip.  Begin Xarelto 15 mg BID when stopping IV heparin.  Continue Xarelto 15 mg BID x 21  days (thru 04/08/14), then begin 20 mg daily with supper on 04/09/14.  Monitor for any bleeding.  Follow up HIT panel, CBC, platelet counts.   Dennie FettersEgan, Saige Busby Donovan, RPh Pager: [phone] 03/19/2014,9:36 AM

## 2014-03-19 NOTE — Progress Notes (Signed)
Pt requesting for pain med. Notify Dr. Chancy MilroyS. krishnan for order of pain med.  Will continue to monitor.  Amanda PeaNellie Othell Jaime, Charity fundraiserN.

## 2014-03-19 NOTE — Progress Notes (Signed)
ANTICOAGULATION CONSULT NOTE - Follow Up Consult  Pharmacy Consult for heparin Indication: DVT  Labs:  Recent Labs  03/16/14 0540 03/16/14 1355  03/17/14 0048 03/17/14 0720  03/18/14 0223 03/18/14 0528 03/18/14 1411 03/18/14 2045 03/19/14 0230  HGB 11.4*  --   < > 11.3*  --   < > 10.2* 9.8* 9.6* 9.8*  --   HCT 35.1*  --   < > 35.2*  --   < > 31.2* 30.3* 29.0* 30.0*  --   PLT 118*  --   < > 95*  --   < > 79* 75* 71* 74*  --   LABPROT  --   --   --   --  15.9*  --   --   --   --   --   --   INR  --   --   --   --  1.27  --   --   --   --   --   --   HEPARINUNFRC  --   --   --  0.86*  --   < > 0.18*  --  <0.10* 0.13* 0.19*  CREATININE 0.85  --   --  1.20  --   --  0.97  --   --   --   --   TROPONINI  --  <0.30  --   --   --   --   --   --   --   --   --   < > = values in this interval not displayed.   Assessment: 63yo male remains subtherapeutic on heparin despite rate adjustment; TNKase now stopped, HIT panel IP (Plt 74<--129), plt low but stable.  Goal of Therapy:  Heparin level 0.3-0.5 units/ml   Plan:  Will increase heparin gtt by ~1 unit/kg/hr to 1700 units/hr and check level in 6hr.  Vernard GamblesVeronda Erla Bacchi, PharmD, BCPS  03/19/2014,4:49 AM

## 2014-03-19 NOTE — Discharge Instructions (Signed)
Information on my medicine - XARELTO (rivaroxaban)  This medication education was reviewed with me or my healthcare representative as part of my discharge preparation.  The pharmacist that spoke with me during my hospital stay was:  Dennie Fettersgan, Marsena Taff Donovan, [facility]  WHY WAS Carlena HurlXARELTO PRESCRIBED FOR YOU? Xarelto was prescribed to treat blood clots that may have been found in the veins of your legs (deep vein thrombosis) and to reduce the risk of them occurring again.  What do you need to know about Xarelto? The starting dose is one 15 mg tablet taken TWICE daily with food for the FIRST 21 DAYS then on (enter date)  8/10./15  the dose is changed to one 20 mg tablet taken ONCE A DAY with your evening meal.  DO NOT stop taking Xarelto without talking to the health care provider who prescribed the medication.  Refill your prescription for 20 mg tablets before you run out.  After discharge, you should have regular check-up appointments with your healthcare provider that is prescribing your Xarelto.  In the future your dose may need to be changed if your kidney function changes by a significant amount.  What do you do if you miss a dose? If you are taking Xarelto TWICE DAILY and you miss a dose, take it as soon as you remember. You may take two 15 mg tablets (total 30 mg) at the same time then resume your regularly scheduled 15 mg twice daily the next day.  If you are taking Xarelto ONCE DAILY and you miss a dose, take it as soon as you remember on the same day then continue your regularly scheduled once daily regimen the next day. Do not take two doses of Xarelto at the same time.   Important Safety Information Xarelto is a blood thinner medicine that can cause bleeding. You should call your healthcare provider right away if you experience any of the following:   Bleeding from an injury or your nose that does not stop.   Unusual colored urine (red or dark brown) or unusual colored stools (red or  black).   Unusual bruising for unknown reasons.   A serious fall or if you hit your head (even if there is no bleeding).  Some medicines may interact with Xarelto and might increase your risk of bleeding while on Xarelto. To help avoid this, consult your healthcare provider or pharmacist prior to using any new prescription or non-prescription medications, including herbals, vitamins, non-steroidal anti-inflammatory drugs (NSAIDs) and supplements.  This website has more information on Xarelto: [email or web address].

## 2014-03-19 NOTE — Progress Notes (Signed)
Subjective: Extensive B lower extr dvt To include IVC into existing IVC filter Thrombolysis 7/18 and 7/19 Has done well Up in chair Off Hep now - Xarelto po Pain minimal at this time   Objective: Vital signs in last 24 hours: Temp:  [98 F (36.7 C)-98.7 F (37.1 C)] 98 F (36.7 C) (07/20 1153) Pulse Rate:  [44-97] 79 (07/20 1153) Resp:  [13-23] 18 (07/20 1153) BP: (100-177)/(49-83) 125/67 mmHg (07/20 1153) SpO2:  [93 %-99 %] 97 % (07/20 1153) Weight:  [115.8 kg (255 lb 4.7 oz)] 115.8 kg (255 lb 4.7 oz) (07/20 0950) Last BM Date: 03/14/14  Intake/Output from previous day: 07/19 0701 - 07/20 0700 In: 2956.3 [P.O.:420; I.V.:2286.3; IV Piggyback:250] Out: 4275 [Urine:4275] Intake/Output this shift: Total I/O In: 481.8 [P.O.:360; I.V.:121.8] Out: 575 [Urine:575]  PE:  Afeb; vss plt 87 today- up from 74 Up in chir Pleasant B legs still some edema NT ; soft Sites of lysis; clean and dry; no hematoma 2+ pulses B   Lab Results:   Recent Labs  03/18/14 2045 03/19/14 0934  WBC 12.9* 12.2*  HGB 9.8* 10.7*  HCT 30.0* 33.2*  PLT 74* 87*   BMET  Recent Labs  03/17/14 0048 03/18/14 0223  NA 129* 135*  K 5.0 4.9  CL 90* 97  CO2 23 22  GLUCOSE 148* 139*  BUN 45* 48*  CREATININE 1.20 0.97  CALCIUM 9.6 8.7   PT/INR  Recent Labs  03/17/14 0720  LABPROT 15.9*  INR 1.27   ABG No results found for this basename: PHART, PCO2, PO2, HCO3,  in the last 72 hours  Studies/Results: Ir Thrombect Veno Mech Mod Sed  03/18/2014   CLINICAL DATA:  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 The patient is 24 hr post initiation of bilateral transcatheter thrombolytic therapy.  EXAM: IR THROMB F/U EVAL ART/VEN FINAL DAY; THROMBOECTOMY MECHANICAL VENOUS  1. FOLLOW-UP VENOGRAPHY OF BILATERAL LOWER EXTREMITIES AND IVC DURING THROMBOLYTIC THERAPY, FINAL DAY. 2. MECHANICAL THROMBECTOMY OF THE LEFT ILIAC VEINS AND IVC. 3. MECHANICAL THROMBECTOMY OF THE RIGHT ILIAC  VEINS AND IVC. 4.  MEDICATIONS: Sedation:  4 mg IV Versed sec, 100 mcg IV fentanyl.  Total Moderate Sedation Time:  30 minutes.  CONTRAST:  OMNIPAQUE IOHEXOL 300 MG/ML  SOLN  FLUOROSCOPY TIME:  4 min and 12 seconds.  PROCEDURE: The patient was placed in a prone position and pre-existing popliteal venous sheaths and infusion catheter systems were prepped with Betadine.  Venography was initially performed of the left lower extremity with injection of the indwelling infusion catheter followed by injection of the popliteal venous sheath. Imaging also included the lower segment of the IVC and the IVC filter.  Venography was then performed of the right lower extremity with injection of the indwelling infusion catheter followed by injection of the popliteal venous sheath. Imaging also included the lower IVC.  The left venous infusion catheter was removed over a wire. Mechanical thrombectomy was performed at the level of the left external iliac vein, common iliac vein and the inferior vena cava with the Angiojet device catheter advanced over a guidewire. Venography was then performed after removal of the Angiojet catheter via a 5 French sheath positioned in the common femoral vein. Additional venography was also performed of the entire inferior vena cava with the catheter advanced into the lower IVC.  The right infusion catheter was then removed over a wire and mechanical thrombectomy performed at the level of the right common iliac vein and IVC with  the Angiojet device catheter. The thrombectomy catheter was then removed and a 5 French sheath advanced into the external iliac vein. Additional venography was performed of the external and common iliac veins as well as the lower IVC.  Following the procedure, catheters and sheaths were removed and hemostasis obtained with manual compression and use of V-pads.  COMPLICATIONS: None  FINDINGS: Venography of the left lower extremity shows marked interval lysis of iliofemoral  DVT with only a mild amount of nonocclusive thrombus identified in the external and common iliac veins. No thrombus is identified in the femoral vein of the thigh or the popliteal vein.  Initial venography also shows excellent flow through the IVC and indwelling IVC filter. Right lower extremity venography shows marked improvement with near complete lysis of thrombus. A small amount of nonocclusive thrombus remains in the common iliac vein.  Bilateral extremity venography also demonstrated some nonocclusive thrombus in the superior and inferior aspect of the IVC filter.  Additional mechanical thrombectomy was performed at the level of bilateral iliac veins and the lower IVC including at the level of the IVC filter. Completion venography shows no further evidence of any significant iliac vein thrombus. On completion, there is excellent flow through the inferior vena cava and IVC filter. A small amount of adherent nonocclusive thrombus is seen at both the superior tip and inferior aspect of the IVC filter.  Thrombolytic therapy was discontinued. IV heparin will be continued.  IMPRESSION: Dramatic response to therapy with essentially near complete resolution of bilateral iliofemoral DVT and IVC thrombus following 24 hr of catheter directed thrombolytic therapy and additional assistance with mechanical thrombectomy today. 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 Thrombolytic therapy will be discontinued. The patient will remain on bedrest for 4 hr following popliteal sheath removal.   Electronically Signed   By: Irish Lack M.D.   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 The patient is 24 hr post initiation of bilateral transcatheter thrombolytic therapy.  EXAM: IR THROMB F/U EVAL ART/VEN FINAL DAY; THROMBOECTOMY MECHANICAL VENOUS  1. FOLLOW-UP VENOGRAPHY OF  BILATERAL LOWER EXTREMITIES AND IVC DURING THROMBOLYTIC THERAPY, FINAL DAY. 2. MECHANICAL THROMBECTOMY OF THE LEFT ILIAC VEINS AND IVC. 3. MECHANICAL THROMBECTOMY OF THE RIGHT ILIAC VEINS AND IVC. 4.  MEDICATIONS: Sedation:  4 mg IV Versed sec, 100 mcg IV fentanyl.  Total Moderate Sedation Time:  30 minutes.  CONTRAST:  OMNIPAQUE IOHEXOL 300 MG/ML  SOLN  FLUOROSCOPY TIME:  4 min and 12 seconds.  PROCEDURE: The patient was placed in a prone position and pre-existing popliteal venous sheaths and infusion catheter systems were prepped with Betadine.  Venography was initially performed of the left lower extremity with injection of the indwelling infusion catheter followed by injection of the popliteal venous sheath. Imaging also included the lower segment of the IVC and the IVC filter.  Venography was then performed of the right lower extremity with injection of the indwelling infusion catheter followed by injection of the popliteal venous sheath. Imaging also included the lower IVC.  The left venous infusion catheter was removed over a wire. Mechanical thrombectomy was performed at the level of the left external iliac vein, common iliac vein and the inferior vena cava with the Angiojet device catheter advanced over a guidewire. Venography was then performed after removal of the Angiojet catheter via a 5 [place]  sheath positioned in the common femoral vein. Additional venography was also performed of the entire inferior vena cava with the catheter advanced into the lower IVC.  The right infusion catheter was then removed over a wire and mechanical thrombectomy performed at the level of the right common iliac vein and IVC with the Angiojet device catheter. The thrombectomy catheter was then removed and a 5 French sheath advanced into the external iliac vein. Additional venography was performed of the external and common iliac veins as well as the lower IVC.  Following the procedure, catheters and sheaths were  removed and hemostasis obtained with manual compression and use of V-pads.  COMPLICATIONS: None  FINDINGS: Venography of the left lower extremity shows marked interval lysis of iliofemoral DVT with only a mild amount of nonocclusive thrombus identified in the external and common iliac veins. No thrombus is identified in the femoral vein of the thigh or the popliteal vein.  Initial venography also shows excellent flow through the IVC and indwelling IVC filter. Right lower extremity venography shows marked improvement with near complete lysis of thrombus. A small amount of nonocclusive thrombus remains in the common iliac vein.  Bilateral extremity venography also demonstrated some nonocclusive thrombus in the superior and inferior aspect of the IVC filter.  Additional mechanical thrombectomy was performed at the level of bilateral iliac veins and the lower IVC including at the level of the IVC filter. Completion venography shows no further evidence of any significant iliac vein thrombus. On completion, there is excellent flow through the inferior vena cava and IVC filter. A small amount of adherent nonocclusive thrombus is seen at both the superior tip and inferior aspect of the IVC filter.  Thrombolytic therapy was discontinued. IV heparin will be continued.  IMPRESSION: Dramatic response to therapy with essentially near complete resolution of bilateral iliofemoral DVT and IVC thrombus following 24 hr of catheter directed thrombolytic therapy and additional assistance with mechanical thrombectomy today. 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 Thrombolytic therapy will be discontinued. The patient will remain on bedrest for 4 hr following popliteal sheath removal.   Electronically Signed   By: Irish Lack M.D.   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  The patient is 24 hr post initiation of bilateral transcatheter thrombolytic therapy.  EXAM: IR THROMB F/U EVAL ART/VEN FINAL DAY; THROMBOECTOMY MECHANICAL VENOUS  1. FOLLOW-UP VENOGRAPHY OF BILATERAL LOWER EXTREMITIES AND IVC DURING THROMBOLYTIC THERAPY, FINAL DAY. 2. MECHANICAL THROMBECTOMY OF THE LEFT ILIAC VEINS AND IVC. 3. MECHANICAL THROMBECTOMY OF THE RIGHT ILIAC VEINS AND IVC. 4.  MEDICATIONS: Sedation:  4 mg IV Versed sec, 100 mcg IV fentanyl.  Total Moderate Sedation Time:  30 minutes.  CONTRAST:  OMNIPAQUE IOHEXOL 300 MG/ML  SOLN  FLUOROSCOPY TIME:  4 min and 12 seconds.  PROCEDURE: The patient was placed in a prone position and pre-existing popliteal venous sheaths and infusion catheter systems were prepped with Betadine.  Venography was initially performed of the left lower extremity with injection of the indwelling infusion catheter followed by injection of the popliteal venous sheath. Imaging also included the lower segment of the IVC and the IVC filter.  Venography was then performed of the right lower extremity with injection of the indwelling infusion catheter followed by injection of the popliteal venous sheath. Imaging also included the lower IVC.  The left venous infusion catheter was removed over a wire. Mechanical thrombectomy was performed at the level of the left external iliac vein, common iliac vein and the inferior vena cava with the Angiojet device catheter advanced over a guidewire. Venography was then performed after removal of the Angiojet catheter via a 5 French sheath positioned in the common femoral vein. Additional venography was also performed of the entire inferior vena cava with the catheter advanced into the lower IVC.  The right infusion catheter was then removed over a wire and mechanical thrombectomy performed at the level of the right common iliac vein and IVC with the Angiojet device catheter. The thrombectomy catheter was then removed and a 5 French sheath advanced  into the external iliac vein. Additional venography was performed of the external and common iliac veins as well as the lower IVC.  Following the procedure, catheters and sheaths were removed and hemostasis obtained with manual compression and use of V-pads.  COMPLICATIONS: None  FINDINGS: Venography of the left lower extremity shows marked interval lysis of iliofemoral DVT with only a mild amount of nonocclusive thrombus identified in the external and common iliac veins. No thrombus is identified in the femoral vein of the thigh or the popliteal vein.  Initial venography also shows excellent flow through the IVC and indwelling IVC filter. Right lower extremity venography shows marked improvement with near complete lysis of thrombus. A small amount of nonocclusive thrombus remains in the common iliac vein.  Bilateral extremity venography also demonstrated some nonocclusive thrombus in the superior and inferior aspect of the IVC filter.  Additional mechanical thrombectomy was performed at the level of bilateral iliac veins and the lower IVC including at the level of the IVC filter. Completion venography shows no further evidence of any significant iliac vein thrombus. On completion, there is excellent flow through the inferior vena cava and IVC filter. A small amount of adherent nonocclusive thrombus is seen at both the superior tip and inferior aspect of the IVC filter.  Thrombolytic therapy was discontinued. IV heparin will be continued.  IMPRESSION: Dramatic response to therapy with essentially near complete resolution of bilateral iliofemoral DVT and IVC thrombus following 24 hr of catheter directed thrombolytic therapy and additional assistance with mechanical thrombectomy today. 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 Thrombolytic therapy will be discontinued. The patient will remain on bedrest for 4 hr following popliteal sheath removal.   Electronically  Signed   By: Irish LackGlenn  Yamagata M.D.   On: 03/18/2014 15:01    Anti-infectives: Anti-infectives   None      Assessment/Plan: s/p * No surgery found *  Thrombolysis extensive B LE dvt including IVC 7/18-19 in IR Pt has done well Plan for home in am per pt Now on Xarelto po  Recommend: thigh high compression stockings upon dc for at home use Wear x few months Per Dr Fredia SorrowYamagata    LOS: 4 days    Lenord Fralix A 03/19/2014

## 2014-03-20 DIAGNOSIS — E669 Obesity, unspecified: Secondary | ICD-10-CM

## 2014-03-20 LAB — BASIC METABOLIC PANEL
Anion gap: 15 (ref 5–15)
BUN: 22 mg/dL (ref 6–23)
CALCIUM: 8.8 mg/dL (ref 8.4–10.5)
CO2: 22 meq/L (ref 19–32)
Chloride: 97 mEq/L (ref 96–112)
Creatinine, Ser: 0.58 mg/dL (ref 0.50–1.35)
GFR calc Af Amer: >90 mL/min (ref >90)
GLUCOSE: 105 mg/dL — AB (ref 70–99)
Potassium: 4.5 mEq/L (ref 3.7–5.3)
Sodium: 134 mEq/L — ABNORMAL LOW (ref 137–147)

## 2014-03-20 LAB — HEPARIN INDUCED THROMBOCYTOPENIA PNL
HEPARIN INDUCED PLT AB: NEGATIVE
Patient O.D.: 0.03
UFH High Dose UFH H: 0 % Release
UFH LOW DOSE 0.1 IU/ML: 1 %
UFH Low Dose 0.5 IU/mL: 3 % Release
UFH SRA Result: NEGATIVE

## 2014-03-20 LAB — CBC
HEMATOCRIT: 31.8 % — AB (ref 39.0–52.0)
HEMOGLOBIN: 10.5 g/dL — AB (ref 13.0–17.0)
MCH: 29.8 pg (ref 26.0–34.0)
MCHC: 33 g/dL (ref 30.0–36.0)
MCV: 90.3 fL (ref 78.0–100.0)
Platelets: 94 10*3/uL — ABNORMAL LOW (ref 150–400)
RBC: 3.52 MIL/uL — AB (ref 4.22–5.81)
RDW: 14.5 % (ref 11.5–15.5)
WBC: 12.3 10*3/uL — ABNORMAL HIGH (ref 4.0–10.5)

## 2014-03-20 MED ORDER — DEXAMETHASONE 0.5 MG PO TABS
1.0000 mg | ORAL_TABLET | Freq: Two times a day (BID) | ORAL | Status: DC
  Administered 2014-03-20: 1 mg via ORAL
  Filled 2014-03-20 (×2): qty 2

## 2014-03-20 MED ORDER — RIVAROXABAN 15 MG PO TABS
15.0000 mg | ORAL_TABLET | Freq: Two times a day (BID) | ORAL | Status: DC
Start: 2014-03-20 — End: 2014-03-20

## 2014-03-20 MED ORDER — RIVAROXABAN 20 MG PO TABS
20.0000 mg | ORAL_TABLET | Freq: Every day | ORAL | Status: DC
Start: 2014-04-09 — End: 2014-03-20

## 2014-03-20 MED ORDER — RIVAROXABAN 20 MG PO TABS: 20.0000 mg | ORAL_TABLET | Freq: Every day | ORAL | Status: AC

## 2014-03-20 MED ORDER — OXYCODONE HCL ER 10 MG PO T12A: 10.0000 mg | EXTENDED_RELEASE_TABLET | Freq: Two times a day (BID) | ORAL | Status: AC

## 2014-03-20 MED ORDER — RIVAROXABAN 15 MG PO TABS: 15.0000 mg | ORAL_TABLET | Freq: Two times a day (BID) | ORAL | Status: AC

## 2014-03-20 NOTE — Progress Notes (Signed)
Patient having c/o back and hip pain requesting additional pain medication. MD on call notified, received orders for PO Robaxin and Percocet PRN. Will administer medications as ordered and continue to monitor. [name], [name]

## 2014-03-21 NOTE — Discharge Summary (Signed)
Discharge Summary  [name] WUJ:[id number] DOB: 08-23-1951 DOA: 03/15/2014  PCP: Desmond Dike, MD  Admit date: 03/15/2014 Discharge date: 03/21/2014  Time spent: 35 minutes  Recommendations for Outpatient Follow-up:  1. New medication: Xarelto 15 mg twice a day for the next 20 days then changed to 20 mg daily 2. Recommended medication adjustment: Patient has severe chronic back pain and takes Percocet 5/325 six times a day as needed, although he tells me he pretty much takes this every day 6 times. Would recommend that his PCP consider changing this to OxyIR 6 times a day as needed to decrease the amount of Tylenol liver toxicity. 3. Patient also suffers with chronic back pain and given his frequency of when necessary pain medication, I gave him a prescription for OxyContin ER 10 mg every 12 hours. He said there may be a medication cost of 40 bili the issue. Long-term for pain management, this would be the patient's best interests. His medication was trialed during his hospitalization. 4. Patient will follow up with his PCP in the next month. 5. Patient will followup in 4 weeks with Dr. Fredia Sorrow, interventional radiology. They will call him to schedule his appointment. 6. Patient is advised to wear TED hose for the next few months 7. Advised patient's PCP to work him up for chronic liver disease. See below. 8. Patient's Coreg is being put on hold secondary to bradycardia. Advised the patient had his pulse checked once a week and restart his Coreg once his heart rate is above 70. He can also follow up with his PCP to formally restart this.  Discharge Diagnoses:  Active Hospital Problems   Diagnosis Date Noted  . Acute deep vein thrombosis (DVT) of other specified vein of both lower extremities 03/17/2014  . Bradycardia 03/18/2014  . Metabolic acidosis 03/16/2014  . Obesity (BMI 30-39.9) 03/16/2014  . Intractable low back pain 03/15/2014  . Microscopic hematuria 03/15/2014  . Nausea  with vomiting 03/15/2014  . Severe right groin pain 03/15/2014  . Diastolic CHF, chronic 10/15/2011  . HYPERLIPIDEMIA-MIXED 09/04/2010  . CAD, NATIVE VESSEL 02/11/2009  . Essential hypertension, benign 02/11/2009    Resolved Hospital Problems   Diagnosis Date Noted Date Resolved  . Acute epigastric pain 03/16/2014 03/17/2014    Discharge Condition: Improved, being discharged home  Diet recommendation: Heart healthy  Filed Weights   03/14/14 2234 03/19/14 0950 03/20/14 0525  Weight: 117.935 kg (260 lb) 115.8 kg (255 lb 4.7 oz) 110.904 kg (244 lb 8 oz)    History of present illness:  Patient is a 63 year old white male past medical history of chronic low back pain status post laminectomy and spinal cord stimulator placement plus diastolic heart failure who presented with complaints of severe low back pain x2 days. He was noted to the hospitalist service on 7/16.  Hospital Course:  Principal Problem:   Acute deep vein thrombosis (DVT) of other specified vein of both lower extremities: Patient's acute back pain was ruled out by neurosurgery in relation to his chronic back pain issues. By hospital day 2, the pain had migrated to the right groin and appeared to be quite tender. A CT scan of the abdomen was done which actually revealed enlargement of IVC concerning for extensive thrombus. Lower extremity Dopplers confirmed bilateral lower extremity DVT involving most of the lower venous system.  Patient had a previous thrombus following complicated back surgery a number of years ago. At that time an IVC filter was placed. Interventional radiology was consulted. A venogram  was done which confirmed patient's thrombosis and IVC extension. Patient was taken for thrombolysis on 7/18. In phone procedure monitored in the step down unit. Patient had great success with followup evaluation noting near resolution of almost all of his thrombus. He was cleared to start Xarelto on 7/20. Patient will need  lifelong anticoagulation. He's advised by intervention radiology to wear TED hose for the next few months. He will followup with interventional radiology in 4 weeks.  As far as etiology of thrombus, it is unclear. Patient has a dad who had a blood clot, but this was after he reported mining accident and leg injury. Patient's previous thrombus was felt to be secondary to prolonged immobilization after surgery. In discussion with patient, is actually quite active because he cannot sit still because of his back pain. He does ride a motorcycle once a week for 6 hours, but states that he stops at least once every 2 hours to stretch his legs and urinate. I do not think that this was cause of clot. Because of the emergent need for heparinization followed by thrombolysis, we are unable to get clotting studies to screen for genetic deficiency. Regardless, patient will need to be on lifelong anticoagulation. Active Problems:   Essential hypertension, benign: Initially markedly elevated secondary severe pain. Improved once thrombolysis done    CAD, NATIVE VESSEL: Stable medical issue during this hospitalization.    HYPERLIPIDEMIA-MIXED   Diastolic CHF, chronic: BNP checked after admission was normal. He received several days of IV fluids and BNP mildly elevated at 850 on day prior to discharge. Patient received several doses of IV Lasix and by day of discharge was felt to be at baseline.    Intractable low back pain: Patient has chronic back pain. He was worked up by neurosurgery during this hospitalization for his acute findings, but these were not felt to be related to his chronic back pain-that ended up being a clot. As mentioned above, patient was started on OxyContin ER 10 mg every 12 hours. He did note some relief, although not completely. Given his heart rate, his body clearly had an improved response to his chronic back pain and would advise his PCP to look into continuing this medication. Patient did site  the cost may be a factor.    Microscopic hematuria: Noted on admission. During patient's workup, were worried about kidney stone. Recommend PCP repeat once patient said several weeks from discharge.   Nausea with vomiting: Second of thrombus. Resolved with thrombolysis.    Severe right groin pain: Referred to the thrombus. Resolved with thrombolysis.    Metabolic acidosis: Noted on initial presentation. Unclear etiology. Salicylate level, lactic acid level all normal. This is finally attributed to starvation ketosis as patient had not eaten for weeks secondary to severe pain.    Obesity (BMI 30-39.9): Patient's BMI meets criteria given BMI greater than 30.    Bradycardia: Following patient's thrombolysis, patient was noted to be bradycardic with a heart rate in the mid 40s, asymptomatic. Felt like this was a combination of patient's coag plus anesthesia plus increased pain control from extended-release Oxy ER. As he became more awake and anesthesia wore off and Coreg was held, heart rate stayed in the high 50s. By day of discharge, resting heart rate was 59. Feeling that he has had so much chronic pain at a high level, but with the intervention of OxyContin extended release continuous, this has dropped his heart rate some. Advised him to hold his Coreg and once we  check his heart rate. When it is consistently staying above 70, to resume this medication. I have also notified his PCP and this may be started upon follow up with his PCP this heart rate allows.  Thrombocytopenia: Patient's platelet count on admission was 132. Following heparinization, his platelet count dropped to 96 and continued to trend down to as low as 71 until heparin was discontinued and he was started on Xarelto. By day of discharge, platelet count was 94. Patient had no active bleeding during his hospitalization. Because of thrombolysis, heparin-induced thrombocytopenia panel was sent but results were inconclusive. Mother may of  been some component of heparin-induced thrombocytopenia, patient was somewhat thrombocytopenic upon admission. I'm unable to get a clear answer as to whether this could be blamed solely on thrombus. In concerned that patient may have some underlying liver disease and early mild thrombocytopenia may be a subtle indicator.  Procedures:  Status post thrombolysis done 7/18  Consultations:  Neurosurgery  Interventional radiology  Discharge Exam: BP 152/76  Pulse 58  Temp(Src) 98.7 F (37.1 C) (Oral)  Resp 18  Ht 5\' 11"  (1.803 m)  Wt 110.904 kg (244 lb 8 oz)  BMI 34.12 kg/m2  SpO2 100%  General: Alert and oriented x3, no acute distress Cardiovascular: Regular rate and rhythm, S1-S2 Respiratory: Clear to auscultation bilaterally  Discharge Instructions You were cared for by a hospitalist during your hospital stay. If you have any questions about your discharge medications or the care you received while you were in the hospital after you are discharged, you can call the unit and asked to speak with the hospitalist on call if the hospitalist that took care of you is not available. Once you are discharged, your primary care physician will handle any further medical issues. Please note that NO REFILLS for any discharge medications will be authorized once you are discharged, as it is imperative that you return to your primary care physician (or establish a relationship with a primary care physician if you do not have one) for your aftercare needs so that they can reassess your need for medications and monitor your lab values.  Discharge Instructions   Diet - low sodium heart healthy    Complete by:  As directed      Discharge instructions    Complete by:  As directed   Wear TED hose x next few months     Increase activity slowly    Complete by:  As directed             Medication List    STOP taking these medications       aspirin 81 MG tablet     carvedilol 12.5 MG tablet    Commonly known as:  COREG      TAKE these medications       doxycycline 100 MG DR capsule  Commonly known as:  DORYX  Take 100 mg by mouth 2 (two) times daily.     fentaNYL 75 MCG/HR  Commonly known as:  DURAGESIC - dosed mcg/hr  Place 75 mcg onto the skin every 3 (three) days.     furosemide 20 MG tablet  Commonly known as:  LASIX  Take 20 mg by mouth daily as needed for fluid.     hydrochlorothiazide 25 MG tablet  Commonly known as:  HYDRODIURIL  Take 1 tablet (25 mg total) by mouth daily.     isosorbide mononitrate 30 MG 24 hr tablet  Commonly known as:  IMDUR  Take  1 tablet (30 mg total) by mouth daily.     lisinopril 40 MG tablet  Commonly known as:  PRINIVIL,ZESTRIL  Take 1 tablet (40 mg total) by mouth daily.     nitroGLYCERIN 0.4 MG SL tablet  Commonly known as:  NITROSTAT  Place 0.4 mg under the tongue every 5 (five) minutes as needed for chest pain.     OxyCODONE 10 mg T12a 12 hr tablet  Commonly known as:  OXYCONTIN  Take 1 tablet (10 mg total) by mouth every 12 (twelve) hours.     oxyCODONE-acetaminophen 10-325 MG per tablet  Commonly known as:  PERCOCET  Take 1 tablet by mouth every 4 (four) hours as needed for pain.     potassium chloride 10 MEQ tablet  Commonly known as:  KLOR-CON 10  Take 1 tablet (10 mEq total) by mouth 2 (two) times daily.     pravastatin 80 MG tablet  Commonly known as:  PRAVACHOL  Take 1 tablet (80 mg total) by mouth daily.     Rivaroxaban 15 MG Tabs tablet  Commonly known as:  XARELTO  Take 1 tablet (15 mg total) by mouth 2 (two) times daily with a meal.     rivaroxaban 20 MG Tabs tablet  Commonly known as:  XARELTO  Take 1 tablet (20 mg total) by mouth daily with supper.  Start taking on:  04/09/2014       No Known Allergies     Follow-up Information   Follow up with Desmond Dike, MD On 04/16/2014. (Monday, Aug 17th @ 10:00AM per April)    Specialty:  Family Medicine   Contact information:   [address] [place] [address] [phone]       Follow up with [facility] T, MD In 4 weeks. (The office wil call you to schedule this appt.  [phone]))    Specialty:  Interventional Radiology   Contact information:   [address] [place] [address] [phone]        The results of significant diagnostics from this hospitalization (including imaging, microbiology, ancillary and laboratory) are listed below for reference.    Significant Diagnostic Studies: Ct Abdomen Pelvis Wo Contrast  03/16/2014   IMPRESSION: IVC expansion inferiorly to the bilateral common femoral veins, highly suspicious for deep venous thrombosis. Bilateral lower extremity Doppler venous ultrasound is recommended for further evaluation. Consider consultation with interventional radiology if thrombectomy is considered. Critical Value/emergent results were called by telephone at the time of interpretation on 03/16/2014 at 5:01 pm to Delice Bison, RN, who verbally acknowledged these results.  No acute intra-abdominal or pelvic pathology otherwise.   Electronically Signed   By: Christiana Pellant M.D.   On: 03/16/2014 17:02   Dg Hip Bilateral W/pelvis  03/15/2014   IMPRESSION: No fracture or dislocation.   Electronically Signed   By: Simonne Come M.D.   On: 03/15/2014 20:25   Ct Lumbar Spine Wo Contrast  03/15/2014     IMPRESSION: 1. No acute osseous findings. 2. Advanced degenerative disc and facet disease with multilevel subluxation. There is multilevel spinal canal and neural foraminal stenosis, as above. Degenerative changes are similar to abdominal CT 03/10/2010.   Electronically Signed   By: Tiburcio Pea M.D.   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 The IVC is completely thrombosed up through the upper level of the  indwelling IVC filter. Diluted tPA was power pulse bilaterally utilizing and Angiojet catheter into clot. Bilateral infusion catheter is were placed and extend from the thigh up through the IVC filter. These will be infused with Tenecteplase at a dose of 0.25 milligrams/hour overnight. Follow-up venography of both lower extremities and the IVC will be performed tomorrow.   Electronically Signed   By: Irish Lack M.D.   On: 03/17/2014 15:21   Ir Rande Lawman F/u Eval Art/ven Final Day (ms)  03/18/2014     IMPRESSION: Dramatic response to therapy with essentially near complete resolution of bilateral iliofemoral DVT and IVC thrombus following 24 hr of catheter directed thrombolytic therapy and additional assistance with mechanical thrombectomy today. 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 Thrombolytic therapy will be discontinued. The patient will remain on bedrest for 4 hr following popliteal sheath removal.   Electronically Signed   By: Irish Lack M.D.   On: 03/18/2014 15:01    Microbiology: Recent Results (from the past 240 hour(s))  URINE CULTURE     Status: None   Collection Time    03/15/14 10:52 AM      Result Value Ref Range Status   Specimen Description URINE, CLEAN CATCH   Final   Special Requests NONE   Final   Culture  Setup Time     Final   Value: 03/15/2014 18:26     Performed at [facility] Count     Final   Value: NO GROWTH     Performed at [facility]   Culture     Final   Value: NO GROWTH     Performed at [facility]   Report Status 03/16/2014 FINAL   Final  MRSA PCR SCREENING     Status: None   Collection Time    03/17/14  3:27 PM      Result Value Ref Range Status   MRSA by PCR NEGATIVE  NEGATIVE Final   Comment:            The GeneXpert MRSA Assay (FDA     approved for NASAL specimens     only), is one component of a     comprehensive MRSA colonization     surveillance  program. It is not     intended to diagnose MRSA     infection nor to guide or     monitor treatment for     MRSA infections.     Labs: Basic Metabolic Panel:  Recent Labs Lab 03/15/14 0137 03/16/14 0540 03/17/14 0048 03/18/14 0223 03/20/14 0521  NA 133* 134* 129* 135* 134*  K 4.1 4.7 5.0 4.9 4.5  CL 92* 93* 90* 97 97  CO2 21 24 23 22 22   GLUCOSE 146* 154* 148* 139* 105*  BUN 21 25* 45* 48* 22  CREATININE 0.81 0.85 1.20 0.97 0.58  CALCIUM 9.5 9.7 9.6 8.7 8.8   Liver Function Tests:  Recent Labs Lab 03/15/14 0137  AST 24  ALT 19  ALKPHOS 71  BILITOT 1.1  PROT 8.3  ALBUMIN 4.4   No results found for this basename: LIPASE, AMYLASE,  in the last 168 hours No results found for this basename: AMMONIA,  in the last 168 hours CBC:  Recent Labs Lab 03/15/14 0137  03/18/14 0528 03/18/14 1411 03/18/14 2045 03/19/14 0934 03/20/14 4540  WBC 12.6*  < > 16.0* 13.1* 12.9* 12.2* 12.3*  NEUTROABS 10.4*  --   --   --   --   --   --   HGB 12.2*  < > 9.8* 9.6* 9.8* 10.7* 10.5*  HCT 38.0*  < > 30.3* 29.0* 30.0* 33.2* 31.8*  MCV 90.7  < > 89.4 89.5 89.3 91.5 90.3  PLT 129*  < > 75* 71* 74* 87* 94*  < > = values in this interval not displayed. Cardiac Enzymes:  Recent Labs Lab 03/16/14 1355  TROPONINI <0.30   BNP: BNP (last 3 results)  Recent Labs  03/17/14 1400 03/19/14 0934  PROBNP 98.8 849.5*   CBG: No results found for this basename: GLUCAP,  in the last 168 hours     Signed:  Hollice Espy  [facility] 03/21/2014, 6:52 AM

## 2014-03-22 ENCOUNTER — Other Ambulatory Visit (HOSPITAL_COMMUNITY): Payer: Self-pay | Admitting: Interventional Radiology

## 2014-03-22 DIAGNOSIS — I82403 Acute embolism and thrombosis of unspecified deep veins of lower extremity, bilateral: Secondary | ICD-10-CM

## 2014-03-22 DIAGNOSIS — I8290 Acute embolism and thrombosis of unspecified vein: Secondary | ICD-10-CM

## 2014-04-17 ENCOUNTER — Ambulatory Visit
Admission: RE | Admit: 2014-04-17 | Discharge: 2014-04-17 | Disposition: A | Discharge status: Home / Self Care | Payer: Medicare Other | Source: Ambulatory Visit | Attending: Interventional Radiology | Admitting: Interventional Radiology

## 2014-04-17 DIAGNOSIS — I8290 Acute embolism and thrombosis of unspecified vein: Secondary | ICD-10-CM

## 2014-04-17 DIAGNOSIS — I82403 Acute embolism and thrombosis of unspecified deep veins of lower extremity, bilateral: Secondary | ICD-10-CM

## 2014-05-31 DEATH — deceased
